# Patient Record
Sex: Male | Born: 1960 | Race: White | Hispanic: No | Smoking: Former smoker
Health system: Southern US, Community
[De-identification: ages and names within clinical notes are randomized; demographics above are authoritative.]

## PROBLEM LIST (undated history)

## (undated) DIAGNOSIS — R351 Nocturia: Secondary | ICD-10-CM

## (undated) DIAGNOSIS — G4730 Sleep apnea, unspecified: Secondary | ICD-10-CM

## (undated) DIAGNOSIS — E785 Hyperlipidemia, unspecified: Secondary | ICD-10-CM

## (undated) DIAGNOSIS — G8929 Other chronic pain: Secondary | ICD-10-CM

## (undated) DIAGNOSIS — M545 Low back pain, unspecified: Secondary | ICD-10-CM

## (undated) DIAGNOSIS — R310 Gross hematuria: Secondary | ICD-10-CM

## (undated) DIAGNOSIS — M1990 Unspecified osteoarthritis, unspecified site: Secondary | ICD-10-CM

## (undated) DIAGNOSIS — H547 Unspecified visual loss: Secondary | ICD-10-CM

## (undated) DIAGNOSIS — M549 Dorsalgia, unspecified: Secondary | ICD-10-CM

## (undated) DIAGNOSIS — N200 Calculus of kidney: Secondary | ICD-10-CM

## (undated) DIAGNOSIS — U099 Post covid-19 condition, unspecified: Secondary | ICD-10-CM

## (undated) DIAGNOSIS — R438 Other disturbances of smell and taste: Secondary | ICD-10-CM

## (undated) DIAGNOSIS — H8113 Benign paroxysmal vertigo, bilateral: Secondary | ICD-10-CM

## (undated) DIAGNOSIS — E782 Mixed hyperlipidemia: Secondary | ICD-10-CM

## (undated) DIAGNOSIS — I10 Essential (primary) hypertension: Secondary | ICD-10-CM

## (undated) DIAGNOSIS — R0602 Shortness of breath: Secondary | ICD-10-CM

## (undated) DIAGNOSIS — Z87442 Personal history of urinary calculi: Secondary | ICD-10-CM

## (undated) DIAGNOSIS — R9431 Abnormal electrocardiogram [ECG] [EKG]: Secondary | ICD-10-CM

## (undated) HISTORY — PX: KNEE SURGERY: SHX244

## (undated) HISTORY — PX: LITHOTRIPSY: SUR834

## (undated) HISTORY — DX: Unspecified visual loss: H54.7

## (undated) HISTORY — DX: Abnormal electrocardiogram (ECG) (EKG): R94.31

## (undated) HISTORY — PX: APPENDECTOMY: SHX54

## (undated) HISTORY — DX: Calculus of kidney: N20.0

## (undated) HISTORY — PX: WISDOM TOOTH EXTRACTION: SHX21

## (undated) HISTORY — DX: Hyperlipidemia, unspecified: E78.5

---

## 2005-03-13 ENCOUNTER — Ambulatory Visit: Payer: Self-pay | Admitting: Internal Medicine

## 2005-03-20 ENCOUNTER — Ambulatory Visit: Payer: Self-pay | Admitting: Internal Medicine

## 2005-04-06 ENCOUNTER — Ambulatory Visit: Payer: Self-pay | Admitting: Pulmonary Disease

## 2005-04-22 ENCOUNTER — Ambulatory Visit (HOSPITAL_BASED_OUTPATIENT_CLINIC_OR_DEPARTMENT_OTHER): Admission: RE | Admit: 2005-04-22 | Discharge: 2005-04-22 | Payer: Self-pay | Admitting: Pulmonary Disease

## 2005-04-27 ENCOUNTER — Ambulatory Visit: Payer: Self-pay | Admitting: Pulmonary Disease

## 2005-05-07 ENCOUNTER — Ambulatory Visit: Payer: Self-pay | Admitting: Pulmonary Disease

## 2005-08-06 DIAGNOSIS — Z8679 Personal history of other diseases of the circulatory system: Secondary | ICD-10-CM

## 2005-08-06 DIAGNOSIS — G4730 Sleep apnea, unspecified: Secondary | ICD-10-CM

## 2005-08-06 DIAGNOSIS — R9431 Abnormal electrocardiogram [ECG] [EKG]: Secondary | ICD-10-CM

## 2005-08-06 HISTORY — DX: Sleep apnea, unspecified: G47.30

## 2005-08-06 HISTORY — DX: Abnormal electrocardiogram (ECG) (EKG): R94.31

## 2005-08-06 HISTORY — DX: Personal history of other diseases of the circulatory system: Z86.79

## 2005-08-06 HISTORY — PX: CARDIAC CATHETERIZATION: SHX172

## 2006-07-27 ENCOUNTER — Ambulatory Visit: Payer: Self-pay | Admitting: Cardiology

## 2006-07-27 ENCOUNTER — Inpatient Hospital Stay (HOSPITAL_COMMUNITY): Admission: EM | Admit: 2006-07-27 | Discharge: 2006-07-27 | Payer: Self-pay | Admitting: Emergency Medicine

## 2006-08-07 ENCOUNTER — Ambulatory Visit: Payer: Self-pay | Admitting: Internal Medicine

## 2006-08-07 ENCOUNTER — Ambulatory Visit: Payer: Self-pay

## 2006-08-16 ENCOUNTER — Ambulatory Visit: Payer: Self-pay | Admitting: Internal Medicine

## 2006-08-19 ENCOUNTER — Ambulatory Visit: Payer: Self-pay | Admitting: Internal Medicine

## 2006-08-19 LAB — CONVERTED CEMR LAB
BUN: 12 mg/dL (ref 6–23)
Creatinine, Ser: 1.1 mg/dL (ref 0.4–1.5)
GFR calc non Af Amer: 78 mL/min
Glomerular Filtration Rate, Af Am: 94 mL/min/{1.73_m2}
INR: 1 (ref 0.9–2.0)
MCHC: 34.6 g/dL (ref 30.0–36.0)
MCV: 90.9 fL (ref 78.0–100.0)
Prothrombin Time: 12.8 s (ref 10.0–14.0)
RBC: 4.69 M/uL (ref 4.22–5.81)
RDW: 11.7 % (ref 11.5–14.6)
aPTT: 37 s — ABNORMAL HIGH (ref 26.5–36.5)

## 2006-08-22 ENCOUNTER — Ambulatory Visit (HOSPITAL_COMMUNITY): Admission: RE | Admit: 2006-08-22 | Discharge: 2006-08-22 | Payer: Self-pay | Admitting: Internal Medicine

## 2006-08-22 ENCOUNTER — Ambulatory Visit: Payer: Self-pay | Admitting: Internal Medicine

## 2006-08-23 ENCOUNTER — Encounter: Payer: Self-pay | Admitting: Internal Medicine

## 2006-08-23 ENCOUNTER — Inpatient Hospital Stay (HOSPITAL_BASED_OUTPATIENT_CLINIC_OR_DEPARTMENT_OTHER): Admission: RE | Admit: 2006-08-23 | Discharge: 2006-08-23 | Payer: Self-pay | Admitting: Internal Medicine

## 2006-08-23 HISTORY — PX: CARDIAC CATHETERIZATION: SHX172

## 2006-08-29 ENCOUNTER — Ambulatory Visit: Payer: Self-pay | Admitting: Pulmonary Disease

## 2007-11-29 ENCOUNTER — Emergency Department (HOSPITAL_COMMUNITY): Admission: EM | Admit: 2007-11-29 | Discharge: 2007-11-29 | Payer: Self-pay | Admitting: Emergency Medicine

## 2008-05-15 ENCOUNTER — Emergency Department (HOSPITAL_COMMUNITY): Admission: EM | Admit: 2008-05-15 | Discharge: 2008-05-15 | Payer: Self-pay | Admitting: Emergency Medicine

## 2008-09-27 ENCOUNTER — Telehealth: Payer: Self-pay | Admitting: Internal Medicine

## 2008-09-29 ENCOUNTER — Ambulatory Visit (HOSPITAL_BASED_OUTPATIENT_CLINIC_OR_DEPARTMENT_OTHER): Admission: RE | Admit: 2008-09-29 | Discharge: 2008-09-29 | Payer: Self-pay | Admitting: Internal Medicine

## 2008-09-29 ENCOUNTER — Telehealth: Payer: Self-pay | Admitting: Internal Medicine

## 2008-09-29 ENCOUNTER — Ambulatory Visit: Payer: Self-pay | Admitting: Internal Medicine

## 2008-09-29 ENCOUNTER — Ambulatory Visit: Payer: Self-pay | Admitting: Diagnostic Radiology

## 2008-09-29 DIAGNOSIS — E785 Hyperlipidemia, unspecified: Secondary | ICD-10-CM | POA: Insufficient documentation

## 2008-09-29 DIAGNOSIS — R51 Headache: Secondary | ICD-10-CM | POA: Insufficient documentation

## 2008-09-29 DIAGNOSIS — G4733 Obstructive sleep apnea (adult) (pediatric): Secondary | ICD-10-CM | POA: Insufficient documentation

## 2008-09-29 DIAGNOSIS — R519 Headache, unspecified: Secondary | ICD-10-CM | POA: Insufficient documentation

## 2008-09-30 ENCOUNTER — Ambulatory Visit: Payer: Self-pay | Admitting: Family Medicine

## 2008-09-30 LAB — CONVERTED CEMR LAB
ALT: 59 units/L — ABNORMAL HIGH (ref 0–53)
AST: 40 units/L — ABNORMAL HIGH (ref 0–37)
BUN: 16 mg/dL (ref 6–23)
Basophils Absolute: 0 10*3/uL (ref 0.0–0.1)
Calcium: 9.1 mg/dL (ref 8.4–10.5)
Creatinine, Ser: 1.1 mg/dL (ref 0.4–1.5)
Eosinophils Relative: 6.6 % — ABNORMAL HIGH (ref 0.0–5.0)
GFR calc Af Amer: 93 mL/min
GFR calc non Af Amer: 77 mL/min
Glucose, Bld: 85 mg/dL (ref 70–99)
HDL: 31.4 mg/dL — ABNORMAL LOW (ref >39.0)
Lymphocytes Relative: 33.9 % (ref 12.0–46.0)
MCHC: 34.5 g/dL (ref 30.0–36.0)
Monocytes Relative: 7.3 % (ref 3.0–12.0)
Neutro Abs: 2.5 10*3/uL (ref 1.4–7.7)
Neutrophils Relative %: 51.5 % (ref 43.0–77.0)
Platelets: 189 10*3/uL (ref 150–400)
RBC: 4.5 M/uL (ref 4.22–5.81)
TSH: 1.72 microintl units/mL (ref 0.35–5.50)
Triglycerides: 150 mg/dL — ABNORMAL HIGH (ref 0–149)

## 2008-10-01 ENCOUNTER — Encounter: Payer: Self-pay | Admitting: Internal Medicine

## 2009-12-18 ENCOUNTER — Emergency Department (HOSPITAL_COMMUNITY): Admission: EM | Admit: 2009-12-18 | Discharge: 2009-12-18 | Payer: Self-pay | Admitting: Emergency Medicine

## 2010-12-22 NOTE — H&P (Signed)
NAME:  ARMANY, MANO NO.:  [id number]   MEDICAL RECORD NO.:  [id number]          PATIENT TYPE:  EMS   LOCATION:  MAJO                         FACILITY:  [facility]   PHYSICIAN:  Learta Codding, MD,FACC DATE OF BIRTH:  12-Dec-1960   DATE OF ADMISSION:  07/27/2006  DATE OF DISCHARGE:                              HISTORY & PHYSICAL   CHIEF COMPLAINT:  Chest pain.   HISTORY OF PRESENT ILLNESS:  Mr. Mells is a 50 year old Caucasian male  with a positive family history for premature coronary artery disease,  who presents with 4-5 hours of sharp substernal chest pain.  The patient  denies any history of chest pain.  Tonight, he was arguing with his  girlfriend when he developed 3/10 sharp chest pain at the left sternal  border with no radiation.  He denied any shortness of breath, dyspnea on  exertion or diaphoresis.  The pain waxed and waned every hour for  approximately 4 or 5 hours, prompting him to present to the emergency  room.  The pain resolved spontaneously and was not affected by the IV  nitroglycerin.  The patient also was started on heparin.  He denies any  orthopnea or paroxysmal nocturnal dyspnea.   PAST MEDICAL HISTORY:  1. Sleep apnea.  2. COPD.  3. History of palpitations.   ALLERGIES:  No known drug allergies.   MEDICATIONS:  Vitamins.   SOCIAL HISTORY:  The patient lives in [place].  Occupation:  He is an  Art gallery manager.  He has a girlfriend.  He quit smoking in 1987, having smoked  1 pack a day.   FAMILY HISTORY:  Notable for mother who expired of CHF and a brother who  requires a stent placement at age 52.   REVIEW OF SYSTEMS:  Notable for mild sleep apnea.  The rest of the 12  review of systems were reviewed and were negative.   PHYSICAL EXAM:  VITAL SIGNS:  Temperature is 97.6, pulse 66, respiratory  rate of 16 and blood pressure 116/58, saturating 97% on room air.  GENERAL:  The patient is awake, alert and oriented x3, in no acute   distress.  HEENT:  Normocephalic, atraumatic.  Pupils are equal, round and reactive  to light.  Extraocular movements are intact.  NECK:  No JVD.  No carotid bruit.  CARDIOVASCULAR:  Regular rhythm.  Normal rate.  No murmurs, rubs, or  gallops.  LUNGS:  Clear to auscultation bilaterally with mild diffusely decreased  breath sounds secondary to body habitus.  ABDOMEN:  Positive bowel sounds.  Soft, nontender and non-distended.  EXTREMITIES:  No cyanosis, clubbing or edema.  There are about 1+ distal  pulses.  PSYCHIATRIC:  Normal affect.  MUSCULOSKELETAL:  No significant effusion or tenderness.  NEUROLOGIC:  Cranial nerves II-XII are grossly intact with no focal  musculoskeletal or sensory deficits.   LABORATORY AND ACCESSORY CLINICAL DATA:  Chest x-ray demonstrates no  cardiopulmonary process.   EKG demonstrates normal sinus rhythm with a heart rate of 64, no ST-T  wave changes.   Labs demonstrate a white count of 4.9, a hemoglobin  of 14.4, creatinine  of 1.1, a troponin of less than 0.05 x2.   ASSESSMENT AND PLAN:  This is a 50 year old Caucasian male with a  positive family history, but with atypical chest pain.   1. We will rule the patient out for a myocardial infarction and start      on low-dose beta blocker.  2. We will consider stress test, either as an inpatient versus      outpatient.      Reginia Forts, MD   Electronically Signed     ______________________________  Learta Codding, MD,FACC    RA/MEDQ  D:  07/27/2006  T:  07/27/2006  Job:  [id number]

## 2010-12-22 NOTE — Cardiovascular Report (Signed)
NAME:  [name], [name] NO.:  [id number]   MEDICAL RECORD NO.:  [id number]          PATIENT TYPE:  OIB   LOCATION:  1965                         FACILITY:  [facility]   PHYSICIAN:  Bevelyn Buckles. Bensimhon, MDDATE OF BIRTH:  09-26-1960   DATE OF PROCEDURE:  08/23/2006  DATE OF DISCHARGE:                            CARDIAC CATHETERIZATION   PRIMARY CARE PHYSICIAN:  Barbette Hair. Artist Pais, DO.   CARDIOLOGISTBevelyn Buckles. Bensimhon, M.D.   PATIENT IDENTIFICATION:  Nicholas Horn is a 50 year old male with a history  of obesity and hyperlipidemia.  He had an episode of chest pain a few  weeks ago.  He then underwent an exercise treadmill test which showed an  EF of 58% with normal perfusion.  However, there was significant ST  depression in inferolateral leads.  Given his symptoms and positive EKG,  discussed with him the possibility proceeding with heart catheterization  versus just continue with medical therapy and with the thought that this  might be a false positive.  He has elected to go with catheterization.  This was performed in the outpatient lab.   PROCEDURES PERFORMED:  1. Selective coronary angiography.  2. Left heart cath.  3. Left ventriculogram.   DESCRIPTION OF PROCEDURE:  Risks and benefits were explained.  Consent  was signed and placed on the chart.  A 4-French arterial sheath was  placed in the right femoral artery.  Standard catheters including a JL-  4, 3-DRC, and angled pigtail were used for procedure.  All catheter  exchanges made over wire.  There were no apparent complications.   Central aortic pressure was 113/74 with a mean of 93.  LV pressure 119/5  with an EDP of 16.  There was no aortic stenosis on pullback.   Left main:  Normal.   LAD:  A long vessel wrapping the apex, gave off large branching proximal  diagonal which was normal.   Left circumflex:  Made up primarily of a large branching OM-1.  It was  angiographically normal.   Right coronary was a  large dominant vessel.  It gave off small to  moderate size PDA and three posterolateral branches.  It was  angiographically normal.   LEFT VENTRICULOGRAM:  Done in the RAO position, showed an EF of 60%, no  wall motion abnormalities, no mitral regurgitation.   ASSESSMENT:  1. Normal coronary arteries.  2. Normal left ventricular function.  3. False positive exercise EKG.   PLAN:  We will continue with risk factor management.      Bevelyn Buckles. Bensimhon, MD  Electronically Signed     DRB/MEDQ  D:  08/23/2006  T:  08/23/2006  Job:  [id number]   cc:   Barbette Hair. Artist Pais, DO

## 2010-12-22 NOTE — Procedures (Signed)
NAME:  Nicholas Horn, Nicholas Horn NO.:  [id number]   MEDICAL RECORD NO.:  [id number]          PATIENT TYPE:  OUT   LOCATION:  SLEEP CENTER                 FACILITY:  [facility]   PHYSICIAN:  Marcelyn Bruins, M.D. [facility] DATE OF BIRTH:  05/12/1961   DATE OF STUDY:  04/22/2005                              NOCTURNAL POLYSOMNOGRAM   INDICATION FOR STUDY:  Hypersomnia with sleep apnea.   EPWORTH SLEEPINESS SCORE:  10.   SLEEP ARCHITECTURE:  The patient had a total sleep time of 382 minutes with  decreased slow wave sleep but adequate REM.  Sleep onset latency was normal  and REM onset was fairly rapid at 54 minutes. Sleep efficiency was 93%.   RESPIRATORY DATA:  The patient was found to have a respiratory disturbance  index of 2.7 events per hour which clearly were worse during supine and also  REM. There was loud snoring with 143 nonspecific arousals. This could be  consistent with the upper airway resistant syndrome. Clinical correlation is  suggested.   OXYGEN DATA:  The patient had O2 desaturation as low as 87% associated with  his obstructive events.   CARDIAC DATA:  There were no clinically significant cardiac arrhythmias.   MOVEMENT-PARASOMNIA:  The patient was found to have 72 leg jerks with three  per hour resulting in arousal or awakening. Clinical correlation is  suggested.   IMPRESSIONS-RECOMMENDATIONS:  1.  Small numbers of obstructive events which do not meet the respiratory      disturbance index criteria for the obstructive sleep apnea syndrome.      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  2.  Moderate numbers of leg jerks with mild to moderate sleep disruption.      Clinical correlation is suggested.           ______________________________  Marcelyn Bruins, M.D. [facility]  Diplomate, American Board of Sleep  Medicine     KC/MEDQ  D:  04/27/2005 11:09:42  T:   04/27/2005 21:14:41  Job:  [id number]

## 2010-12-22 NOTE — Assessment & Plan Note (Signed)
[facility]                            CARDIOLOGY OFFICE NOTE   INMAN, FETTIG                        MRN:          [id number]  DATE:08/16/2006                            DOB:          1961/02/12    PRIMARY CARE PHYSICIAN:  Barbette Hair. Artist Pais, DO.   PATIENT IDENTIFICATION:  Nicholas Horn is a 50 year old male with a family  history of coronary artery disease who presents for follow up to discuss  the results of his stress testing.   PROBLEM LIST:  1. Episode of chest pain evaluated in the hospital, ruled out with      serial cardiac markers.      a.     Exercise treadmill test August 07, 2006.  The patient walked       for 10 minutes on a standard Bruce protocol.  There was a       hypertensive response to exercise.  No chest pain.  EF was 58%       with no perfusion abnormalities; however, there was significant ST       depression inferolaterally.  2. Palpitations.      a.     A 48-hour Holter monitor January of 2008.  It shows normal       sinus rhythm with no arrhythmias.  3. Obesity.  4. Hyperlipidemia.   CURRENT MEDICATIONS:  1. Simvastatin 20 a day.  2. Toprol XL 25 a day.  3. Multivitamin.   INTERVAL HISTORY:  Nicholas Horn returns today for routine followup.  He  tells me that he has done quite well since discharge.  No further chest  pain.  He ambulates without any difficulty.  He said that about six  months ago he was working out religiously on an Manufacturing engineer but  due to his work schedule he is not able to do this.  He has never had  exertional symptoms.   PHYSICAL EXAMINATION:  Well-appearing, no acute distress.  Ambulates  around the clinic without any respiratory difficulty.  No chest pain.  Blood pressure is 112/64, heart rate 68.  Weight is 298.  HEENT:  Sclerae anicteric.  EOMI.  There is no xanthelasma.  Oropharynx  is clear.  Moist mucus membranes.  Neck is supple.  No JVD.  Carotids  are 2+ bilaterally without any  bruits.  There is no lymphadenopathy or  thyromegaly.  CARDIAC:  Regular rate and rhythm.  No murmurs, gallops, or rubs.  LUNGS:  Clear.  ABDOMEN:  Obese, soft, nontender, nondistended.  No hepatosplenomegaly,  no bruits, no masses.  EXTREMITIES:  Warm with no clubbing, cyanosis, or edema.  Good distal  pulses.  NEUROLOGICAL:  Alert and oriented x3.  Cranial nerves II through XII are  intact.  Moves all four extremities without difficulty.   ASSESSMENT/PLAN:  Chest pain:  This is somewhat of a diagnostic dilemma:  Clinically, I do not think he has significant angina.  He has an  excellent exercise tolerance on his stress test without any exertional  chest pain and perfusing imaging is normal.  However, he does have  positive EKG.  This likely represents either false-positive EKG or  balanced ischemia on his perfusion scan.  Given his age and lack of risk  factors, I think it is probably the former.  The only co-founding factor  is that he does have a brother that has premature coronary artery  disease, though this is in the setting of significant tobacco and drug  use.  We have discussed the possibilities of just proceeding with  medical therapy, catheterization or cardiac CT.  He would like to  consider this.  We have given him some information on catheterization  and he will get back to [place] on Monday.  I told him that my gut feeling  was that his arteries were likely okay.   DISPOSITION:  Pending his decision regarding catheterization.  I did  recommend that he would be safe to resume exercise program, although to  begin back up a low level.     Nicholas Buckles. Bensimhon, MD  Electronically Signed    DRB/MedQ  DD: 08/16/2006  DT: 08/17/2006  Job #: [id number]

## 2010-12-22 NOTE — Discharge Summary (Signed)
Nicholas Horn, Nicholas Horn NO.:  [id number]   MEDICAL RECORD NO.:  [id number]          PATIENT TYPE:  INP   LOCATION:  2028                         FACILITY:  [facility]   PHYSICIAN:  Bevelyn Buckles. Bensimhon, MDDATE OF BIRTH:  11/10/1960   DATE OF ADMISSION:  07/27/2006  DATE OF DISCHARGE:  07/27/2006                               DISCHARGE SUMMARY   CARDIOLOGIST:  He is new to Dr. Arvilla Meres.   PRIMARY CARE PHYSICIAN:  Barbette Hair. Artist Pais, M.D.   REASON FOR ADMISSION:  Chest pain.   DISCHARGE DIAGNOSES:  1. Chest pain, etiology unclear.  2. Hypertension.  3. Hyperlipidemia.  4. Obesity.  5. Palpitations.   HISTORY:  Nicholas Horn is a 50 year old  male patient with a family history  of premature coronary disease who presented with 4-5 hours of substernal  chest pain.  He developed more chest pain with arguing with his  girlfriend.  There was no shortness of breath or diaphoresis.  He  decided to come to the emergency room for further evaluation.   HOSPITAL COURSE:  The patient was admitted for observation and treated  with nitroglycerin and heparin.  The initial cardiac markers were  negative.  Followup cardiac markers are still pending at time of this  dictation.  As long as these are negative, he will be discharged home  later today.  Dr. Gala Romney saw the patient on December 22.  The patient  was having no further chest pain.  Dr. Gala Romney, as noted above, felt  that if the patient's cardiac markers remained negative, he could be  discharged home.  He will need workup for his palpitations and chest  pain with an outpatient stress Myoview study as well as a 48-hour Holter  monitor and then follow up with Dr. Gala Romney.   LABORATORY AND ANCILLARY DATA:  White count was 4900, hemoglobin 14.4,  hematocrit 42, platelet 199,000. INR 0.9. D-dimer 1.21. Sodium 140,  potassium 3.6, BUN 15, creatinine 1.0, glucose 98. CK-MB 1.4, troponin I  less than 0.01. Total cholesterol  234, triglycerides 92, HDL 36, LDL  180.   Chest x-ray: No acute cardiopulmonary disease.   DISCHARGE MEDICATIONS:  1. Aspirin 0 mg daily.  2. Zocor 20 mg nightly.  3. Toprol XL 25 mg daily.  4. Multivitamins.  5. Nitroglycerin p.r.n. chest pain.   DIET:  Low-fat, low-sodium diet.   ACTIVITIES:  Increase activity slowly.   FOLLOWUP:  1. The patient be set up for a stress Myoview study. The office will      contact him with that appointment.  2  He will be set up for an outpatient 48-hour Holter monitor,and the  office will contact him with an appointment.  1. He will see Dr. Gala Romney in the next 2-4 weeks, and the office      will contact him with appointment.   Total physician and PA time greater than 30 minutes on this discharge.      Tereso Newcomer, PA-C      Bevelyn Buckles. Bensimhon, MD  Electronically Signed    SW/MEDQ  D:  07/27/2006  T:  07/28/2006  Job:  [id number]   cc:   Barbette Hair. Artist Pais, DO

## 2012-05-23 ENCOUNTER — Encounter: Payer: Self-pay | Admitting: Medical

## 2012-05-23 ENCOUNTER — Ambulatory Visit (INDEPENDENT_AMBULATORY_CARE_PROVIDER_SITE_OTHER): Payer: BC Managed Care – PPO | Admitting: Medical

## 2012-05-23 ENCOUNTER — Telehealth: Payer: Self-pay | Admitting: Medical

## 2012-05-23 VITALS — BP 122/82 | HR 84 | Temp 97.6°F | Resp 16 | Ht 75.0 in | Wt 281.0 lb

## 2012-05-23 DIAGNOSIS — R42 Dizziness and giddiness: Secondary | ICD-10-CM

## 2012-05-23 DIAGNOSIS — H8110 Benign paroxysmal vertigo, unspecified ear: Secondary | ICD-10-CM

## 2012-05-23 DIAGNOSIS — B351 Tinea unguium: Secondary | ICD-10-CM

## 2012-05-23 LAB — BASIC METABOLIC PANEL
BUN: 12 mg/dL (ref 6–23)
CO2: 27 mEq/L (ref 19–32)
Calcium: 9.1 mg/dL (ref 8.4–10.5)
Chloride: 106 mEq/L (ref 96–112)

## 2012-05-23 LAB — CBC WITH DIFFERENTIAL/PLATELET
Basophils Relative: 1 % (ref 0–1)
HCT: 39 % (ref 39.0–52.0)
Hemoglobin: 13.9 g/dL (ref 13.0–17.0)
Monocytes Absolute: 0.6 10*3/uL (ref 0.1–1.0)
Monocytes Relative: 9 % (ref 3–12)
Neutro Abs: 3.1 10*3/uL (ref 1.7–7.7)
Neutrophils Relative %: 52 % (ref 43–77)
WBC: 6 10*3/uL (ref 4.0–10.5)

## 2012-05-23 MED ORDER — MECLIZINE HCL 25 MG PO TABS: 25.0000 mg | ORAL_TABLET | Freq: Three times a day (TID) | ORAL | Status: DC | PRN

## 2012-05-23 MED ORDER — TERBINAFINE HCL 250 MG PO TABS: 250.0000 mg | ORAL_TABLET | Freq: Every day | ORAL | Status: DC

## 2012-05-23 NOTE — Patient Instructions (Signed)
Benign Positional Vertigo Vertigo means you feel like you or your surroundings are moving when they are not. Benign positional vertigo is the most common form of vertigo. Benign means that the cause of your condition is not serious. Benign positional vertigo is more common in older adults. CAUSES  Benign positional vertigo is the result of an upset in the labyrinth system. This is an area in the middle ear that helps control your balance. This may be caused by a viral infection, head injury, or repetitive motion. However, often no specific cause is found. SYMPTOMS  Symptoms of benign positional vertigo occur when you move your head or eyes in different directions. Some of the symptoms may include:  Loss of balance and falls.  Vomiting.  Blurred vision.  Dizziness.  Nausea.  Involuntary eye movements (nystagmus). DIAGNOSIS  Benign positional vertigo is usually diagnosed by physical exam. If the specific cause of your benign positional vertigo is unknown, your caregiver may perform imaging tests, such as magnetic resonance imaging (MRI) or computed tomography (CT). TREATMENT  Your caregiver may recommend movements or procedures to correct the benign positional vertigo. Medicines such as meclizine, benzodiazepines, and medicines for nausea may be used to treat your symptoms. In rare cases, if your symptoms are caused by certain conditions that affect the inner ear, you may need surgery. HOME CARE INSTRUCTIONS   Follow your caregiver's instructions.  Move slowly. Do not make sudden body or head movements.  Avoid driving.  Avoid operating heavy machinery.  Avoid performing any tasks that would be dangerous to you or others during a vertigo episode.  Drink enough fluids to keep your urine clear or pale yellow. SEEK IMMEDIATE MEDICAL CARE IF:   You develop problems with walking, weakness, numbness, or using your arms, hands, or legs.  You have difficulty speaking.  You develop  severe headaches.  Your nausea or vomiting continues or gets worse.  You develop visual changes.  Your family or friends notice any behavioral changes.  Your condition gets worse.  You have a fever.  You develop a stiff neck or sensitivity to light. MAKE SURE YOU:   Understand these instructions.  Will watch your condition.  Will get help right away if you are not doing well or get worse. Document Released: 04/30/2006 Document Revised: 10/15/2011 Document Reviewed: 04/12/2011 [facility] Patient Information 2013 [place], [place].   Ringworm, Nail A fungal infection of the nail (tinea unguium/onychomycosis) is common. It is common as the visible part of the nail is composed of dead cells which have no blood supply to help prevent infection. It occurs because fungi are everywhere and will pick any opportunity to grow on any dead material. Because nails are very slow growing they require up to 2 years of treatment with anti-fungal medications. The entire nail back to the base is infected. This includes approximately  of the nail which you cannot see. If your caregiver has prescribed a medication by mouth, take it every day and as directed. No progress will be seen for at least 6 to 9 months. Do not be disappointed! Because fungi live on dead cells with little or no exposure to blood supply, medication delivery to the infection is slow; thus the cure is slow. It is also why you can observe no progress in the first 6 months. The nail becoming cured is the base of the nail, as it has the blood supply. Topical medication such as creams and ointments are usually not effective. Important in successful treatment of  nail fungus is closely following the medication regimen that your doctor prescribes. Sometimes you and your caregiver may elect to speed up this process by surgical removal of all the nails. Even this may still require 6 to 9 months of additional oral medications. See your caregiver as  directed. Remember there will be no visible improvement for at least 6 months. See your caregiver sooner if other signs of infection (redness and swelling) develop. Document Released: 07/20/2000 Document Revised: 10/15/2011 Document Reviewed: 09/28/2008 [facility] Patient Information 2013 [place], [place].

## 2012-05-23 NOTE — Progress Notes (Signed)
Subjective: Here as a new patient today.   He gives blood routinely for donation, and they normally take double units due to him being O- blood type.   Usually doesn't have a problem with this, but 2 wk ago gave blood.  The next morning felt dizzy and he has remained dizzy since.  He notes that his equilibrium feels off.  Sometimes room seems to spin, sometimes just feels off balance.   Denies syncope feeling.   He exercises regularly, goes to the gym, took a week off after the dizzy spell, but this week he felt fine during exercise without dizziness.  Just felt more fatigue than usual.  Also c/o left great toenail fungus.  Had thickened yellow nail for long time.   No other nail issues.  Tried OTC creams with no success.  In general he has lost a lot of weight in the last year through diet and exercise.   Was 320 lb a year ago.   Past Medical History  Diagnosis Date  . Vision impairment     wears reading glasses  . EKG abnormality 2007    stress test and cardiac evaluation - [facility]    ROS Gen: no fever, chills, sweats, no recent weight changes. Skin: no rash HEENT: negative Heart: no CP, palpitation, no edema Lungs: negative GI: negative, no pain, NVD, no blood, no constipation GU: negative Neuro: mild low intensity headaches the last few weeks.  no numbness, tingling, weakness, slurred speech, vision changes.   Objective:   Physical Exam  Filed Vitals:   05/23/12 0945  BP: 122/82  Pulse: 84  Temp: 97.6 F (36.4 C)  Resp: 16    General appearance: alert, no distress, WD/WN Skin: left great toenail with yellowish coloration, cut down to 3/4 of normal length, thickened crusting nail, rest of nails appear normal HEENT: normocephalic, sclerae anicteric, PERRLA, EOMi, nares patent, no discharge or erythema, pharynx normal Oral cavity: MMM, no lesions Neck: supple, no lymphadenopathy, no thyromegaly, no masses, no bruits Heart: RRR, normal S1, S2, no murmurs Lungs:  CTA bilaterally, no wheezes, rhonchi, or rales Extremities: no edema, no cyanosis, no clubbing Pulses: 2+ symmetric Neurological: alert, oriented x 3, CN2-12 intact, strength normal upper extremities and lower extremities, sensation normal throughout, DTRs 2+ throughout, no cerebellar signs, gait normal  Assessment and Plan :    Encounter Diagnoses  Name Primary?  Marland Kitchen BPPV (benign paroxysmal positional vertigo) Yes  . Dizziness   . OM (onychomycosis)    BPPV - trial of Meclizine, discussed precautions  Dizziness - most likely vertigo, but labs today for CBC and BMET today  Onychomycosis - begin Lamisil.  discussed diagnosis, treatment, risks, f/u in 6wk.  Follow-up pending labs

## 2012-05-28 NOTE — Telephone Encounter (Signed)
FYI

## 2012-06-09 ENCOUNTER — Ambulatory Visit (INDEPENDENT_AMBULATORY_CARE_PROVIDER_SITE_OTHER): Payer: BC Managed Care – PPO | Admitting: Medical

## 2012-06-09 ENCOUNTER — Encounter: Payer: Self-pay | Admitting: Medical

## 2012-06-09 VITALS — BP 118/80 | HR 60 | Temp 98.0°F | Resp 16 | Ht 76.0 in | Wt 280.0 lb

## 2012-06-09 DIAGNOSIS — R9431 Abnormal electrocardiogram [ECG] [EKG]: Secondary | ICD-10-CM

## 2012-06-09 DIAGNOSIS — E669 Obesity, unspecified: Secondary | ICD-10-CM

## 2012-06-09 DIAGNOSIS — Z0000 Encounter for general adult medical examination without abnormal findings: Secondary | ICD-10-CM

## 2012-06-09 DIAGNOSIS — B351 Tinea unguium: Secondary | ICD-10-CM

## 2012-06-09 DIAGNOSIS — Z1211 Encounter for screening for malignant neoplasm of colon: Secondary | ICD-10-CM

## 2012-06-09 LAB — POCT URINALYSIS DIPSTICK
Bilirubin, UA: NEGATIVE
Ketones, UA: NEGATIVE
Leukocytes, UA: NEGATIVE
Nitrite, UA: NEGATIVE
Urobilinogen, UA: NEGATIVE
pH, UA: 7

## 2012-06-09 MED ORDER — TERBINAFINE HCL 250 MG PO TABS: 250.0000 mg | ORAL_TABLET | Freq: Every day | ORAL | Status: DC

## 2012-06-09 NOTE — Patient Instructions (Signed)
Preventative Care for Adults, Male       REGULAR HEALTH EXAMS:  A routine yearly physical is a good way to check in with your primary care provider about your health and preventive screening. It is also an opportunity to share updates about your health and any concerns you have, and receive a thorough all-over exam.   Most health insurance companies pay for at least some preventative services.  Check with your health plan for specific coverages.  WHAT PREVENTATIVE SERVICES DO MEN NEED?  Adult men should have their weight and blood pressure checked regularly.   Men age 35 and older should have their cholesterol levels checked regularly.  Beginning at age 50 and continuing to age 75, men should be screened for colorectal cancer.  Certain people should may need continued testing until age 85.  Other cancer screening may include exams for testicular and prostate cancer.  Updating vaccinations is part of preventative care.  Vaccinations help protect against diseases such as the flu.  Lab tests are generally done as part of preventative care to screen for anemia and blood disorders, to screen for problems with the kidneys and liver, to screen for bladder problems, to check blood sugar, and to check your cholesterol level.  Preventative services generally include counseling about diet, exercise, avoiding tobacco, drugs, excessive alcohol consumption, and sexually transmitted infections.    GENERAL RECOMMENDATIONS FOR GOOD HEALTH:  Healthy diet:  Eat a variety of foods, including fruit, vegetables, animal or vegetable protein, such as meat, fish, chicken, and eggs, or beans, lentils, tofu, and grains, such as rice.  Drink plenty of water daily.  Decrease saturated fat in the diet, avoid lots of red meat, processed foods, sweets, fast foods, and fried foods.  Exercise:  Aerobic exercise helps maintain good heart health. At least 30-40 minutes of moderate-intensity exercise is recommended.  For example, a brisk walk that increases your heart rate and breathing. This should be done on most days of the week.   Find a type of exercise or a variety of exercises that you enjoy so that it becomes a part of your daily life.  Examples are running, walking, swimming, water aerobics, and biking.  For motivation and support, explore group exercise such as aerobic class, spin class, Zumba, Yoga,or  martial arts, etc.    Set exercise goals for yourself, such as a certain weight goal, walk or run in a race such as a 5k walk/run.  Speak to your primary care provider about exercise goals.  Disease prevention:  If you smoke or chew tobacco, find out from your caregiver how to quit. It can literally save your life, no matter how long you have been a tobacco user. If you do not use tobacco, never begin.   Maintain a healthy diet and normal weight. Increased weight leads to problems with blood pressure and diabetes.   The Body Mass Index or BMI is a way of measuring how much of your body is fat. Having a BMI above 27 increases the risk of heart disease, diabetes, hypertension, stroke and other problems related to obesity. Your caregiver can help determine your BMI and based on it develop an exercise and dietary program to help you achieve or maintain this important measurement at a healthful level.  High blood pressure causes heart and blood vessel problems.  Persistent high blood pressure should be treated with medicine if weight loss and exercise do not work.   Fat and cholesterol leaves deposits in your arteries   that can block them. This causes heart disease and vessel disease elsewhere in your body.  If your cholesterol is found to be high, or if you have heart disease or certain other medical conditions, then you may need to have your cholesterol monitored frequently and be treated with medication.   Ask if you should have a stress test if your history suggests this. A stress test is a test done on  a treadmill that looks for heart disease. This test can find disease prior to there being a problem.  Avoid drinking alcohol in excess (more than two drinks per day).  Avoid use of street drugs. Do not share needles with anyone. Ask for professional help if you need assistance or instructions on stopping the use of alcohol, cigarettes, and/or drugs.  Brush your teeth twice a day with fluoride toothpaste, and floss once a day. Good oral hygiene prevents tooth decay and gum disease. The problems can be painful, unattractive, and can cause other health problems. Visit your dentist for a routine oral and dental check up and preventive care every 6-12 months.   Look at your skin regularly.  Use a mirror to look at your back. Notify your caregivers of changes in moles, especially if there are changes in shapes, colors, a size larger than a pencil eraser, an irregular border, or development of new moles.  Safety:  Use seatbelts 100% of the time, whether driving or as a passenger.  Use safety devices such as hearing protection if you work in environments with loud noise or significant background noise.  Use safety glasses when doing any work that could send debris in to the eyes.  Use a helmet if you ride a bike or motorcycle.  Use appropriate safety gear for contact sports.  Talk to your caregiver about gun safety.  Use sunscreen with a SPF (or skin protection factor) of 15 or greater.  Lighter skinned people are at a greater risk of skin cancer. Don't forget to also wear sunglasses in order to protect your eyes from too much damaging sunlight. Damaging sunlight can accelerate cataract formation.   Practice safe sex. Use condoms. Condoms are used for birth control and to help reduce the spread of sexually transmitted infections (or STIs).  Some of the STIs are gonorrhea (the clap), chlamydia, syphilis, trichomonas, herpes, HPV (human papilloma virus) and HIV (human immunodeficiency virus) which causes AIDS.  The herpes, HIV and HPV are viral illnesses that have no cure. These can result in disability, cancer and death.   Keep carbon monoxide and smoke detectors in your home functioning at all times. Change the batteries every 6 months or use a model that plugs into the wall.   Vaccinations:  Stay up to date with your tetanus shots and other required immunizations. You should have a booster for tetanus every 10 years. Be sure to get your flu shot every year, since 5%-20% of the [place] population comes down with the flu. The flu vaccine changes each year, so being vaccinated once is not enough. Get your shot in the fall, before the flu season peaks.   Other vaccines to consider:  Pneumococcal vaccine to protect against certain types of pneumonia.  This is normally recommended for adults age 65 or older.  However, adults younger than 49 years old with certain underlying conditions such as diabetes, heart or lung disease should also receive the vaccine.  Shingles vaccine to protect against Varicella Zoster if you are older than age 60, or younger   than 49 years old with certain underlying illness.  Hepatitis A vaccine to protect against a form of infection of the liver by a virus acquired from food.  Hepatitis B vaccine to protect against a form of infection of the liver by a virus acquired from blood or body fluids, particularly if you work in health care.  If you plan to travel internationally, check with your local health department for specific vaccination recommendations.  Cancer Screening:  Most routine colon cancer screening begins at the age of 50. On a yearly basis, doctors may provide special easy to use take-home tests to check for hidden blood in the stool. Sigmoidoscopy or colonoscopy can detect the earliest forms of colon cancer and is life saving. These tests use a small camera at the end of a tube to directly examine the colon. Speak to your caregiver about this at age 50, when routine  screening begins (and is repeated every 5 years unless early forms of pre-cancerous polyps or small growths are found).   At the age of 50 men usually start screening for prostate cancer every year. Screening may begin at a younger age for those with higher risk. Those at higher risk include African-Americans or having a family history of prostate cancer. There are two types of tests for prostate cancer:   Prostate-specific antigen (PSA) testing. Recent studies raise questions about prostate cancer using PSA and you should discuss this with your caregiver.   Digital rectal exam (in which your doctor's lubricated and gloved finger feels for enlargement of the prostate through the anus).   Screening for testicular cancer.  Do a monthly exam of your testicles. Gently roll each testicle between your thumb and fingers, feeling for any abnormal lumps. The best time to do this is after a hot shower or bath when the tissues are looser. Notify your caregivers of any lumps, tenderness or changes in size or shape immediately.     

## 2012-06-09 NOTE — Progress Notes (Addendum)
Subjective:   HPI  Nicholas Horn is a 51 y.o. male who presents for a complete physical.  I saw him as a new patient recently for vertigo and nail fungus.  Vertigo resolved on its own.   He forgot to go and get the toenail fungus medication.   Preventative care: Last ophthalmology visit:June/ 2013 Last dental visit:07/05/12 Last colonoscopy:n/a Last prostate exam: 7 years ago Last EKG: hx/o abnormal EKG with subsequent cath 2007 that was normal per pt Last labs:3 weeks ago   Prior vaccinations: TD or Tdap:11/29/2007 Influenza:05/2012 Pneumococcal:07/27/2006 Shingles/Zostavax:n/a   Reviewed their medical, surgical, family, social, medication, and allergy history and updated chart as appropriate.   Past Medical History  Diagnosis Date  . Vision impairment     wears reading glasses  . EKG abnormality 2007    stress test and cardiac evaluation -  cardiology    Past Surgical History  Procedure Date  . Cardiac catheterization 2007    due to equivocal stress test.  normal cath per pt report  . Wisdom tooth extraction     age 89yo    Family History  Problem Relation Age of Onset  . Heart disease Mother 73    died of heart disease, age 71yo  . Obesity Mother   . Hypertension Mother   . Hyperlipidemia Mother   . Cancer Father     died of stomach and intestinal cancer  . Heart disease Brother     CAD, stent  . Other Brother     substance abuse  . Diabetes Neg Hx   . Stroke Neg Hx     History   Social History  . Marital Status: Divorced    Spouse Name: N/A    Number of Children: N/A  . Years of Education: N/A   Occupational History  . maintensance supervisor    Social History Main Topics  . Smoking status: Former Smoker -- 1.0 packs/day for 12 years    Types: Cigarettes    Start date: 09/06/1985  . Smokeless tobacco: Not on file  . Alcohol Use: No  . Drug Use: No  . Sexually Active: Not on file   Other Topics Concern  . 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    Allergies  Allergen Reactions  . Honey Bee Treatment (Bee Venom)     GI upset, nausea, throat itching     Review of Systems Constitutional: -fever, -chills, -sweats, -unexpected weight change, -decreased appetite, -fatigue Allergy: -sneezing, -itching, -congestion Dermatology: -changing moles, --rash, -lumps ENT: -runny nose, -ear pain, -sore throat, -hoarseness, -sinus pain, -teeth pain, - ringing in ears, -hearing loss, -nosebleeds Cardiology: -chest pain, -palpitations, -swelling, -difficulty breathing when lying flat, -waking up short of breath Respiratory: -cough, -shortness of breath, -difficulty breathing with exercise or exertion, -wheezing, -coughing up blood Gastroenterology: -abdominal pain, -nausea, -vomiting, -diarrhea, -constipation, -blood in stool, -changes in bowel movement, -difficulty swallowing or eating Hematology: -bleeding, -bruising  Musculoskeletal: -joint aches, -muscle aches, -joint swelling, -back pain, -neck pain, -cramping, -changes in gait Ophthalmology: denies vision changes, eye redness, itching, discharge Urology: -burning with urination, -difficulty urinating, -blood in urine, -urinary frequency, -urgency, -incontinence Neurology: -headache, -  weakness, -tingling, -numbness, -memory loss, -falls, -dizziness Psychology: -depressed mood, -agitation, -sleep problems     Objective:   Physical Exam  Filed Vitals:   06/09/12 0821  BP: 118/80  Pulse: 60  Temp: 98 F (36.7 C)  Resp: 16    General appearance: alert, no distress, WD/WN, large build white male Skin:scattered benign appeairng macules, no worrisome lesions, right inner  upper thigh with brown uniform lesion, approx 2cm x 4cm, birth mark per pt. left great toenail with yellowish coloration, cut down to 3/4 of normal length, thickened crusting nail, rest of nails appear normal HEENT: normocephalic, conjunctiva/corneas normal, sclerae anicteric, PERRLA, EOMi, nares patent, no discharge or erythema, pharynx normal Oral cavity: MMM, tongue normal, teeth in good repair Neck: supple, no lymphadenopathy, no thyromegaly, no masses, normal ROM, no bruits Chest: non tender, normal shape and expansion Heart: RRR, normal S1, S2, no murmurs Lungs: CTA bilaterally, no wheezes, rhonchi, or rales Abdomen: +bs, soft, obese abdomen, non tender, non distended, no masses, no hepatomegaly, no splenomegaly, no bruits Back: non tender, normal ROM, no scoliosis Musculoskeletal: upper extremities non tender, no obvious deformity, normal ROM throughout, lower extremities non tender, no obvious deformity, normal ROM throughout Extremities: no edema, no cyanosis, no clubbing Pulses: 2+ symmetric, upper and lower extremities, normal cap refill Neurological: alert, oriented x 3, CN2-12 intact, strength normal upper extremities and lower extremities, sensation normal throughout, DTRs 2+ throughout, no cerebellar signs, gait normal Psychiatric: normal affect, behavior normal, pleasant  GU: normal male external genitalia, nontender, no masses, no hernia, no lymphadenopathy Rectal: anus normal, normal tone, no lesions   Adult ECG Report  Indication: physical, hx/o abnormal EKG  Rate: 58bpm  Rhythm: sinus bradycardia  QRS Axis: 46 degrees  PR Interval:  QRS Duration: 98ms  QTc:  Conduction Disturbances: sinus arrhythmia, no obvious block  Other Abnormalities: none  Patient's cardiac risk factors are: male gender and obesity (BMI >= 30 kg/m2).  EKG comparison: none  Narrative Interpretation: sinus bradycardia with sinus arrhythmia   Assessment and Plan :      Encounter  Diagnoses  Name Primary?  . Routine general medical examination at a health care facility Yes  . Screen for colon cancer   . Obesity   . Onychomycosis   . Abnormal EKG     Physical exam - discussed healthy lifestyle, diet, exercise, preventative care, vaccinations, and addressed their concerns.    Screen for colon cancer - referral for early to mid next year.  He turns 52yo in February 2014   Obesity - c/t efforts at exercise and healthy diet.   He wants to get down in the 220lb region.  Onychomycosis - start Lamisil oral.    We will again request prior 2007 cardiac records.  He will get me copy of the lipid panel he just had done through work.   F/u pending labs.

## 2012-06-10 LAB — HEPATIC FUNCTION PANEL
ALT: 17 U/L (ref 0–53)
Alkaline Phosphatase: 57 U/L (ref 39–117)
Bilirubin, Direct: 0.2 mg/dL (ref 0.0–0.3)
Indirect Bilirubin: 0.9 mg/dL (ref 0.0–0.9)
Total Bilirubin: 1.1 mg/dL (ref 0.3–1.2)

## 2012-06-17 ENCOUNTER — Encounter: Payer: Self-pay | Admitting: Medical

## 2012-08-06 HISTORY — PX: KNEE ARTHROSCOPY: SUR90

## 2012-08-06 HISTORY — PX: LUMBAR DISC SURGERY: SHX700

## 2012-08-06 HISTORY — PX: BACK SURGERY: SHX140

## 2012-08-12 ENCOUNTER — Encounter: Payer: Self-pay | Admitting: Internal Medicine

## 2012-08-12 ENCOUNTER — Telehealth: Payer: Self-pay | Admitting: Family Medicine

## 2012-08-12 NOTE — Telephone Encounter (Signed)
Patient is aware of his appointment on 10/06/12 @ 800 am for the colonscopy and his nurse visit on 09/26/12 @ 330 pm at [facility] with Dr. Rhea Belton. CLS

## 2012-09-26 ENCOUNTER — Ambulatory Visit (AMBULATORY_SURGERY_CENTER): Payer: BC Managed Care – PPO

## 2012-09-26 VITALS — Ht 72.0 in | Wt 300.0 lb

## 2012-09-26 DIAGNOSIS — Z1211 Encounter for screening for malignant neoplasm of colon: Secondary | ICD-10-CM

## 2012-09-26 MED ORDER — MOVIPREP 100 G PO SOLR: 1.0000 | Freq: Once | ORAL | Status: DC

## 2012-09-29 ENCOUNTER — Encounter: Payer: Self-pay | Admitting: Internal Medicine

## 2012-10-04 HISTORY — PX: COLONOSCOPY: SHX174

## 2012-10-06 ENCOUNTER — Ambulatory Visit (AMBULATORY_SURGERY_CENTER): Payer: BC Managed Care – PPO | Admitting: Internal Medicine

## 2012-10-06 ENCOUNTER — Encounter: Payer: Self-pay | Admitting: Internal Medicine

## 2012-10-06 VITALS — BP 137/90 | HR 53 | Temp 97.1°F | Resp 20 | Ht 72.0 in | Wt 300.0 lb

## 2012-10-06 DIAGNOSIS — Z1211 Encounter for screening for malignant neoplasm of colon: Secondary | ICD-10-CM

## 2012-10-06 MED ORDER — SODIUM CHLORIDE 0.9 % IV SOLN: 500.0000 mL | INTRAVENOUS | Status: DC

## 2012-10-06 NOTE — Op Note (Signed)
[facility] [address] [place], [address]   COLONOSCOPY PROCEDURE REPORT  PATIENT: Nicholas Horn, Nicholas Horn  MR#: [id number] BIRTHDATE: 07/05/1961 , 52  yrs. old GENDER: Male ENDOSCOPIST: Beverley Fiedler, MD REFERRED HQ:[id number], Shane PROCEDURE DATE:  10/06/2012 PROCEDURE:   Colonoscopy, screening ASA CLASS:   Class I INDICATIONS:average risk screening and first colonoscopy. MEDICATIONS: MAC sedation, administered by CRNA and propofol (Diprivan) 300mg  IV  DESCRIPTION OF PROCEDURE:   After the risks benefits and alternatives of the procedure were thoroughly explained, informed consent was obtained.  A digital rectal exam revealed no rectal mass.   The LB CF-[id number]  endoscope was introduced through the anus and advanced to the cecum, which was identified by both the appendix and ileocecal valve. No adverse events experienced. The quality of the prep was Moviprep fair requiring copious irrigation and lavage. The instrument was then slowly withdrawn as the colon was fully examined.      COLON FINDINGS: Fair preparation, specifically in the right colon requiring copious irrigation and lavage yielding adequate views. Mild diverticulosis was noted in the sigmoid colon.   The colon mucosa was otherwise normal.  Retroflexed views revealed no abnormalities. The time to cecum=4 minutes 58 seconds.  Withdrawal time=12 minutes 25 seconds.  The scope was withdrawn and the procedure completed. COMPLICATIONS: There were no complications.  ENDOSCOPIC IMPRESSION: 1.   Mild diverticulosis was noted in the sigmoid colon 2.   The colon mucosa was otherwise normal  RECOMMENDATIONS: 1.  High fiber diet 2.  Repeat Colonoscopy in 5 years, shorter than usual recommended interval due to fair preparation.   eSigned:  Beverley Fiedler, MD 10/06/2012 10:21 AM   cc: The Patient

## 2012-10-06 NOTE — Progress Notes (Signed)
Patient did not experience any of the following events: a burn prior to discharge; a fall within the facility; wrong site/side/patient/procedure/implant event; or a hospital transfer or hospital admission upon discharge from the facility. (G8907) Patient did not have preoperative order for IV antibiotic SSI prophylaxis. ([id number])  

## 2012-10-06 NOTE — Patient Instructions (Addendum)

## 2012-10-07 ENCOUNTER — Telehealth: Payer: Self-pay

## 2012-10-07 NOTE — Telephone Encounter (Signed)
  Follow up Call-  Call back number 10/06/2012  Post procedure Call Back phone  # [phone]  Permission to leave phone message Yes     Patient questions:  Do you have a fever, pain , or abdominal swelling? yes Pain Score  3 *Has headache.  Hasn't taken any OTC meds for yet.   Have you tolerated food without any problems? yes  Have you been able to return to your normal activities? yes  Do you have any questions about your discharge instructions: Diet   no Medications  no Follow up visit  no  Do you have questions or concerns about your Care? no  Actions: * If pain score is 4 or above: No action needed, pain <4.

## 2012-10-24 ENCOUNTER — Other Ambulatory Visit: Payer: Self-pay | Admitting: Medical

## 2012-10-24 ENCOUNTER — Telehealth: Payer: Self-pay | Admitting: Medical

## 2012-10-24 MED ORDER — MECLIZINE HCL 25 MG PO TABS: 25.0000 mg | ORAL_TABLET | Freq: Three times a day (TID) | ORAL | Status: DC | PRN

## 2012-10-24 NOTE — Telephone Encounter (Signed)
Med sent.  If this isn't resolving in 1-2 wk, then recheck.  May need different treatment if not improving.

## 2012-10-24 NOTE — Telephone Encounter (Signed)
Patient is aware of the medication sent and to follow up if not better. CLS

## 2013-01-09 ENCOUNTER — Encounter (HOSPITAL_COMMUNITY): Payer: Self-pay | Admitting: *Deleted

## 2013-01-09 ENCOUNTER — Emergency Department (HOSPITAL_COMMUNITY)
Admission: EM | Admit: 2013-01-09 | Discharge: 2013-01-09 | Disposition: A | Discharge status: Home / Self Care | Payer: BC Managed Care – PPO | Attending: Emergency Medicine | Admitting: Emergency Medicine

## 2013-01-09 ENCOUNTER — Emergency Department (HOSPITAL_COMMUNITY): Payer: BC Managed Care – PPO

## 2013-01-09 DIAGNOSIS — R209 Unspecified disturbances of skin sensation: Secondary | ICD-10-CM | POA: Insufficient documentation

## 2013-01-09 DIAGNOSIS — Z87891 Personal history of nicotine dependence: Secondary | ICD-10-CM | POA: Insufficient documentation

## 2013-01-09 DIAGNOSIS — H547 Unspecified visual loss: Secondary | ICD-10-CM | POA: Insufficient documentation

## 2013-01-09 DIAGNOSIS — M5430 Sciatica, unspecified side: Secondary | ICD-10-CM | POA: Insufficient documentation

## 2013-01-09 DIAGNOSIS — M5432 Sciatica, left side: Secondary | ICD-10-CM

## 2013-01-09 MED ORDER — PREDNISONE 20 MG PO TABS
60.0000 mg | ORAL_TABLET | Freq: Once | ORAL | Status: AC
  Administered 2013-01-09: 60 mg via ORAL
  Filled 2013-01-09: qty 3

## 2013-01-09 MED ORDER — OXYCODONE-ACETAMINOPHEN 5-325 MG PO TABS
2.0000 | ORAL_TABLET | Freq: Once | ORAL | Status: AC
  Administered 2013-01-09: 2 via ORAL
  Filled 2013-01-09: qty 2

## 2013-01-09 MED ORDER — PREDNISONE 10 MG PO TABS: 20.0000 mg | ORAL_TABLET | Freq: Every day | ORAL | Status: DC

## 2013-01-09 MED ORDER — OXYCODONE-ACETAMINOPHEN 5-325 MG PO TABS: 1.0000 | ORAL_TABLET | Freq: Four times a day (QID) | ORAL | Status: DC | PRN

## 2013-01-09 MED ORDER — MORPHINE SULFATE 4 MG/ML IJ SOLN
4.0000 mg | Freq: Once | INTRAMUSCULAR | Status: AC
  Administered 2013-01-09: 4 mg via INTRAMUSCULAR
  Filled 2013-01-09: qty 1

## 2013-01-09 MED ORDER — METHOCARBAMOL 500 MG PO TABS: 500.0000 mg | ORAL_TABLET | Freq: Two times a day (BID) | ORAL | Status: DC

## 2013-01-09 MED ORDER — DIAZEPAM 2 MG PO TABS
2.0000 mg | ORAL_TABLET | Freq: Once | ORAL | Status: AC
  Administered 2013-01-09: 2 mg via ORAL
  Filled 2013-01-09: qty 1

## 2013-01-09 NOTE — ED Notes (Signed)
The pt injured his lower back at work Monday.  C/o pain.  He has chronic back pain

## 2013-01-09 NOTE — ED Provider Notes (Signed)
History  This chart was scribed for Nicholas Pel, MS, PA-C working with Derwood Kaplan, MD by Ardelia Mems, ED Scribe. This patient was seen in room TR10C/TR10C and the patient's care was started at 9:34 PM.   CSN: [id number]  Arrival date & time 01/09/13  2103     Chief Complaint  Patient presents with  . Back Pain     The history is provided by the patient. No language interpreter was used.    HPI Comments: Nicholas Horn is a 52 y.o. male who presents to the Emergency Department complaining of constant, moderate lower back pain onset 4 days ago. Pt states that he has intermittent, chronic back pain and that his current pain is from an injury to his back that occurred 4 days ago at work. Pt states that 4 days ago he picked up a control panel at work, and injured his back. Pt states that at this time he had back spasms and an onset of numbness from his left knee down to his left foot. Pt states that he has a degenerated disc, and that his muscles spasm occasionally, but that the numbness in a new symptom. Pt states that his pain is worst in the morning. He also states that he has taken Ibuprofen for the past 4 days with only mild relief. Pt denies bowel incontinence, urinary symptoms, neck pain, abdominal pain, fever, vomiting, diarrhea or any other symptoms.  Past Medical History  Diagnosis Date  . Vision impairment     wears reading glasses  . EKG abnormality 2007    stress test and cardiac evaluation - [facility]    Past Surgical History  Procedure Laterality Date  . Cardiac catheterization  2007    due to equivocal stress test.  normal cath per pt report  . Wisdom tooth extraction      age 32yo    Family History  Problem Relation Age of Onset  . Heart disease Mother 2    died of heart disease, age 62yo  . Obesity Mother   . Hypertension Mother   . Hyperlipidemia Mother   . Cancer Father     died of stomach and intestinal cancer  . Heart disease Brother      CAD, stent  . Other Brother     substance abuse  . Diabetes Neg Hx   . Stroke Neg Hx   . Colon cancer Neg Hx     History  Substance Use Topics  . Smoking status: Former Smoker -- 1.00 packs/day for 12 years    Types: Cigarettes    Start date: 09/06/1985  . Smokeless tobacco: Not on file  . Alcohol Use: No      Review of Systems  Constitutional: Negative for fever and chills.  HENT: Negative for neck pain.   Gastrointestinal: Negative for nausea, vomiting, abdominal pain and diarrhea.  Musculoskeletal: Positive for back pain.  Neurological: Positive for numbness.   A complete 10 system review of systems was obtained and all systems are negative except as noted in the HPI and PMH.   Allergies  Honey bee treatment  Home Medications   Current Outpatient Rx  Name  Route  Sig  Dispense  Refill  . methocarbamol (ROBAXIN) 500 MG tablet   Oral   Take 1 tablet (500 mg total) by mouth 2 (two) times daily.   20 tablet   0   . oxyCODONE-acetaminophen (PERCOCET/ROXICET) 5-325 MG per tablet   Oral   Take 1 tablet by  mouth every 6 (six) hours as needed for pain.   15 tablet   0   . predniSONE (DELTASONE) 10 MG tablet   Oral   Take 2 tablets (20 mg total) by mouth daily.   21 tablet   0     Prednisone dose pack directions:   6 tabs on day ...     Triage Vitals: BP 142/100  Pulse 85  Temp(Src) 97.6 F (36.4 C) (Oral)  Resp 16  SpO2 100%  Physical Exam  Nursing note and vitals reviewed. Constitutional: He is oriented to person, place, and time. He appears well-developed and well-nourished.  HENT:  Head: Normocephalic.  Eyes: EOM are normal.  Neck: Normal range of motion.  Pulmonary/Chest: Effort normal.  Abdominal: He exhibits no distension.  Musculoskeletal: Normal range of motion.       Lumbar back: He exhibits tenderness, pain and spasm. He exhibits normal range of motion.       Back:  Neurological: He is alert and oriented to person, place, and time.   Psychiatric: He has a normal mood and affect.    ED Course  Procedures (including critical care time)  DIAGNOSTIC STUDIES: Oxygen Saturation is 100% on RA, normal by my interpretation.    COORDINATION OF CARE: 11:05 PM- Pt advised of plan for treatment and pt agrees.  PT WAS ADVISED NOT TO WORK WHILE ON MEDICATIONS.  Medications  morphine 4 MG/ML injection 4 mg (4 mg Intramuscular Given 01/09/13 2310)  oxyCODONE-acetaminophen (PERCOCET/ROXICET) 5-325 MG per tablet 2 tablet (2 tablets Oral Given 01/09/13 2317)  predniSONE (DELTASONE) tablet 60 mg (60 mg Oral Given 01/09/13 2316)  diazepam (VALIUM) tablet 2 mg (2 mg Oral Given 01/09/13 2317)   Referred to Ortho  Labs Reviewed - No data to display Dg Lumbar Spine Complete  01/09/2013   *RADIOLOGY REPORT*  Clinical Data: Low back pain following injury.  LUMBAR SPINE - COMPLETE 4+ VIEW  Comparison: None  Findings: Five non-rib bearing lumbar type vertebra are identified. There is no evidence of fracture or subluxation. Multilevel degenerative disc disease noted, moderate at L4-L5 and L5-S1. No focal bony lesions or spondylolysis noted.  IMPRESSION: No evidence of acute abnormality.  Multilevel degenerative disc disease, moderate from L4-S1.   Original Report Authenticated By: Harmon Pier, M.D.     1. Sciatica neuralgia, left       MDM   52 y.o.Nicholas Horn's evaluation in the Emergency Department is complete. It has been determined that no acute conditions requiring further emergency intervention are present at this time. The patient/guardian have been advised of the diagnosis and plan. We have discussed signs and symptoms that warrant return to the ED, such as changes or worsening in symptoms.  Vital signs are stable at discharge. Filed Vitals:   01/09/13 2219  BP:   Pulse: 61  Temp:   Resp: 18    Patient/guardian has voiced understanding and agreed to follow-up with the PCP or specialist.   I personally performed the services  described in this documentation, which was scribed in my presence. The recorded information has been reviewed and is accurate.     Dorthula Matas, PA-C 01/09/13 2319

## 2013-01-14 NOTE — ED Provider Notes (Signed)
Medical screening examination/treatment/procedure(s) were performed by non-physician practitioner and as supervising physician I was immediately available for consultation/collaboration.  Derwood Kaplan, MD 01/14/13 1610

## 2013-01-23 ENCOUNTER — Encounter: Payer: Self-pay | Admitting: Internal Medicine

## 2013-01-23 ENCOUNTER — Ambulatory Visit (INDEPENDENT_AMBULATORY_CARE_PROVIDER_SITE_OTHER): Payer: BC Managed Care – PPO | Admitting: Internal Medicine

## 2013-01-23 ENCOUNTER — Telehealth: Payer: Self-pay | Admitting: Internal Medicine

## 2013-01-23 VITALS — BP 146/84 | HR 76 | Temp 97.6°F | Ht 76.0 in | Wt 309.0 lb

## 2013-01-23 DIAGNOSIS — M5416 Radiculopathy, lumbar region: Secondary | ICD-10-CM | POA: Insufficient documentation

## 2013-01-23 DIAGNOSIS — IMO0002 Reserved for concepts with insufficient information to code with codable children: Secondary | ICD-10-CM

## 2013-01-23 DIAGNOSIS — M79673 Pain in unspecified foot: Secondary | ICD-10-CM | POA: Insufficient documentation

## 2013-01-23 DIAGNOSIS — M79671 Pain in right foot: Secondary | ICD-10-CM

## 2013-01-23 DIAGNOSIS — M79609 Pain in unspecified limb: Secondary | ICD-10-CM

## 2013-01-23 DIAGNOSIS — E785 Hyperlipidemia, unspecified: Secondary | ICD-10-CM

## 2013-01-23 NOTE — Progress Notes (Signed)
Subjective:    Patient ID: Nicholas Horn, male    DOB: Feb 12, 1961, 52 y.o.   MRN: [id number]  HPI  52 year old white male to reestablish. He was last seen 2010. He has past medical history of obesity and obstructive sleep apnea. Patient has multiple complaints today. His main issue is low back pain that started several weeks ago. He has history of degenerative disc disease. He has had periodic exacerbations in the past. However most recently he describes a stabbing sensation in his left thigh. He was seen at urgent care and prescribed Percocet, muscle relaxer and given intramuscular steroids. Patient reports pain has significantly improved but not completely resolved. He has intermittent numbness of his left foot and left lower leg.  He also complains of intermittent right heel pain. He has noticed significant tender nodule near his Achilles tendon. His symptoms worsen activity.  Interval medical history-patient reports experiencing intermittent vertigo in November of 2013. Symptoms started after he gave up on her blood. However his symptoms are triggered by head movements. He was diagnosed with possible positional vertigo. His symptoms last 3-4 months and then resolved on its own. He has not had any recurrent symptoms.  Review of Systems  Constitutional: Negative for activity change, appetite change and unexpected weight change. Patient reports losing significant amount of weight 6 months ago. However he has regained most of weight recently due to inactivity from low back pain. Eyes: Negative for visual disturbance.  Respiratory: Negative for cough, chest tightness and shortness of breath.   Cardiovascular: Negative for chest pain.  Genitourinary: Negative for difficulty urinating.  Neurological: Negative for headaches.  Gastrointestinal: Negative for abdominal pain, heartburn melena or hematochezia Psych: Negative for depression or anxiety Endo:  No polyuria or polydypsia       Past  Medical History  Diagnosis Date  . Vision impairment     wears reading glasses  . EKG abnormality 2007    stress test and cardiac evaluation - [facility]    History   Social History  . Marital Status: Divorced    Spouse Name: N/A    Number of Children: N/A  . Years of Education: N/A   Occupational History  . maintensance supervisor    Social History Main Topics  . Smoking status: Former Smoker -- 1.00 packs/day for 12 years    Types: Cigarettes    Start date: 09/06/1985  . Smokeless tobacco: Not on file  . Alcohol Use: No  . Drug Use: No  . Sexually Active: Not on file   Other Topics Concern  . 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 Cardiac catheterization  2007    due to equivocal stress test.  normal cath per pt report  . Wisdom tooth extraction      age 52yo    Family History  Problem Relation Age of Onset  . Heart disease Mother 33    died of heart disease, age 83yo  . Obesity Mother   . Hypertension Mother   . Hyperlipidemia Mother   . Cancer Father     died of stomach and intestinal cancer  . Heart disease Brother     CAD, stent  .  Other Brother     substance abuse  . Diabetes Neg Hx   . Stroke Neg Hx   . Colon cancer Neg Hx     Allergies  Allergen Reactions  . Honey Bee Treatment (Bee Venom)     GI upset, nausea, throat itching    No current outpatient prescriptions on file prior to visit.   No current facility-administered medications on file prior to visit.    BP 146/84  Pulse 76  Temp(Src) 97.6 F (36.4 C) (Oral)  Ht 6\' 4"  (1.93 m)  Wt 309 lb (140.161 kg)  BMI 37.63 kg/m2    Objective:   Physical Exam  Constitutional: He is  oriented to person, place, and time. He appears well-developed and well-nourished. No distress.  HENT:  Head: Normocephalic.  Right Ear: External ear normal.  Left Ear: External ear normal.  Mouth/Throat: Oropharynx is clear and moist.  Eyes: Conjunctivae and EOM are normal. Pupils are equal, round, and reactive to light.  Neck: Neck supple.  No carotid bruit  Cardiovascular: Normal rate, regular rhythm and normal heart sounds.   No murmur heard. Pulmonary/Chest: Effort normal and breath sounds normal. He has no wheezes.  Abdominal: Soft. Bowel sounds are normal. He exhibits no mass. There is no tenderness.  Musculoskeletal: He exhibits no edema.  Lymphadenopathy:    He has no cervical adenopathy.  Neurological: He is alert and oriented to person, place, and time. No cranial nerve deficit. He exhibits normal muscle tone. Coordination normal.  Negative straight leg test. Decreased patellar reflex left side  Skin: Skin is warm and dry.  Possible acanthosis nigricans of axilla  Psychiatric: He has a normal mood and affect. His behavior is normal.          Assessment & Plan:

## 2013-01-23 NOTE — Telephone Encounter (Signed)
Future orders placed, pt aware 

## 2013-01-23 NOTE — Assessment & Plan Note (Signed)
52 year old with history of lumbar degenerative disc disease had recent exacerbation associated with left leg numbness and severe stabbing pain in his back. He likely has lumbar radiculopathy. He has diminished patellar reflex.  Obtain MRI of lumbar spine.

## 2013-01-23 NOTE — Assessment & Plan Note (Signed)
Patient complains of right heel pain. He has nodularity of his Achilles tendon. Refer to orthopedic specialist for further evaluation. Patient may be at higher risk for Achilles tendon rupture.

## 2013-01-23 NOTE — Assessment & Plan Note (Signed)
Healthy diet encouraged. His ability to exercise limited by low back pain. Screen for type 2 diabetes.

## 2013-01-23 NOTE — Telephone Encounter (Signed)
Pt would like to go to the [facility] for his lab work Dr Artist Pais ordered. Is that OK and can you put in the computer?

## 2013-01-23 NOTE — Patient Instructions (Addendum)
Please complete the following lab tests before your next follow up appointment: BMET, CBCD, FLP, LFTs, TSH - 272.4 A1c -  278.01

## 2013-01-27 ENCOUNTER — Other Ambulatory Visit (INDEPENDENT_AMBULATORY_CARE_PROVIDER_SITE_OTHER): Payer: BC Managed Care – PPO

## 2013-01-27 DIAGNOSIS — E785 Hyperlipidemia, unspecified: Secondary | ICD-10-CM

## 2013-01-27 LAB — BASIC METABOLIC PANEL
BUN: 12 mg/dL (ref 6–23)
Chloride: 106 mEq/L (ref 96–112)
Glucose, Bld: 94 mg/dL (ref 70–99)
Potassium: 4.2 mEq/L (ref 3.5–5.1)

## 2013-01-27 LAB — CBC WITH DIFFERENTIAL/PLATELET
Basophils Absolute: 0 10*3/uL (ref 0.0–0.1)
Eosinophils Relative: 6.4 % — ABNORMAL HIGH (ref 0.0–5.0)
HCT: 40.4 % (ref 39.0–52.0)
Hemoglobin: 14.1 g/dL (ref 13.0–17.0)
Lymphs Abs: 1.7 10*3/uL (ref 0.7–4.0)
MCV: 93.4 fl (ref 78.0–100.0)
Monocytes Absolute: 0.4 10*3/uL (ref 0.1–1.0)
Neutro Abs: 3 10*3/uL (ref 1.4–7.7)
Platelets: 218 10*3/uL (ref 150.0–400.0)
RDW: 12.9 % (ref 11.5–14.6)

## 2013-01-27 LAB — HEPATIC FUNCTION PANEL
AST: 19 U/L (ref 0–37)
Albumin: 4.1 g/dL (ref 3.5–5.2)
Alkaline Phosphatase: 59 U/L (ref 39–117)
Bilirubin, Direct: 0.1 mg/dL (ref 0.0–0.3)

## 2013-01-27 LAB — LIPID PANEL
Cholesterol: 210 mg/dL — ABNORMAL HIGH (ref 0–200)
Total CHOL/HDL Ratio: 6

## 2013-01-27 LAB — LDL CHOLESTEROL, DIRECT: Direct LDL: 141.4 mg/dL

## 2013-02-01 ENCOUNTER — Ambulatory Visit
Admission: RE | Admit: 2013-02-01 | Discharge: 2013-02-01 | Disposition: A | Discharge status: Home / Self Care | Payer: BC Managed Care – PPO | Source: Ambulatory Visit | Attending: Internal Medicine | Admitting: Internal Medicine

## 2013-02-01 DIAGNOSIS — M5416 Radiculopathy, lumbar region: Secondary | ICD-10-CM

## 2013-02-04 ENCOUNTER — Other Ambulatory Visit: Payer: Self-pay | Admitting: Internal Medicine

## 2013-02-04 DIAGNOSIS — M5126 Other intervertebral disc displacement, lumbar region: Secondary | ICD-10-CM

## 2013-02-07 ENCOUNTER — Telehealth: Payer: Self-pay | Admitting: Internal Medicine

## 2013-02-07 NOTE — Telephone Encounter (Signed)
Patient called in today, says that Dr. Artist Pais referred him to see a specialist for his back, however his back went out this morning. Wants to know can his anti inflammatory and pain medications be refilled, he would like for them to be called into his pharmacy. I asked could he come in today, he says that he can hardly move. Please advise.

## 2013-02-09 ENCOUNTER — Ambulatory Visit: Payer: BC Managed Care – PPO | Admitting: Family

## 2013-02-09 ENCOUNTER — Encounter: Payer: Self-pay | Admitting: Family Medicine

## 2013-02-09 ENCOUNTER — Ambulatory Visit (INDEPENDENT_AMBULATORY_CARE_PROVIDER_SITE_OTHER): Payer: BC Managed Care – PPO | Admitting: Family Medicine

## 2013-02-09 VITALS — BP 122/84 | Temp 98.0°F | Wt 302.0 lb

## 2013-02-09 DIAGNOSIS — M549 Dorsalgia, unspecified: Secondary | ICD-10-CM

## 2013-02-09 DIAGNOSIS — IMO0002 Reserved for concepts with insufficient information to code with codable children: Secondary | ICD-10-CM

## 2013-02-09 MED ORDER — CYCLOBENZAPRINE HCL 10 MG PO TABS: 10.0000 mg | ORAL_TABLET | Freq: Three times a day (TID) | ORAL | Status: DC | PRN

## 2013-02-09 MED ORDER — OXYCODONE-ACETAMINOPHEN 5-325 MG PO TABS: 1.0000 | ORAL_TABLET | Freq: Three times a day (TID) | ORAL | Status: DC | PRN

## 2013-02-09 NOTE — Patient Instructions (Addendum)
-  heat for 15 minutes twice daily  -gentle activity  -ibuprofen or tylenol per instructions, percocet for severe pain - do not take more then 3000mg  acetominophen or tylenol daily and use percocet as sparingly as possible  -follow up with you doctor and the nuerosugeon

## 2013-02-09 NOTE — Progress Notes (Signed)
Chief Complaint  Patient presents with  . Back Pain    HPI:  Acute visit for lumbar back pain with radicular symptoms: -recently evaluated by PCP with MRI with DD and scheduled to see [facility] in a few days -called in interim for pain management/refill on medications but reports call not returned so came in to office -pain is in lumbar back and radiating to L thigh with stabbing sensation, pain severe at times -denies: fevers, chills, weakness, numbness, bowel or bladder incontinence -was taking percocet and muscle relaxer but has been out of these for some time and wanted refill until sees [facility]U ROS: See pertinent positives and negatives per HPI.  Past Medical History  Diagnosis Date  . Vision impairment     wears reading glasses  . EKG abnormality 2007    stress test and cardiac evaluation - [facility]    Family History  Problem Relation Age of Onset  . Heart disease Mother 28    died of heart disease, age 31yo  . Obesity Mother   . Hypertension Mother   . Hyperlipidemia Mother   . Cancer Father     died of stomach and intestinal cancer  . Heart disease Brother     CAD, stent  . Other Brother     substance abuse  . Diabetes Neg Hx   . Stroke Neg Hx   . Colon cancer Neg Hx     History   Social History  . Marital Status: Divorced    Spouse Name: N/A    Number of Children: N/A  . Years of Education: N/A   Occupational History  . maintensance supervisor    Social History Main Topics  . Smoking status: Former Smoker -- 1.00 packs/day for 12 years    Types: Cigarettes    Start date: 09/06/1985  . Smokeless tobacco: None  . Alcohol Use: No  . Drug Use: No  . Sexually Active: None   Other Topics Concern  . 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  GENERAL: vitals reviewed and listed above, alert, oriented, appears well hydrated and in no acute distress  HEENT: atraumatic, conjunttiva clear, no obvious abnormalities on inspection of external nose and ears  NECK: no obvious masses on inspection  LUNGS: clear to auscultation bilaterally, no wheezes, rales or rhonchi, good air movement  CV: HRRR, no peripheral edema  MS: moves all extremities without noticeable abnormality Normal Gait Normal inspection of back, no obvious scoliosis or leg length descrepancy No bony TTP Soft tissue TTP at: -/+ tests: neg trendelenburg,+facet loading L, -SLRT, -CLRT, -FABER, -FADIR Normal muscle strength, sensation to light touch, decreased L patellar tendon reflex   PSYCH: pleasant and cooperative, no obvious depression or anxiety  ASSESSMENT AND PLAN:  Discussed the following assessment and plan:  Back pain - Plan: cyclobenzaprine (FLEXERIL) 10 MG tablet, oxyCODONE-acetaminophen (PERCOCET) 5-325 MG per tablet, DISCONTINUED: oxyCODONE-acetaminophen (ROXICET) 5-325 MG per tablet  DDD (degenerative disc disease) - Plan: cyclobenzaprine (FLEXERIL) 10 MG tablet, DISCONTINUED: oxyCODONE-acetaminophen (ROXICET) 5-325 MG per tablet  -refilled meds, discussed risks and use -has appt with [facility]U in a few days, return precuations -Patient advised to return or notify a doctor immediately if symptoms worsen or persist or new concerns arise.  Patient Instructions  -heat for 15 minutes  twice daily  -gentle activity  -ibuprofen or tylenol per instructions, percocet for severe pain - do not take more then 3000mg  acetominophen or tylenol daily and use percocet as sparingly as possible  -follow up with you doctor and the nuerosugeon     Mohmmad Saleeby, [employer].

## 2013-02-09 NOTE — Telephone Encounter (Signed)
Looks like he was seen by Dr. Selena Batten

## 2013-02-10 ENCOUNTER — Ambulatory Visit: Payer: BC Managed Care – PPO | Admitting: Family Medicine

## 2013-02-20 ENCOUNTER — Ambulatory Visit: Payer: BC Managed Care – PPO | Admitting: Internal Medicine

## 2013-02-23 ENCOUNTER — Ambulatory Visit (INDEPENDENT_AMBULATORY_CARE_PROVIDER_SITE_OTHER): Payer: BC Managed Care – PPO | Admitting: Family Medicine

## 2013-02-23 DIAGNOSIS — E785 Hyperlipidemia, unspecified: Secondary | ICD-10-CM

## 2013-02-23 DIAGNOSIS — IMO0002 Reserved for concepts with insufficient information to code with codable children: Secondary | ICD-10-CM

## 2013-02-23 DIAGNOSIS — M5416 Radiculopathy, lumbar region: Secondary | ICD-10-CM

## 2013-02-23 NOTE — Progress Notes (Signed)
No chief complaint on file.   HPI:  Follow up recent labs (PCP unavailable):  HLD: -on recent labs -he would prefer to hold off on medication at this time and do lifestyle changes   Back Pain: -had neurosurgery recently -doing much better now, weaning off pain meds  ROS: See pertinent positives and negatives per HPI.  Past Medical History  Diagnosis Date  . Vision impairment     wears reading glasses  . EKG abnormality 2007    stress test and cardiac evaluation - [facility]    Family History  Problem Relation Age of Onset  . Heart disease Mother 35    died of heart disease, age 2yo  . Obesity Mother   . Hypertension Mother   . Hyperlipidemia Mother   . Cancer Father     died of stomach and intestinal cancer  . Heart disease Brother     CAD, stent  . Other Brother     substance abuse  . Diabetes Neg Hx   . Stroke Neg Hx   . Colon cancer Neg Hx     History   Social History  . Marital Status: Divorced    Spouse Name: N/A    Number of Children: N/A  . Years of Education: N/A   Occupational History  . maintensance supervisor    Social History Main Topics  . Smoking status: Former Smoker -- 1.00 packs/day for 12 years    Types: Cigarettes    Start date: 09/06/1985  . Smokeless tobacco: Not on file  . Alcohol Use: No  . Drug Use: No  . Sexually Active: Not on file   Other Topics Concern  . 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  There is no weight on file to calculate BMI.  GENERAL: vitals reviewed and listed above, alert, oriented, appears well hydrated and in no acute distress  HEENT: atraumatic, conjunttiva clear, no obvious abnormalities on inspection of external nose and ears  NECK: no obvious masses on inspection  LUNGS: clear to auscultation bilaterally, no wheezes, rales or rhonchi, good air movement  CV: HRRR, no peripheral edema  MS: moves all extremities without noticeable abnormality  PSYCH: pleasant and cooperative, no obvious depression or anxiety  ASSESSMENT AND PLAN:  Discussed the following assessment and plan:  HYPERLIPIDEMIA  Left lumbar radiculopathy  -reviewed labs, discussed tx options -prefers to hold off on meds and do lifestyle changes and will follow up in 3-4 months to repeat lipids -discussed weaning off pain meds - he is doing well with this -Patient advised to return or notify a doctor immediately if symptoms worsen or persist or new concerns arise.  There are no Patient Instructions on file for this visit.   Kriste Basque R.

## 2013-06-18 ENCOUNTER — Ambulatory Visit: Payer: BC Managed Care – PPO | Admitting: Internal Medicine

## 2013-06-30 ENCOUNTER — Other Ambulatory Visit (HOSPITAL_COMMUNITY): Payer: Self-pay | Admitting: Orthopedic Surgery

## 2013-07-01 ENCOUNTER — Ambulatory Visit (INDEPENDENT_AMBULATORY_CARE_PROVIDER_SITE_OTHER): Payer: BC Managed Care – PPO | Admitting: Internal Medicine

## 2013-07-01 ENCOUNTER — Encounter: Payer: Self-pay | Admitting: Internal Medicine

## 2013-07-01 VITALS — BP 160/98 | HR 64 | Temp 98.0°F | Ht 76.0 in | Wt 307.0 lb

## 2013-07-01 DIAGNOSIS — M25562 Pain in left knee: Secondary | ICD-10-CM | POA: Insufficient documentation

## 2013-07-01 DIAGNOSIS — M25569 Pain in unspecified knee: Secondary | ICD-10-CM

## 2013-07-01 DIAGNOSIS — M549 Dorsalgia, unspecified: Secondary | ICD-10-CM

## 2013-07-01 DIAGNOSIS — R030 Elevated blood-pressure reading, without diagnosis of hypertension: Secondary | ICD-10-CM

## 2013-07-01 DIAGNOSIS — E785 Hyperlipidemia, unspecified: Secondary | ICD-10-CM

## 2013-07-01 DIAGNOSIS — M5416 Radiculopathy, lumbar region: Secondary | ICD-10-CM

## 2013-07-01 DIAGNOSIS — IMO0002 Reserved for concepts with insufficient information to code with codable children: Secondary | ICD-10-CM

## 2013-07-01 MED ORDER — PRAVASTATIN SODIUM 40 MG PO TABS: 40.0000 mg | ORAL_TABLET | Freq: Every day | ORAL | Status: DC

## 2013-07-01 MED ORDER — OXYCODONE-ACETAMINOPHEN 5-325 MG PO TABS: 1.0000 | ORAL_TABLET | Freq: Three times a day (TID) | ORAL | Status: DC | PRN

## 2013-07-01 NOTE — Patient Instructions (Signed)
Please complete the following lab tests before your next follow up appointment: FLP, LFTs - 272.4 Follow 2200 cal diet per day as directed Monitor your blood pressure 3-4 times per week and keep a log to bring with you to your next follow up appointment

## 2013-07-01 NOTE — Assessment & Plan Note (Signed)
Patient followed by Dr. August Saucer. He underwent arthroscopic knee surgery (left knee) on July 30. He is having persistent symptoms. I refilled his Percocet. Patient is to followup with his orthopedic specialist.

## 2013-07-01 NOTE — Assessment & Plan Note (Signed)
Improved with microdiscectomy performed by Dr. Governor Rooks, 2014

## 2013-07-01 NOTE — Progress Notes (Signed)
Pre visit review using our clinic review tool, if applicable. No additional management support is needed unless otherwise documented below in the visit note. 

## 2013-07-01 NOTE — Assessment & Plan Note (Signed)
Patient's 10 year cardiovascular risk is 8.0%. Start pravastatin 40 mg once daily. Arrange followup lipid panel and LFTs.

## 2013-07-01 NOTE — Assessment & Plan Note (Signed)
Patient was screened for type 2 diabetes. His A1c was normal. He has gained weights due to inactivity for multiple surgeries. We discussed weight loss strategies.

## 2013-07-01 NOTE — Progress Notes (Signed)
Subjective:    Patient ID: Nicholas Horn, male    DOB: [birth year], 52 y.o.   MRN: [id number]  HPI  52 year old white male previously seen for low back pain for followup. Since previous visit patient seen by Dr. Dutch Quint and underwent microdiscectomy in 02/17/2013. His back pain is much improved. Patient also had issues with left knee pain and underwent arthroscopic surgery on 03/04/2013 (Dr. August Saucer). Patient reports he is having persistent pain in left knee and also pain in his right Achilles heel. Achilles heel surgery schedule with Dr. Lajoyce Corners on December 10.  Patient reports his blood pressures sporadically elevated. He attributes this to pain response. His previous blood pressure readings have been unremarkable.  Obesity-patient has gained weight since previous visit. Patient unable to exercise.  We reviewed his previous lipid panel.  Review of Systems Negative for chest pain or shortness of breath    Past Medical History  Diagnosis Date  . Vision impairment     wears reading glasses  . EKG abnormality 2007    stress test and cardiac evaluation - [place] [facility]    History   Social History  . Marital Status: Divorced    Spouse Name: N/A    Number of Children: N/A  . Years of Education: N/A   Occupational History  . maintensance supervisor    Social History Main Topics  . Smoking status: Former Smoker -- 1.00 packs/day for 12 years    Types: Cigarettes    Start date: 09/06/1985  . Smokeless tobacco: Not on file  . Alcohol Use: No  . Drug Use: No  . Sexual Activity: Not on file   Other Topics Concern  . 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 Cardiac catheterization  2007   due to equivocal stress test.  normal cath per pt report  . Wisdom tooth extraction      age 52yo    Family History  Problem Relation Age of Onset  . Heart disease Mother 64    died of heart disease, age 60yo  . Obesity Mother   . Hypertension Mother   . Hyperlipidemia Mother   . Cancer Father     died of stomach and intestinal cancer  . Heart disease Brother     CAD, stent  . Other Brother     substance abuse  . Diabetes Neg Hx   . Stroke Neg Hx   . Colon cancer Neg Hx     Allergies  Allergen Reactions  . Honey     GI upset, nausea, throat itching    No current outpatient prescriptions on file prior to visit.   No current facility-administered medications on file prior to visit.    BP 160/98  Pulse 64  Temp(Src) 98 F (36.7 C) (Oral)  Ht 6\' 4"  (1.93 m)  Wt 307 lb (139.254 kg)  BMI 37.38 kg/m2    Objective:   Physical Exam  Constitutional: He appears well-developed and well-nourished.  HENT:  Head: Normocephalic and atraumatic.  Neck: Neck supple.  No carotid bruit  Cardiovascular: Normal rate, regular rhythm and normal heart sounds.   No murmur heard. Pulmonary/Chest: Effort normal and breath sounds  normal. He has no wheezes.  Musculoskeletal:  Trace lower extremity edema bilaterally  Skin: Skin is warm and dry.  Psychiatric: He has a normal mood and affect. His behavior is normal.          Assessment & Plan:

## 2013-07-09 ENCOUNTER — Encounter (HOSPITAL_COMMUNITY): Payer: Self-pay | Admitting: Pharmacy Technician

## 2013-07-10 ENCOUNTER — Encounter (HOSPITAL_COMMUNITY)
Admission: RE | Admit: 2013-07-10 | Discharge: 2013-07-10 | Disposition: A | Discharge status: Home / Self Care | Payer: BC Managed Care – PPO | Source: Ambulatory Visit | Attending: Orthopedic Surgery | Admitting: Orthopedic Surgery

## 2013-07-10 ENCOUNTER — Encounter (HOSPITAL_COMMUNITY): Payer: Self-pay

## 2013-07-10 ENCOUNTER — Ambulatory Visit (HOSPITAL_COMMUNITY)
Admission: RE | Admit: 2013-07-10 | Discharge: 2013-07-10 | Disposition: A | Discharge status: Home / Self Care | Payer: BC Managed Care – PPO | Source: Ambulatory Visit | Attending: Orthopedic Surgery | Admitting: Orthopedic Surgery

## 2013-07-10 DIAGNOSIS — Z01812 Encounter for preprocedural laboratory examination: Secondary | ICD-10-CM | POA: Insufficient documentation

## 2013-07-10 DIAGNOSIS — Z01818 Encounter for other preprocedural examination: Secondary | ICD-10-CM | POA: Insufficient documentation

## 2013-07-10 DIAGNOSIS — Z01810 Encounter for preprocedural cardiovascular examination: Secondary | ICD-10-CM | POA: Insufficient documentation

## 2013-07-10 HISTORY — DX: Shortness of breath: R06.02

## 2013-07-10 HISTORY — DX: Sleep apnea, unspecified: G47.30

## 2013-07-10 LAB — CBC
HCT: 41.6 % (ref 39.0–52.0)
Hemoglobin: 14.8 g/dL (ref 13.0–17.0)
MCHC: 35.6 g/dL (ref 30.0–36.0)
Platelets: 200 10*3/uL (ref 150–400)
RBC: 4.63 MIL/uL (ref 4.22–5.81)
WBC: 8.9 10*3/uL (ref 4.0–10.5)

## 2013-07-10 LAB — COMPREHENSIVE METABOLIC PANEL
ALT: 29 U/L (ref 0–53)
AST: 24 U/L (ref 0–37)
Alkaline Phosphatase: 77 U/L (ref 39–117)
BUN: 13 mg/dL (ref 6–23)
Chloride: 103 mEq/L (ref 96–112)
GFR calc Af Amer: >90 mL/min (ref >90)
Glucose, Bld: 92 mg/dL (ref 70–99)
Potassium: 4.2 mEq/L (ref 3.5–5.1)
Sodium: 139 mEq/L (ref 135–145)
Total Bilirubin: 0.4 mg/dL (ref 0.3–1.2)
Total Protein: 7.6 g/dL (ref 6.0–8.3)

## 2013-07-10 LAB — APTT: aPTT: 33 seconds (ref 24–37)

## 2013-07-10 LAB — PROTIME-INR: Prothrombin Time: 12.5 seconds (ref 11.6–15.2)

## 2013-07-10 NOTE — Progress Notes (Signed)
07/10/13 1534  OBSTRUCTIVE SLEEP APNEA  Have you ever been diagnosed with sleep apnea through a sleep study? Yes  If yes, do you have and use a CPAP or BPAP machine every night? 0  Do you snore loudly (loud enough to be heard through closed doors)?  0  Do you often feel tired, fatigued, or sleepy during the daytime? 0  Has anyone observed you stop breathing during your sleep? 0  Do you have, or are you being treated for high blood pressure? 0  BMI more than 35 kg/m2? 1  Age over 56 years old? 1  Neck circumference greater than 40 cm/18 inches? 1 (18.5)  Gender: 1  Obstructive Sleep Apnea Score 4  Score 4 or greater  Results sent to PCP

## 2013-07-10 NOTE — Pre-Procedure Instructions (Signed)
Nicholas Horn  07/10/2013   Your procedure is scheduled on:  Wednesday, December 10th.  Report to [facility], Main Entrance/Entrance "A" at 8:30AM.  Call this number if you have problems the morning of surgery: [phone]   Remember:   Do not eat food or drink liquids after midnight.   Take these medicines the morning of surgery with A SIP OF WATER: Take Oxycodone- Acetaminophen if needed.   Do not wear jewelry, make-up or nail polish.  Do not wear lotions, powders, or perfumes. You may wear deodorant.   Men may shave face and neck.  Do not bring valuables to the hospital.  [facility] is not responsible                  for any belongings or valuables.               Contacts, dentures or bridgework may not be worn into surgery.  Leave suitcase in the car. After surgery it may be brought to your room.  For patients admitted to the hospital, discharge time is determined by your                treatment team.               Patients discharged the day of surgery will not be allowed to drive home.  Name and phone number of your driver: -   Special Instructions: Shower using CHG 2 nights before surgery and the night before surgery.  If you shower the day of surgery use CHG.  Use special wash - you have one bottle of CHG for all showers.  You should use approximately 1/3 of the bottle for each shower.   Please read over the following fact sheets that you were given: Pain Booklet, Coughing and Deep Breathing and Surgical Site Infection Prevention

## 2013-07-14 MED ORDER — DEXTROSE 5 % IV SOLN
3.0000 g | INTRAVENOUS | Status: AC
  Administered 2013-07-15: 3 g via INTRAVENOUS
  Filled 2013-07-14: qty 3000

## 2013-07-15 ENCOUNTER — Encounter (HOSPITAL_COMMUNITY): Payer: BC Managed Care – PPO | Admitting: Anesthesiology

## 2013-07-15 ENCOUNTER — Encounter (HOSPITAL_COMMUNITY): Payer: Self-pay | Admitting: *Deleted

## 2013-07-15 ENCOUNTER — Encounter (HOSPITAL_COMMUNITY)
Admission: RE | Disposition: A | Discharge status: Home / Self Care | Payer: Self-pay | Source: Ambulatory Visit | Attending: Orthopedic Surgery

## 2013-07-15 ENCOUNTER — Ambulatory Visit (HOSPITAL_COMMUNITY)
Admission: RE | Admit: 2013-07-15 | Discharge: 2013-07-15 | Disposition: A | Discharge status: Home / Self Care | Payer: BC Managed Care – PPO | Source: Ambulatory Visit | Attending: Orthopedic Surgery | Admitting: Orthopedic Surgery

## 2013-07-15 ENCOUNTER — Ambulatory Visit (HOSPITAL_COMMUNITY): Payer: BC Managed Care – PPO | Admitting: Anesthesiology

## 2013-07-15 DIAGNOSIS — G4730 Sleep apnea, unspecified: Secondary | ICD-10-CM | POA: Insufficient documentation

## 2013-07-15 DIAGNOSIS — M7660 Achilles tendinitis, unspecified leg: Secondary | ICD-10-CM | POA: Insufficient documentation

## 2013-07-15 DIAGNOSIS — M9261 Juvenile osteochondrosis of tarsus, right ankle: Secondary | ICD-10-CM

## 2013-07-15 DIAGNOSIS — M898X9 Other specified disorders of bone, unspecified site: Secondary | ICD-10-CM | POA: Insufficient documentation

## 2013-07-15 HISTORY — PX: 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: SHX5627

## 2013-07-15 SURGERY — 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
Anesthesia: General | Site: Foot | Laterality: Right

## 2013-07-15 MED ORDER — OXYCODONE HCL 5 MG PO TABS
5.0000 mg | ORAL_TABLET | Freq: Once | ORAL | Status: AC | PRN
  Administered 2013-07-15: 5 mg via ORAL

## 2013-07-15 MED ORDER — HYDROMORPHONE HCL PF 1 MG/ML IJ SOLN
INTRAMUSCULAR | Status: AC
  Administered 2013-07-15: 0.5 mg via INTRAVENOUS
  Filled 2013-07-15: qty 1

## 2013-07-15 MED ORDER — METOCLOPRAMIDE HCL 5 MG/ML IJ SOLN: 10.0000 mg | Freq: Once | INTRAMUSCULAR | Status: DC | PRN

## 2013-07-15 MED ORDER — LIDOCAINE HCL (CARDIAC) 20 MG/ML IV SOLN
INTRAVENOUS | Status: DC | PRN
  Administered 2013-07-15: 100 mg via INTRAVENOUS

## 2013-07-15 MED ORDER — DEXAMETHASONE SODIUM PHOSPHATE 4 MG/ML IJ SOLN
INTRAMUSCULAR | Status: DC | PRN
  Administered 2013-07-15: 4 mg via INTRAVENOUS

## 2013-07-15 MED ORDER — PROPOFOL 10 MG/ML IV BOLUS
INTRAVENOUS | Status: DC | PRN
  Administered 2013-07-15: 200 mg via INTRAVENOUS
  Administered 2013-07-15: 50 mg via INTRAVENOUS

## 2013-07-15 MED ORDER — CHLORHEXIDINE GLUCONATE 4 % EX LIQD: 60.0000 mL | Freq: Once | CUTANEOUS | Status: DC

## 2013-07-15 MED ORDER — HYDROMORPHONE HCL PF 1 MG/ML IJ SOLN
0.2500 mg | INTRAMUSCULAR | Status: DC | PRN
  Administered 2013-07-15 (×3): 0.5 mg via INTRAVENOUS

## 2013-07-15 MED ORDER — FENTANYL CITRATE 0.05 MG/ML IJ SOLN
INTRAMUSCULAR | Status: DC | PRN
  Administered 2013-07-15: 100 ug via INTRAVENOUS
  Administered 2013-07-15 (×3): 50 ug via INTRAVENOUS

## 2013-07-15 MED ORDER — MIDAZOLAM HCL 5 MG/5ML IJ SOLN
INTRAMUSCULAR | Status: DC | PRN
  Administered 2013-07-15: 2 mg via INTRAVENOUS

## 2013-07-15 MED ORDER — OXYCODONE HCL 5 MG/5ML PO SOLN: 5.0000 mg | Freq: Once | ORAL | Status: AC | PRN

## 2013-07-15 MED ORDER — ARTIFICIAL TEARS OP OINT
TOPICAL_OINTMENT | OPHTHALMIC | Status: DC | PRN
  Administered 2013-07-15: 1 via OPHTHALMIC

## 2013-07-15 MED ORDER — LACTATED RINGERS IV SOLN
INTRAVENOUS | Status: DC
  Administered 2013-07-15: 09:00:00 via INTRAVENOUS

## 2013-07-15 MED ORDER — OXYCODONE-ACETAMINOPHEN 5-325 MG PO TABS: 1.0000 | ORAL_TABLET | ORAL | Status: DC | PRN

## 2013-07-15 MED ORDER — 0.9 % SODIUM CHLORIDE (POUR BTL) OPTIME
TOPICAL | Status: DC | PRN
  Administered 2013-07-15: 1000 mL

## 2013-07-15 MED ORDER — ONDANSETRON HCL 4 MG/2ML IJ SOLN
INTRAMUSCULAR | Status: DC | PRN
  Administered 2013-07-15: 4 mg via INTRAVENOUS

## 2013-07-15 MED ORDER — OXYCODONE HCL 5 MG PO TABS
ORAL_TABLET | ORAL | Status: AC
  Administered 2013-07-15: 5 mg via ORAL
  Filled 2013-07-15: qty 1

## 2013-07-15 SURGICAL SUPPLY — 49 items
BANDAGE GAUZE ELAST BULKY 4 IN (GAUZE/BANDAGES/DRESSINGS) ×2 IMPLANT
BLADE SURG 10 STRL SS (BLADE) IMPLANT
BNDG COHESIVE 6X5 TAN STRL LF (GAUZE/BANDAGES/DRESSINGS) ×2 IMPLANT
BNDG ESMARK 4X9 LF (GAUZE/BANDAGES/DRESSINGS) IMPLANT
BNDG GAUZE STRTCH 6 (GAUZE/BANDAGES/DRESSINGS) IMPLANT
CANISTER SUCTION 2500CC (MISCELLANEOUS) ×2 IMPLANT
CLOTH BEACON ORANGE TIMEOUT ST (SAFETY) IMPLANT
COTTON STERILE ROLL (GAUZE/BANDAGES/DRESSINGS) IMPLANT
COVER LIGHT HANDLE STERIS (MISCELLANEOUS) IMPLANT
COVER SURGICAL LIGHT HANDLE (MISCELLANEOUS) ×2 IMPLANT
CUFF TOURNIQUET SINGLE 34IN LL (TOURNIQUET CUFF) IMPLANT
CUFF TOURNIQUET SINGLE 44IN (TOURNIQUET CUFF) IMPLANT
DRAPE INCISE IOBAN 66X45 STRL (DRAPES) ×2 IMPLANT
DRAPE U-SHAPE 47X51 STRL (DRAPES) ×2 IMPLANT
DRSG ADAPTIC 3X8 NADH LF (GAUZE/BANDAGES/DRESSINGS) ×2 IMPLANT
DRSG PAD ABDOMINAL 8X10 ST (GAUZE/BANDAGES/DRESSINGS) ×2 IMPLANT
DURAPREP 26ML APPLICATOR (WOUND CARE) ×2 IMPLANT
ELECT REM PT RETURN 9FT ADLT (ELECTROSURGICAL) ×2
ELECTRODE REM PT RTRN 9FT ADLT (ELECTROSURGICAL) ×1 IMPLANT
GLOVE BIOGEL PI IND STRL 6.5 (GLOVE) ×1 IMPLANT
GLOVE BIOGEL PI IND STRL 7.5 (GLOVE) ×1 IMPLANT
GLOVE BIOGEL PI IND STRL 9 (GLOVE) ×1 IMPLANT
GLOVE BIOGEL PI INDICATOR 6.5 (GLOVE) ×1
GLOVE BIOGEL PI INDICATOR 7.5 (GLOVE) ×1
GLOVE BIOGEL PI INDICATOR 9 (GLOVE) ×1
GLOVE SURG ORTHO 9.0 STRL STRW (GLOVE) ×2 IMPLANT
GLOVE SURG SS PI 6.5 STRL IVOR (GLOVE) ×4 IMPLANT
GOWN PREVENTION PLUS XLARGE (GOWN DISPOSABLE) ×2 IMPLANT
GOWN SRG XL XLNG 56XLVL 4 (GOWN DISPOSABLE) ×2 IMPLANT
GOWN STRL NON-REIN XL XLG LVL4 (GOWN DISPOSABLE) ×2
KIT ROOM TURNOVER OR (KITS) ×2 IMPLANT
MANIFOLD NEPTUNE II (INSTRUMENTS) IMPLANT
NDL SUT .5 MAYO 1.404X.05X (NEEDLE) IMPLANT
NEEDLE MAYO TAPER (NEEDLE)
NS IRRIG 1000ML POUR BTL (IV SOLUTION) ×2 IMPLANT
PACK ORTHO EXTREMITY (CUSTOM PROCEDURE TRAY) ×2 IMPLANT
PAD ARMBOARD 7.5X6 YLW CONV (MISCELLANEOUS) ×4 IMPLANT
SPONGE GAUZE 4X4 12PLY (GAUZE/BANDAGES/DRESSINGS) ×2 IMPLANT
SPONGE LAP 4X18 X RAY DECT (DISPOSABLE) IMPLANT
SUT ETHILON 2 0 PSLX (SUTURE) ×2 IMPLANT
SUT ETHILON 3 0 FSLX (SUTURE) IMPLANT
SUT FIBERWIRE #2 38 T-5 BLUE (SUTURE)
SUT MNCRL AB 3-0 PS2 18 (SUTURE) IMPLANT
SUTURE FIBERWR #2 38 T-5 BLUE (SUTURE) IMPLANT
TOWEL OR 17X24 6PK STRL BLUE (TOWEL DISPOSABLE) ×2 IMPLANT
TOWEL OR 17X26 10 PK STRL BLUE (TOWEL DISPOSABLE) ×2 IMPLANT
TUBE CONNECTING 12X1/4 (SUCTIONS) ×2 IMPLANT
WATER STERILE IRR 1000ML POUR (IV SOLUTION) IMPLANT
YANKAUER SUCT BULB TIP NO VENT (SUCTIONS) ×2 IMPLANT

## 2013-07-15 NOTE — Transfer of Care (Signed)
Immediate Anesthesia Transfer of Care Note  Patient: Nicholas Horn  Procedure(s) Performed: Procedure(s) with comments: Resection right Haglund Deformity (Right) - Resection right Haglund Deformity  Patient Location: PACU  Anesthesia Type:General  Level of Consciousness: awake, alert , oriented and patient cooperative  Airway & Oxygen Therapy: Patient Spontanous Breathing and Patient connected to nasal cannula oxygen  Post-op Assessment: Report given to PACU RN, Post -op Vital signs reviewed and stable and Patient moving all extremities X 4  Post vital signs: Reviewed and stable  Complications: No apparent anesthesia complications

## 2013-07-15 NOTE — Preoperative (Signed)
Beta Blockers   Reason not to administer Beta Blockers:Not Applicable 

## 2013-07-15 NOTE — Anesthesia Procedure Notes (Signed)
Procedure Name: LMA Insertion Date/Time: 07/15/2013 10:42 AM Performed by: Jefm Miles E Pre-anesthesia Checklist: Patient identified, Emergency Drugs available, Suction available, Patient being monitored and Timeout performed Patient Re-evaluated:Patient Re-evaluated prior to inductionOxygen Delivery Method: Circle system utilized Preoxygenation: Pre-oxygenation with 100% oxygen Intubation Type: IV induction LMA: LMA with gastric port inserted LMA Size: 5.0 Number of attempts: 1 Placement Confirmation: positive ETCO2 and breath sounds checked- equal and bilateral Tube secured with: Tape Dental Injury: Teeth and Oropharynx as per pre-operative assessment

## 2013-07-15 NOTE — H&P (Signed)
Nicholas Horn is an 52 y.o. male.   Chief Complaint: Haglund's deformity right calcaneus HPI: Patient is a 52 year old gentleman with painful Haglund's deformity right calcaneus he has pain with activities of daily living has failed conservative care and presents at this time for open excision of the Haglund's deformity. Risks and benefits were discussed including infection neurovascular injury rupture of the Achilles need for additional surgery. Patient states he understands and wished to proceed at this time.  Past Medical History  Diagnosis Date  . Vision impairment     wears reading glasses  . EKG abnormality 2007    stress test and cardiac evaluation [facility] cardiology  . PONV (postoperative nausea and vomiting)   . Shortness of breath     with exertion  . Sleep apnea 07    Past Surgical History  Procedure Laterality Date  . Cardiac catheterization  2007    due to equivocal stress test.  normal cath per pt report  . Wisdom tooth extraction      age 64yo  . Back surgery  2014    Micro disectomy l3-4  . Knee arthroscopy Left 2014  . Colonoscopy      Family History  Problem Relation Age of Onset  . Heart disease Mother 29    died of heart disease, age 20yo  . Obesity Mother   . Hypertension Mother   . Hyperlipidemia Mother   . Cancer Father     died of stomach and intestinal cancer  . Heart disease Brother     CAD, stent  . Other Brother     substance abuse  . Diabetes Neg Hx   . Stroke Neg Hx   . Colon cancer Neg Hx    Social History:  reports that he has quit smoking. His smoking use included Cigarettes. He started smoking about 27 years ago. He smoked 0.00 packs per day for 7 years. He does not have any smokeless tobacco history on file. He reports that he does not drink alcohol or use illicit drugs.  Allergies:  Allergies  Allergen Reactions  . Honey     GI upset, nausea, throat itching    No prescriptions prior to admission    No results found for  this or any previous visit (from the past 48 hour(s)). No results found.  Review of Systems  All other systems reviewed and are negative.    There were no vitals taken for this visit. Physical Exam  On examination patient has a palpable dorsalis pedis pulse there is a large Haglund's deformity which is painful to palpation. Radiograph shows calcification within the Achilles as well as a large bony spur. Assessment/Plan Assessment: Haglund's deformity right calcaneus.  Plan: Will plan for open excision of the Haglund's deformity. Risks and benefits were discussed including infection neurovascular injury ruptured Achilles need for additional surgery. Patient states he understands and wished to proceed at this time.  Nicholas Horn V 07/15/2013, 6:47 AM

## 2013-07-15 NOTE — Anesthesia Postprocedure Evaluation (Signed)
Anesthesia Post Note  Patient: Nicholas Horn  Procedure(s) Performed: Procedure(s) (LRB): Resection right Haglund Deformity (Right)  Anesthesia type: General  Patient location: PACU  Post pain: Pain level controlled  Post assessment: Patient's Cardiovascular Status Stable  Last Vitals:  Filed Vitals:   07/15/13 1234  BP: 157/96  Pulse: 66  Temp: 36.6 C  Resp: 14    Post vital signs: Reviewed and stable  Level of consciousness: alert  Complications: No apparent anesthesia complications

## 2013-07-15 NOTE — Progress Notes (Signed)
Ortho Tech at bedside delivering crutches and CAM walker

## 2013-07-15 NOTE — Op Note (Signed)
OPERATIVE REPORT  DATE OF SURGERY: 07/15/2013  PATIENT:  Nicholas Horn,  52 y.o. male  PRE-OPERATIVE DIAGNOSIS:  Haglunds Right Foot with Achilles Tendonitis  POST-OPERATIVE DIAGNOSIS:  Haglunds Right Foot with Achilles Tendonitis  PROCEDURE:  Procedure(s): Resection right Haglund Deformity  SURGEON:  Surgeon(s): Nadara Mustard, MD  ANESTHESIA:   general  EBL:  min ML  SPECIMEN:  No Specimen  TOURNIQUET:  * No tourniquets in log *  PROCEDURE DETAILS: Patient is a 52 year old gentleman with a painful Haglund's deformity on the right. Patient has failed conservative therapy and presents at this time for Haglund's resection. Risks and benefits were discussed including infection neurovascular injury pain rupture of Achilles need for additional surgery. Patient states he understands and wished to proceed at this time. Description of procedure patient brought to the operating room and underwent a general anesthetic. After adequate levels of anesthesia were obtained patient's right lower extremity was prepped using DuraPrep and draped into a sterile field. A posterior medial incision was made adjacent to the Achilles this was carried down to bone. The Achilles tendon was protected and a osteotome was used to resect the Haglund's deformity. This provided good pressure relief off the Achilles. There is a very superficial a piece of the calcification within the Achilles this appeared to be in the mid substance and the Achilles was not debrided of this a mid substance mass this would have a debulked his Achilles excessively. The wound is irrigated with normal saline hemostasis was obtained. An Algower Donati suture technique was used to close the incision. The wound is covered Adaptic orthopedic sponges ABDs dressing Kerlix and Coban. Patient was extubated taken to the PACU in stable condition discharged to home followup in the office in a week  PLAN OF CARE: Discharge to home after PACU  PATIENT  DISPOSITION:  PACU - hemodynamically stable.   Nadara Mustard, MD 07/15/2013 11:34 AM

## 2013-07-15 NOTE — Anesthesia Preprocedure Evaluation (Addendum)
Anesthesia Evaluation  Patient identified by MRN, date of birth, ID band Patient awake    Reviewed: Allergy & Precautions, H&P , NPO status , Patient's Chart, lab work & pertinent test results, reviewed documented beta blocker date and time   History of Anesthesia Complications (+) PONV and history of anesthetic complications  Airway Mallampati: II TM Distance: >3 FB Neck ROM: full    Dental  (+) Teeth Intact and Dental Advisory Given   Pulmonary shortness of breath and with exertion, sleep apnea , former smoker,  breath sounds clear to auscultation        Cardiovascular negative cardio ROS  Rhythm:regular     Neuro/Psych  Headaches,  Neuromuscular disease negative neurological ROS  negative psych ROS   GI/Hepatic negative GI ROS, Neg liver ROS,   Endo/Other  Morbid obesity  Renal/GU negative Renal ROS  negative genitourinary   Musculoskeletal   Abdominal   Peds  Hematology negative hematology ROS (+)   Anesthesia Other Findings See surgeon's H&P   Reproductive/Obstetrics negative OB ROS                          Anesthesia Physical Anesthesia Plan  ASA: III  Anesthesia Plan: General   Post-op Pain Management:    Induction: Intravenous  Airway Management Planned: LMA  Additional Equipment:   Intra-op Plan:   Post-operative Plan:   Informed Consent: I have reviewed the patients History and Physical, chart, labs and discussed the procedure including the risks, benefits and alternatives for the proposed anesthesia with the patient or authorized representative who has indicated his/her understanding and acceptance.   Dental Advisory Given  Plan Discussed with: CRNA and Surgeon  Anesthesia Plan Comments:         Anesthesia Quick Evaluation

## 2013-07-16 ENCOUNTER — Encounter (HOSPITAL_COMMUNITY): Payer: Self-pay | Admitting: Orthopedic Surgery

## 2014-08-06 HISTORY — PX: EXTRACORPOREAL SHOCK WAVE LITHOTRIPSY: SHX1557

## 2014-08-06 HISTORY — PX: CYSTOSCOPY W/ URETERAL STENT PLACEMENT: SHX1429

## 2014-10-01 ENCOUNTER — Emergency Department (HOSPITAL_COMMUNITY): Payer: Medicaid Other | Admitting: Certified Registered"

## 2014-10-01 ENCOUNTER — Emergency Department (HOSPITAL_COMMUNITY): Payer: Medicaid Other

## 2014-10-01 ENCOUNTER — Observation Stay (HOSPITAL_COMMUNITY)
Admission: EM | Admit: 2014-10-01 | Discharge: 2014-10-02 | Disposition: A | Discharge status: Home / Self Care | Payer: Medicaid Other | Attending: General Surgery | Admitting: General Surgery

## 2014-10-01 ENCOUNTER — Encounter (HOSPITAL_COMMUNITY)
Admission: EM | Disposition: A | Discharge status: Home / Self Care | Payer: Self-pay | Source: Home / Self Care | Attending: Emergency Medicine

## 2014-10-01 ENCOUNTER — Encounter (HOSPITAL_COMMUNITY): Payer: Self-pay | Admitting: *Deleted

## 2014-10-01 DIAGNOSIS — G4730 Sleep apnea, unspecified: Secondary | ICD-10-CM | POA: Insufficient documentation

## 2014-10-01 DIAGNOSIS — Z6837 Body mass index (BMI) 37.0-37.9, adult: Secondary | ICD-10-CM | POA: Diagnosis not present

## 2014-10-01 DIAGNOSIS — K3580 Unspecified acute appendicitis: Secondary | ICD-10-CM | POA: Diagnosis not present

## 2014-10-01 DIAGNOSIS — R109 Unspecified abdominal pain: Secondary | ICD-10-CM | POA: Diagnosis present

## 2014-10-01 DIAGNOSIS — Z87891 Personal history of nicotine dependence: Secondary | ICD-10-CM | POA: Insufficient documentation

## 2014-10-01 DIAGNOSIS — Z9049 Acquired absence of other specified parts of digestive tract: Secondary | ICD-10-CM

## 2014-10-01 HISTORY — PX: LAPAROSCOPIC APPENDECTOMY: SHX408

## 2014-10-01 LAB — COMPREHENSIVE METABOLIC PANEL
ALT: 31 U/L (ref 0–53)
AST: 27 U/L (ref 0–37)
Albumin: 4.1 g/dL (ref 3.5–5.2)
Alkaline Phosphatase: 80 U/L (ref 39–117)
Anion gap: 8 (ref 5–15)
BILIRUBIN TOTAL: 0.9 mg/dL (ref 0.3–1.2)
BUN: 9 mg/dL (ref 6–23)
CALCIUM: 9.5 mg/dL (ref 8.4–10.5)
CHLORIDE: 104 mmol/L (ref 96–112)
CO2: 27 mmol/L (ref 19–32)
Creatinine, Ser: 1.18 mg/dL (ref 0.50–1.35)
GFR calc Af Amer: 80 mL/min — ABNORMAL LOW (ref >90)
GFR, EST NON AFRICAN AMERICAN: 69 mL/min — AB (ref >90)
GLUCOSE: 102 mg/dL — AB (ref 70–99)
Potassium: 3.5 mmol/L (ref 3.5–5.1)
Sodium: 139 mmol/L (ref 135–145)
Total Protein: 7.4 g/dL (ref 6.0–8.3)

## 2014-10-01 LAB — URINALYSIS, ROUTINE W REFLEX MICROSCOPIC
BILIRUBIN URINE: NEGATIVE
Glucose, UA: NEGATIVE mg/dL
Ketones, ur: NEGATIVE mg/dL
Leukocytes, UA: NEGATIVE
Nitrite: NEGATIVE
PH: 6.5 (ref 5.0–8.0)
Protein, ur: NEGATIVE mg/dL
Specific Gravity, Urine: 1.013 (ref 1.005–1.030)
UROBILINOGEN UA: 1 mg/dL (ref 0.0–1.0)

## 2014-10-01 LAB — CBC WITH DIFFERENTIAL/PLATELET
BASOS PCT: 0 % (ref 0–1)
Basophils Absolute: 0.1 10*3/uL (ref 0.0–0.1)
EOS ABS: 0.4 10*3/uL (ref 0.0–0.7)
EOS PCT: 3 % (ref 0–5)
HCT: 42 % (ref 39.0–52.0)
Hemoglobin: 14.8 g/dL (ref 13.0–17.0)
LYMPHS ABS: 2.1 10*3/uL (ref 0.7–4.0)
Lymphocytes Relative: 17 % (ref 12–46)
MCH: 31.3 pg (ref 26.0–34.0)
MCHC: 35.2 g/dL (ref 30.0–36.0)
MCV: 88.8 fL (ref 78.0–100.0)
MONO ABS: 0.5 10*3/uL (ref 0.1–1.0)
MONOS PCT: 4 % (ref 3–12)
Neutro Abs: 9.2 10*3/uL — ABNORMAL HIGH (ref 1.7–7.7)
Neutrophils Relative %: 76 % (ref 43–77)
PLATELETS: 200 10*3/uL (ref 150–400)
RBC: 4.73 MIL/uL (ref 4.22–5.81)
RDW: 12.2 % (ref 11.5–15.5)
WBC: 12.3 10*3/uL — AB (ref 4.0–10.5)

## 2014-10-01 LAB — URINE MICROSCOPIC-ADD ON

## 2014-10-01 LAB — LIPASE, BLOOD: Lipase: 37 U/L (ref 11–59)

## 2014-10-01 SURGERY — APPENDECTOMY, LAPAROSCOPIC
Anesthesia: General | Site: Abdomen

## 2014-10-01 MED ORDER — HYDROMORPHONE HCL 1 MG/ML IJ SOLN
INTRAMUSCULAR | Status: AC
  Administered 2014-10-01: 1 mg via INTRAVENOUS
  Filled 2014-10-01: qty 1

## 2014-10-01 MED ORDER — METRONIDAZOLE IN NACL 5-0.79 MG/ML-% IV SOLN
500.0000 mg | Freq: Once | INTRAVENOUS | Status: AC
  Administered 2014-10-01: 500 mg via INTRAVENOUS
  Filled 2014-10-01: qty 100

## 2014-10-01 MED ORDER — MIDAZOLAM HCL 2 MG/2ML IJ SOLN
INTRAMUSCULAR | Status: AC
  Filled 2014-10-01: qty 2

## 2014-10-01 MED ORDER — LACTATED RINGERS IV SOLN
INTRAVENOUS | Status: DC | PRN
  Administered 2014-10-01: 23:00:00 via INTRAVENOUS

## 2014-10-01 MED ORDER — FENTANYL CITRATE 0.05 MG/ML IJ SOLN
INTRAMUSCULAR | Status: AC
  Filled 2014-10-01: qty 5

## 2014-10-01 MED ORDER — HYDROMORPHONE HCL 1 MG/ML IJ SOLN
1.0000 mg | Freq: Once | INTRAMUSCULAR | Status: AC
  Administered 2014-10-01: 1 mg via INTRAVENOUS
  Filled 2014-10-01: qty 1

## 2014-10-01 MED ORDER — LIDOCAINE HCL (CARDIAC) 20 MG/ML IV SOLN
INTRAVENOUS | Status: DC | PRN
  Administered 2014-10-01: 80 mg via INTRAVENOUS

## 2014-10-01 MED ORDER — ONDANSETRON HCL 4 MG/2ML IJ SOLN: 4.0000 mg | Freq: Once | INTRAMUSCULAR | Status: DC

## 2014-10-01 MED ORDER — SUCCINYLCHOLINE CHLORIDE 20 MG/ML IJ SOLN
INTRAMUSCULAR | Status: DC | PRN
  Administered 2014-10-01: 180 mg via INTRAVENOUS

## 2014-10-01 MED ORDER — IOHEXOL 300 MG/ML  SOLN
25.0000 mL | INTRAMUSCULAR | Status: AC
  Administered 2014-10-01: 25 mL via ORAL

## 2014-10-01 MED ORDER — PROPOFOL 10 MG/ML IV BOLUS
INTRAVENOUS | Status: AC
  Filled 2014-10-01: qty 20

## 2014-10-01 MED ORDER — SODIUM CHLORIDE 0.9 % IR SOLN
Status: DC | PRN
  Administered 2014-10-01: 1

## 2014-10-01 MED ORDER — BUPIVACAINE-EPINEPHRINE (PF) 0.25% -1:200000 IJ SOLN
INTRAMUSCULAR | Status: AC
  Filled 2014-10-01: qty 30

## 2014-10-01 MED ORDER — ONDANSETRON HCL 4 MG/2ML IJ SOLN
4.0000 mg | Freq: Once | INTRAMUSCULAR | Status: AC
  Administered 2014-10-01: 4 mg via INTRAVENOUS
  Filled 2014-10-01: qty 2

## 2014-10-01 MED ORDER — ROCURONIUM BROMIDE 100 MG/10ML IV SOLN
INTRAVENOUS | Status: DC | PRN
  Administered 2014-10-01: 30 mg via INTRAVENOUS

## 2014-10-01 MED ORDER — PROPOFOL 10 MG/ML IV BOLUS
INTRAVENOUS | Status: DC | PRN
  Administered 2014-10-01: 200 mg via INTRAVENOUS

## 2014-10-01 MED ORDER — IOHEXOL 300 MG/ML  SOLN
100.0000 mL | Freq: Once | INTRAMUSCULAR | Status: AC | PRN
  Administered 2014-10-01: 100 mL via INTRAVENOUS

## 2014-10-01 MED ORDER — BUPIVACAINE-EPINEPHRINE 0.25% -1:200000 IJ SOLN
INTRAMUSCULAR | Status: DC | PRN
Start: 2014-10-01 — End: 2014-10-02
  Administered 2014-10-01: 10 mL

## 2014-10-01 MED ORDER — DEXTROSE 5 % IV SOLN
2.0000 g | Freq: Once | INTRAVENOUS | Status: AC
  Administered 2014-10-01: 2 g via INTRAVENOUS
  Filled 2014-10-01: qty 2

## 2014-10-01 MED ORDER — MIDAZOLAM HCL 5 MG/5ML IJ SOLN
INTRAMUSCULAR | Status: DC | PRN
Start: 2014-10-01 — End: 2014-10-02
  Administered 2014-10-01: 2 mg via INTRAVENOUS

## 2014-10-01 MED ORDER — MORPHINE SULFATE 4 MG/ML IJ SOLN
4.0000 mg | Freq: Once | INTRAMUSCULAR | Status: AC
Start: 2014-10-01 — End: 2014-10-01
  Administered 2014-10-01: 4 mg via INTRAVENOUS
  Filled 2014-10-01: qty 1

## 2014-10-01 MED ORDER — SODIUM CHLORIDE 0.9 % IV BOLUS (SEPSIS)
1000.0000 mL | Freq: Once | INTRAVENOUS | Status: AC
  Administered 2014-10-01: 1000 mL via INTRAVENOUS

## 2014-10-01 MED ORDER — 0.9 % SODIUM CHLORIDE (POUR BTL) OPTIME
TOPICAL | Status: DC | PRN
  Administered 2014-10-01: 1000 mL

## 2014-10-01 MED ORDER — FENTANYL CITRATE 0.05 MG/ML IJ SOLN
INTRAMUSCULAR | Status: DC | PRN
  Administered 2014-10-01: 50 ug via INTRAVENOUS

## 2014-10-01 MED ORDER — LIDOCAINE HCL (CARDIAC) 20 MG/ML IV SOLN: INTRAVENOUS | Status: DC | PRN

## 2014-10-01 MED ORDER — HYDROMORPHONE HCL 1 MG/ML IJ SOLN
1.0000 mg | Freq: Once | INTRAMUSCULAR | Status: AC
  Administered 2014-10-01: 1 mg via INTRAVENOUS

## 2014-10-01 MED ORDER — MORPHINE SULFATE 4 MG/ML IJ SOLN: 4.0000 mg | Freq: Once | INTRAMUSCULAR | Status: DC

## 2014-10-01 SURGICAL SUPPLY — 39 items
APPLIER CLIP ROT 10 11.4 M/L (STAPLE)
BLADE SURG ROTATE 9660 (MISCELLANEOUS) ×2 IMPLANT
CANISTER SUCTION 2500CC (MISCELLANEOUS) ×2 IMPLANT
CHLORAPREP W/TINT 26ML (MISCELLANEOUS) ×2 IMPLANT
CLIP APPLIE ROT 10 11.4 M/L (STAPLE) IMPLANT
COVER SURGICAL LIGHT HANDLE (MISCELLANEOUS) ×2 IMPLANT
CUTTER LINEAR ENDO 35 ART THIN (STAPLE) ×2 IMPLANT
CUTTER LINEAR ENDO 35 ETS (STAPLE) IMPLANT
DRAPE LAPAROSCOPIC ABDOMINAL (DRAPES) ×2 IMPLANT
ELECT REM PT RETURN 9FT ADLT (ELECTROSURGICAL) ×2
ELECTRODE REM PT RTRN 9FT ADLT (ELECTROSURGICAL) ×1 IMPLANT
GLOVE BIO SURGEON STRL SZ8 (GLOVE) ×2 IMPLANT
GLOVE BIOGEL PI IND STRL 8 (GLOVE) ×1 IMPLANT
GLOVE BIOGEL PI INDICATOR 8 (GLOVE) ×1
GOWN STRL REUS W/ TWL LRG LVL3 (GOWN DISPOSABLE) IMPLANT
GOWN STRL REUS W/ TWL XL LVL3 (GOWN DISPOSABLE) ×2 IMPLANT
GOWN STRL REUS W/TWL LRG LVL3 (GOWN DISPOSABLE)
GOWN STRL REUS W/TWL XL LVL3 (GOWN DISPOSABLE) ×2
KIT BASIN OR (CUSTOM PROCEDURE TRAY) ×2 IMPLANT
KIT ROOM TURNOVER OR (KITS) ×2 IMPLANT
LIQUID BAND (GAUZE/BANDAGES/DRESSINGS) ×2 IMPLANT
NS IRRIG 1000ML POUR BTL (IV SOLUTION) ×2 IMPLANT
PAD ARMBOARD 7.5X6 YLW CONV (MISCELLANEOUS) ×4 IMPLANT
POUCH SPECIMEN RETRIEVAL 10MM (ENDOMECHANICALS) ×2 IMPLANT
RELOAD /EVU35 (ENDOMECHANICALS) IMPLANT
RELOAD CUTTER ETS 35MM STAND (ENDOMECHANICALS) IMPLANT
SCALPEL HARMONIC ACE (MISCELLANEOUS) ×2 IMPLANT
SET IRRIG TUBING LAPAROSCOPIC (IRRIGATION / IRRIGATOR) ×2 IMPLANT
SPECIMEN JAR SMALL (MISCELLANEOUS) ×2 IMPLANT
SUT VIC AB 4-0 PS2 27 (SUTURE) ×2 IMPLANT
SUT VICRYL 0 UR6 27IN ABS (SUTURE) ×4 IMPLANT
TOWEL OR 17X24 6PK STRL BLUE (TOWEL DISPOSABLE) ×2 IMPLANT
TOWEL OR 17X26 10 PK STRL BLUE (TOWEL DISPOSABLE) ×2 IMPLANT
TRAY FOLEY CATH 16FR SILVER (SET/KITS/TRAYS/PACK) ×2 IMPLANT
TRAY LAPAROSCOPIC (CUSTOM PROCEDURE TRAY) ×2 IMPLANT
TROCAR XCEL 12X100 BLDLESS (ENDOMECHANICALS) ×2 IMPLANT
TROCAR XCEL BLUNT TIP 100MML (ENDOMECHANICALS) ×2 IMPLANT
TROCAR XCEL NON-BLD 5MMX100MML (ENDOMECHANICALS) ×2 IMPLANT
TUBING INSUFFLATION (TUBING) ×2 IMPLANT

## 2014-10-01 NOTE — Anesthesia Procedure Notes (Signed)
Procedure Name: Intubation Date/Time: 10/01/2014 11:27 PM Performed by: Manuela Schwartz B Pre-anesthesia Checklist: Patient identified, Emergency Drugs available, Suction available, Patient being monitored and Timeout performed Patient Re-evaluated:Patient Re-evaluated prior to inductionOxygen Delivery Method: Circle system utilized Preoxygenation: Pre-oxygenation with 100% oxygen Intubation Type: IV induction and Rapid sequence Laryngoscope Size: Mac and 4 Grade View: Grade I Tube type: Oral Tube size: 7.5 mm Number of attempts: 1 Airway Equipment and Method: Stylet Placement Confirmation: positive ETCO2,  ETT inserted through vocal cords under direct vision and breath sounds checked- equal and bilateral Secured at: 23 cm Tube secured with: Tape Dental Injury: Teeth and Oropharynx as per pre-operative assessment

## 2014-10-01 NOTE — Anesthesia Preprocedure Evaluation (Addendum)
Anesthesia Evaluation  Patient identified by MRN, date of birth, ID band Patient awake    Reviewed: Allergy & Precautions, NPO status , Patient's Chart, lab work & pertinent test results  History of Anesthesia Complications (+) PONV and history of anesthetic complications  Airway Mallampati: II  TM Distance: >3 FB Neck ROM: Full    Dental no notable dental hx. (+) Dental Advisory Given   Pulmonary sleep apnea , former smoker,  breath sounds clear to auscultation  Pulmonary exam normal       Cardiovascular negative cardio ROS  Rhythm:Regular Rate:Normal     Neuro/Psych negative neurological ROS  negative psych ROS   GI/Hepatic negative GI ROS, Neg liver ROS,   Endo/Other  Morbid obesity  Renal/GU negative Renal ROS  negative genitourinary   Musculoskeletal negative musculoskeletal ROS (+)   Abdominal   Peds negative pediatric ROS (+)  Hematology negative hematology ROS (+)   Anesthesia Other Findings   Reproductive/Obstetrics negative OB ROS                           Anesthesia Physical Anesthesia Plan  ASA: II and emergent  Anesthesia Plan: General   Post-op Pain Management:    Induction: Intravenous and Rapid sequence  Airway Management Planned: Oral ETT  Additional Equipment:   Intra-op Plan:   Post-operative Plan: Extubation in OR  Informed Consent: I have reviewed the patients History and Physical, chart, labs and discussed the procedure including the risks, benefits and alternatives for the proposed anesthesia with the patient or authorized representative who has indicated his/her understanding and acceptance.   Dental advisory given  Plan Discussed with: Surgeon and CRNA  Anesthesia Plan Comments:        Anesthesia Quick Evaluation

## 2014-10-01 NOTE — ED Provider Notes (Signed)
CSN: [id number]     Arrival date & time 10/01/14  1855 History   First MD Initiated Contact with Patient 10/01/14 2042     Chief Complaint  Patient presents with  . Abdominal Pain     (Consider location/radiation/quality/duration/timing/severity/associated sxs/prior Treatment) HPI Comments: Patient is a 54 year old male presenting to the emergency department for acute onset right lower quadrant pain that began at 4 PM this afternoon. He states started periumbilical and then began to migrate around his right lower quadrant and became increasingly more severe. He endorses severe nausea without vomiting. No modifying factors identified. Denies any fevers, chills, vomiting, diarrhea. Patient last ate at 11 AM this morning.   Past Medical History  Diagnosis Date  . Vision impairment     wears reading glasses  . EKG abnormality 2007    stress test and cardiac evaluation [facility] cardiology  . PONV (postoperative nausea and vomiting)   . Shortness of breath     with exertion  . Sleep apnea 07   Past Surgical History  Procedure Laterality Date  . Cardiac catheterization  2007    due to equivocal stress test.  normal cath per pt report  . Wisdom tooth extraction      age 63yo  . Back surgery  2014    Micro disectomy l3-4  . Knee arthroscopy Left 2014  . Colonoscopy    . Excision haglund's deformity with achilles tendon repair Right 07/15/2013    Procedure: Resection right Haglund Deformity;  Surgeon: Newt Minion, MD;  Location: [place];  Service: Orthopedics;  Laterality: Right;  Resection right Haglund Deformity   Family History  Problem Relation Age of Onset  . Heart disease Mother 40    died of heart disease, age 72yo  . Obesity Mother   . Hypertension Mother   . Hyperlipidemia Mother   . Cancer Father     died of stomach and intestinal cancer  . Heart disease Brother     CAD, stent  . Other Brother     substance abuse  . Diabetes Neg Hx   . Stroke Neg Hx   . Colon  cancer Neg Hx    History  Substance Use Topics  . Smoking status: Former Smoker -- 7 years    Types: Cigarettes    Start date: 09/06/1985  . Smokeless tobacco: Not on file     Comment: quit in 1987  . Alcohol Use: No    Review of Systems  Gastrointestinal: Positive for nausea and abdominal pain. Negative for vomiting and diarrhea.  All other systems reviewed and are negative.     Allergies  Honey  Home Medications   Prior to Admission medications   Medication Sig Start Date End Date Taking? Authorizing Provider  oxyCODONE-acetaminophen (ROXICET) 5-325 MG per tablet Take 1 tablet by mouth every 4 (four) hours as needed for severe pain. 07/15/13  Yes Newt Minion, MD  oxyCODONE-acetaminophen (PERCOCET) 5-325 MG per tablet Take 1 tablet by mouth every 8 (eight) hours as needed. Patient not taking: Reported on 10/01/2014 07/01/13   Doe-Hyun R Shawna Orleans, DO  pravastatin (PRAVACHOL) 40 MG tablet Take 1 tablet (40 mg total) by mouth daily. Patient not taking: Reported on 10/01/2014 07/01/13   Doe-Hyun R Shawna Orleans, DO   BP 135/69 mmHg  Pulse 75  Temp(Src) 98.4 F (36.9 C) (Oral)  Resp 20  SpO2 93% Physical Exam  Constitutional: He is oriented to person, place, and time. He appears well-developed and well-nourished. No  distress.  HENT:  Head: Normocephalic and atraumatic.  Right Ear: External ear normal.  Left Ear: External ear normal.  Nose: Nose normal.  Mouth/Throat: Oropharynx is clear and moist. No oropharyngeal exudate.  Eyes: Conjunctivae are normal.  Neck: Neck supple.  Cardiovascular: Normal rate, regular rhythm and normal heart sounds.   Abdominal: Soft. Bowel sounds are normal. He exhibits no distension. There is tenderness in the right lower quadrant. There is tenderness at McBurney's point. There is no rigidity, no rebound, no guarding and negative Murphy's sign.  Neurological: He is alert and oriented to person, place, and time.  Skin: Skin is warm and dry. He is not  diaphoretic.  Nursing note and vitals reviewed.   ED Course  Procedures (including critical care time) Medications  ondansetron (ZOFRAN) injection 4 mg (not administered)  cefTRIAXone (ROCEPHIN) 2 g in dextrose 5 % 50 mL IVPB (2 g Intravenous New Bag/Given 10/01/14 2250)  metroNIDAZOLE (FLAGYL) IVPB 500 mg (not administered)  iohexol (OMNIPAQUE) 300 MG/ML solution 25 mL (25 mLs Oral Contrast Given 10/01/14 2057)  morphine 4 MG/ML injection 4 mg (4 mg Intravenous Given 10/01/14 2102)  ondansetron (ZOFRAN) injection 4 mg (4 mg Intravenous Given 10/01/14 2059)  sodium chloride 0.9 % bolus 1,000 mL (1,000 mLs Intravenous New Bag/Given 10/01/14 2146)  iohexol (OMNIPAQUE) 300 MG/ML solution 100 mL (100 mLs Intravenous Contrast Given 10/01/14 2113)  HYDROmorphone (DILAUDID) injection 1 mg (1 mg Intravenous Given 10/01/14 2146)  HYDROmorphone (DILAUDID) injection 1 mg (1 mg Intravenous Given 10/01/14 2241)    Labs Review Labs Reviewed  CBC WITH DIFFERENTIAL/PLATELET - Abnormal; Notable for the following:    WBC 12.3 (*)    Neutro Abs 9.2 (*)    All other components within normal limits  COMPREHENSIVE METABOLIC PANEL - Abnormal; Notable for the following:    Glucose, Bld 102 (*)    GFR calc non Af Amer 69 (*)    GFR calc Af Amer 80 (*)    All other components within normal limits  URINALYSIS, ROUTINE W REFLEX MICROSCOPIC - Abnormal; Notable for the following:    Hgb urine dipstick TRACE (*)    All other components within normal limits  LIPASE, BLOOD  URINE MICROSCOPIC-ADD ON    Imaging Review Ct Abdomen Pelvis W Contrast  10/01/2014   CLINICAL DATA:  Right-sided abdominal pain and nausea since 1600 hours today.  EXAM: CT ABDOMEN AND PELVIS WITH CONTRAST  TECHNIQUE: Multidetector CT imaging of the abdomen and pelvis was performed using the standard protocol following bolus administration of intravenous contrast.  CONTRAST:  142mL OMNIPAQUE IOHEXOL 300 MG/ML  SOLN  COMPARISON:  None.  FINDINGS:  Mild dependent atelectasis in the lung bases.  The liver, spleen, gallbladder, pancreas, adrenal glands, abdominal aorta, inferior vena cava, and retroperitoneal lymph nodes are unremarkable. Small calcifications in both kidneys consistent with intrarenal stones. No hydronephrosis or hydroureter. Subcentimeter parenchymal low-attenuation lesions in both kidneys likely representing cysts. No solid mass in either kidney. Stomach, small bowel, and colon are not abnormally distended. No free air or free fluid in the abdomen.  Pelvis: The appendix is mildly distended and fluid filled with mild appendiceal wall thickening and periappendiceal stranding suggesting early acute appendicitis. No evidence of abscess or periappendiceal fluid collection. Prostate gland is not enlarged. Bladder wall is not thickened. No free or loculated pelvic fluid collections. No pelvic mass or lymphadenopathy. Degenerative changes in the lumbar spine. No destructive bone lesions.  IMPRESSION: Inflammatory changes in the appendix consistent with early acute  appendicitis. No abscess.   Electronically Signed   By: Lucienne Capers M.D.   On: 10/01/2014 21:36     EKG Interpretation None      MDM   Final diagnoses:  Abdominal pain in male  Acute appendicitis, unspecified acute appendicitis type    Filed Vitals:   10/01/14 2230  BP: 135/69  Pulse: 75  Temp:   Resp:    I have reviewed nursing notes, vital signs, and all appropriate lab and imaging results for this patient.  Abdomen soft, tender in RLQ at McBurney's point without peritoneal signs. Mild leukocytosis noted. CT abd/pelvis shows acute early appendicitis. Dr. Grandville Silos from surgery consulted will take the patient to the OR. Patient d/w with Dr. Darl Householder, agrees with plan.     Harlow Mares, PA-C 10/02/14 0108  Wandra Arthurs, MD 10/05/14 [phone]

## 2014-10-01 NOTE — H&P (Signed)
Nicholas Horn is an 54 y.o. male.   Chief Complaint: Right lower quadrant abdominal pain and nausea HPI: Larnce developed mid abdominal pain around 4 PM today. The pain gradually moved & localized in his right lower quadrant. He had associated severe nausea but did not vomit. He came to the emergency department for evaluation. Laboratory studies revealed mild leukocytosis. CT scan of the abdomen and pelvis shows acute appendicitis without evidence of perforation.  Past Medical History  Diagnosis Date  . Vision impairment     wears reading glasses  . EKG abnormality 2007    stress test and cardiac evaluation [facility] cardiology  . PONV (postoperative nausea and vomiting)   . Shortness of breath     with exertion  . Sleep apnea 07    Past Surgical History  Procedure Laterality Date  . Cardiac catheterization  2007    due to equivocal stress test.  normal cath per pt report  . Wisdom tooth extraction      age 86yo  . Back surgery  2014    Micro disectomy l3-4  . Knee arthroscopy Left 2014  . Colonoscopy    . Excision haglund's deformity with achilles tendon repair Right 07/15/2013    Procedure: Resection right Haglund Deformity;  Surgeon: Newt Minion, MD;  Location: [place];  Service: Orthopedics;  Laterality: Right;  Resection right Haglund Deformity    Family History  Problem Relation Age of Onset  . Heart disease Mother 11    died of heart disease, age 85yo  . Obesity Mother   . Hypertension Mother   . Hyperlipidemia Mother   . Cancer Father     died of stomach and intestinal cancer  . Heart disease Brother     CAD, stent  . Other Brother     substance abuse  . Diabetes Neg Hx   . Stroke Neg Hx   . Colon cancer Neg Hx    Social History:  reports that he has quit smoking. His smoking use included Cigarettes. He started smoking about 29 years ago. He quit after 7 years of use. He does not have any smokeless tobacco history on file. He reports that he does not drink  alcohol or use illicit drugs.  Allergies:  Allergies  Allergen Reactions  . Honey     GI upset, nausea, throat itching     (Not in a hospital admission)  Results for orders placed or performed during the hospital encounter of 10/01/14 (from the past 48 hour(s))  CBC with Differential     Status: Abnormal   Collection Time: 10/01/14  7:09 PM  Result Value Ref Range   WBC 12.3 (H) 4.0 - 10.5 K/uL   RBC 4.73 4.22 - 5.81 MIL/uL   Hemoglobin 14.8 13.0 - 17.0 g/dL   HCT 42.0 39.0 - 52.0 %   MCV 88.8 78.0 - 100.0 fL   MCH 31.3 26.0 - 34.0 pg   MCHC 35.2 30.0 - 36.0 g/dL   RDW 12.2 11.5 - 15.5 %   Platelets 200 150 - 400 K/uL   Neutrophils Relative % 76 43 - 77 %   Neutro Abs 9.2 (H) 1.7 - 7.7 K/uL   Lymphocytes Relative 17 12 - 46 %   Lymphs Abs 2.1 0.7 - 4.0 K/uL   Monocytes Relative 4 3 - 12 %   Monocytes Absolute 0.5 0.1 - 1.0 K/uL   Eosinophils Relative 3 0 - 5 %   Eosinophils Absolute 0.4 0.0 -  0.7 K/uL   Basophils Relative 0 0 - 1 %   Basophils Absolute 0.1 0.0 - 0.1 K/uL  Comprehensive metabolic panel     Status: Abnormal   Collection Time: 10/01/14  7:09 PM  Result Value Ref Range   Sodium 139 135 - 145 mmol/L   Potassium 3.5 3.5 - 5.1 mmol/L   Chloride 104 96 - 112 mmol/L   CO2 27 19 - 32 mmol/L   Glucose, Bld 102 (H) 70 - 99 mg/dL   BUN 9 6 - 23 mg/dL   Creatinine, Ser 1.18 0.50 - 1.35 mg/dL   Calcium 9.5 8.4 - 10.5 mg/dL   Total Protein 7.4 6.0 - 8.3 g/dL   Albumin 4.1 3.5 - 5.2 g/dL   AST 27 0 - 37 U/L   ALT 31 0 - 53 U/L   Alkaline Phosphatase 80 39 - 117 U/L   Total Bilirubin 0.9 0.3 - 1.2 mg/dL   GFR calc non Af Amer 69 (L) >90 mL/min   GFR calc Af Amer 80 (L) >90 mL/min    Comment: (NOTE) The eGFR has been calculated using the CKD EPI equation. This calculation has not been validated in all clinical situations. eGFR's persistently <90 mL/min signify possible Chronic Kidney Disease.    Anion gap 8 5 - 15  Lipase, blood     Status: None    Collection Time: 10/01/14  7:09 PM  Result Value Ref Range   Lipase 37 11 - 59 U/L  Urinalysis, Routine w reflex microscopic     Status: Abnormal   Collection Time: 10/01/14  7:21 PM  Result Value Ref Range   Color, Urine YELLOW YELLOW   APPearance CLEAR CLEAR   Specific Gravity, Urine 1.013 1.005 - 1.030   pH 6.5 5.0 - 8.0   Glucose, UA NEGATIVE NEGATIVE mg/dL   Hgb urine dipstick TRACE (A) NEGATIVE   Bilirubin Urine NEGATIVE NEGATIVE   Ketones, ur NEGATIVE NEGATIVE mg/dL   Protein, ur NEGATIVE NEGATIVE mg/dL   Urobilinogen, UA 1.0 0.0 - 1.0 mg/dL   Nitrite NEGATIVE NEGATIVE   Leukocytes, UA NEGATIVE NEGATIVE  Urine microscopic-add on     Status: None   Collection Time: 10/01/14  7:21 PM  Result Value Ref Range   WBC, UA 0-2 <3 WBC/hpf   RBC / HPF 0-2 <3 RBC/hpf   Urine-Other AMORPHOUS URATES/PHOSPHATES    Ct Abdomen Pelvis W Contrast  10/01/2014   CLINICAL DATA:  Right-sided abdominal pain and nausea since 1600 hours today.  EXAM: CT ABDOMEN AND PELVIS WITH CONTRAST  TECHNIQUE: Multidetector CT imaging of the abdomen and pelvis was performed using the standard protocol following bolus administration of intravenous contrast.  CONTRAST:  192mL OMNIPAQUE IOHEXOL 300 MG/ML  SOLN  COMPARISON:  None.  FINDINGS: Mild dependent atelectasis in the lung bases.  The liver, spleen, gallbladder, pancreas, adrenal glands, abdominal aorta, inferior vena cava, and retroperitoneal lymph nodes are unremarkable. Small calcifications in both kidneys consistent with intrarenal stones. No hydronephrosis or hydroureter. Subcentimeter parenchymal low-attenuation lesions in both kidneys likely representing cysts. No solid mass in either kidney. Stomach, small bowel, and colon are not abnormally distended. No free air or free fluid in the abdomen.  Pelvis: The appendix is mildly distended and fluid filled with mild appendiceal wall thickening and periappendiceal stranding suggesting early acute appendicitis. No  evidence of abscess or periappendiceal fluid collection. Prostate gland is not enlarged. Bladder wall is not thickened. No free or loculated pelvic fluid collections. No pelvic mass  or lymphadenopathy. Degenerative changes in the lumbar spine. No destructive bone lesions.  IMPRESSION: Inflammatory changes in the appendix consistent with early acute appendicitis. No abscess.   Electronically Signed   By: Lucienne Capers M.D.   On: 10/01/2014 21:36    Review of Systems  Constitutional: Positive for malaise/fatigue.  HENT: Negative.   Eyes: Negative.   Respiratory: Negative.   Cardiovascular: Negative.   Gastrointestinal: Positive for nausea and abdominal pain. Negative for vomiting.  Genitourinary: Negative.   Skin: Negative.   Neurological: Negative.   Endo/Heme/Allergies: Negative.     Blood pressure 135/69, pulse 75, temperature 98.4 F (36.9 C), temperature source Oral, resp. rate 20, SpO2 93 %. Physical Exam  Constitutional: He is oriented to person, place, and time. He appears well-developed and well-nourished. No distress.  HENT:  Head: Normocephalic and atraumatic.  Right Ear: External ear normal.  Left Ear: External ear normal.  Mouth/Throat: No oropharyngeal exudate.  Eyes: Pupils are equal, round, and reactive to light. Right eye exhibits no discharge. Left eye exhibits no discharge.  Neck: Neck supple. No tracheal deviation present.  Cardiovascular: Normal rate, normal heart sounds and intact distal pulses.   Respiratory: Breath sounds normal. No stridor. No respiratory distress. He has no wheezes. He has no rales. He exhibits no tenderness.  GI: Soft. He exhibits no distension. There is tenderness. There is no rebound and no guarding.  Tender right lower quadrant, positive Rosvig's sign  Musculoskeletal: Normal range of motion. He exhibits no edema or tenderness.  Neurological: He is alert and oriented to person, place, and time. He exhibits normal muscle tone.  Skin:  Skin is warm and dry.  Psychiatric: He has a normal mood and affect.     Assessment/Plan Acute appendicitis - we'll give IV antibiotics and take to the operative room for laparoscopic appendectomy. Procedure, risks, benefits were discussed in detail with him. I answered his questions. He is agreeable.  Bunny Kleist E 10/01/2014, 10:45 PM

## 2014-10-01 NOTE — ED Notes (Signed)
Pt reports sudden onset today of severe RLQ pain. Denies n/v/d.

## 2014-10-02 DIAGNOSIS — Z87891 Personal history of nicotine dependence: Secondary | ICD-10-CM | POA: Diagnosis not present

## 2014-10-02 DIAGNOSIS — K3580 Unspecified acute appendicitis: Secondary | ICD-10-CM | POA: Diagnosis not present

## 2014-10-02 DIAGNOSIS — G4730 Sleep apnea, unspecified: Secondary | ICD-10-CM | POA: Diagnosis not present

## 2014-10-02 DIAGNOSIS — Z9049 Acquired absence of other specified parts of digestive tract: Secondary | ICD-10-CM

## 2014-10-02 MED ORDER — KETOROLAC TROMETHAMINE 30 MG/ML IJ SOLN
INTRAMUSCULAR | Status: AC
  Filled 2014-10-02: qty 1

## 2014-10-02 MED ORDER — ONDANSETRON HCL 4 MG PO TABS: 4.0000 mg | ORAL_TABLET | Freq: Four times a day (QID) | ORAL | Status: DC | PRN

## 2014-10-02 MED ORDER — KETOROLAC TROMETHAMINE 30 MG/ML IJ SOLN
30.0000 mg | Freq: Once | INTRAMUSCULAR | Status: AC | PRN
  Administered 2014-10-02: 30 mg via INTRAVENOUS

## 2014-10-02 MED ORDER — ONDANSETRON HCL 4 MG/2ML IJ SOLN
INTRAMUSCULAR | Status: DC | PRN
  Administered 2014-10-02: 4 mg via INTRAVENOUS

## 2014-10-02 MED ORDER — HYDROMORPHONE HCL 1 MG/ML IJ SOLN
0.2500 mg | INTRAMUSCULAR | Status: DC | PRN
  Administered 2014-10-02 (×2): 0.5 mg via INTRAVENOUS

## 2014-10-02 MED ORDER — HYDROMORPHONE HCL 1 MG/ML IJ SOLN
INTRAMUSCULAR | Status: AC
  Filled 2014-10-02: qty 1

## 2014-10-02 MED ORDER — GLYCOPYRROLATE 0.2 MG/ML IJ SOLN
INTRAMUSCULAR | Status: DC | PRN
  Administered 2014-10-02: 0.4 mg via INTRAVENOUS

## 2014-10-02 MED ORDER — NEOSTIGMINE METHYLSULFATE 10 MG/10ML IV SOLN
INTRAVENOUS | Status: DC | PRN
  Administered 2014-10-02: 3 mg via INTRAVENOUS

## 2014-10-02 MED ORDER — MORPHINE SULFATE 2 MG/ML IJ SOLN
2.0000 mg | INTRAMUSCULAR | Status: DC | PRN
  Administered 2014-10-02: 2 mg via INTRAVENOUS
  Filled 2014-10-02: qty 1

## 2014-10-02 MED ORDER — ONDANSETRON HCL 4 MG/2ML IJ SOLN: 4.0000 mg | Freq: Four times a day (QID) | INTRAMUSCULAR | Status: DC | PRN

## 2014-10-02 MED ORDER — ENOXAPARIN SODIUM 40 MG/0.4ML ~~LOC~~ SOLN: 40.0000 mg | SUBCUTANEOUS | Status: DC

## 2014-10-02 MED ORDER — OXYCODONE-ACETAMINOPHEN 5-325 MG PO TABS: 1.0000 | ORAL_TABLET | ORAL | Status: DC | PRN

## 2014-10-02 MED ORDER — ACETAMINOPHEN 325 MG PO TABS: 650.0000 mg | ORAL_TABLET | ORAL | Status: DC | PRN

## 2014-10-02 MED ORDER — OXYCODONE-ACETAMINOPHEN 5-325 MG PO TABS
1.0000 | ORAL_TABLET | ORAL | Status: DC | PRN
  Administered 2014-10-02: 2 via ORAL
  Filled 2014-10-02: qty 2

## 2014-10-02 MED ORDER — PROMETHAZINE HCL 25 MG/ML IJ SOLN: 6.2500 mg | INTRAMUSCULAR | Status: DC | PRN

## 2014-10-02 NOTE — Progress Notes (Signed)
Discharge instructions gone over with patient. Home medications gone over. Prescription given. Diet, activity, and reasons to call the doctor gone over. Incisional care gone over. My chart discussed. Follow up appointment to be made. Bowel regimen discussed. Patient verbalized understanding of instructions.

## 2014-10-02 NOTE — Op Note (Signed)
10/01/2014 - 10/02/2014  12:21 AM  PATIENT:  Nicholas Horn  54 y.o. male  PRE-OPERATIVE DIAGNOSIS:  acute appendicitis  POST-OPERATIVE DIAGNOSIS:  acute appendicitis  PROCEDURE:  Procedure(s): APPENDECTOMY LAPAROSCOPIC  SURGEON:  Surgeon(s): Georganna Skeans, MD  ASSISTANTS: none   ANESTHESIA:   local and general  EBL:  Total I/O In: 800 [I.V.:800] Out: -   BLOOD ADMINISTERED:none  DRAINS: none   SPECIMEN:  Excision  DISPOSITION OF SPECIMEN:  PATHOLOGY  COUNTS:  YES  DICTATION: Dragon Dictation Lesley is brought for appendectomy. He was done from the preop holding area. Informed consent was obtained. He received intravenous antibiotics. He was brought to the operating room and general endotracheal anesthesia was administered by the anesthesia staff. Foley catheter was placed by nursing. His abdomen was prepped and draped in a sterile fashion. Time out procedure was performed. Impression umbilical region was infiltrated with local. Infraumbilical incision was made. 16 his tissues were dissected down revealing the anterior fascia. This was divided sharply along the midline. The peritoneal cavity was entered under direct vision. 0 Vicryl pursestring was placed on the fascial opening. Hassan trocar was inserted. Abdomen was insufflated with carbon dioxide in standard fashion. Under direct vision, a 12 mm left lower quadrant and 5 mm right mid abdomen port were placed. Local was used at each port site. Laparoscopic exploration revealed a very inflamed but not perforated appendix. The mesoappendix was divided with harmonic scalpel achieving excellent hemostasis. The base the appendix was divided with Endo GIA with vascular load. There was excellent closure of the staple line. Appendix was placed in Endo Catch bag and removed from the abdomen via the left lower quadrant port site. Abdomen was copiously irrigated. Irrigation returned clear. Staple line was intact. There was no bleeding.  Ports removed under direct vision. Pneumoperitoneum was released. Informed local fascia was closed by tying the pursestring. An additional 2 stitches of 0 Vicryl were placed to reinforce the closure. All 3 wounds were irrigated and the skin of each was closed with running 4-0 Vicryl subcuticular followed by liquid band. All counts were correct.  Patient tolerated procedure well without apparent complication was taken recovery in stable condition.   PATIENT DISPOSITION:  PACU - hemodynamically stable.   Delay start of Pharmacological VTE agent (>24hrs) due to surgical blood loss or risk of bleeding:  no  Georganna Skeans, MD, MPH, FACS Pager: [phone]  2/27/201612:21 AM

## 2014-10-02 NOTE — Discharge Instructions (Signed)
Laparoscopic Appendectomy Care After Refer to this sheet in the next few weeks. These instructions provide you with information on caring for yourself after your procedure. Your caregiver may also give you more specific instructions. Your treatment has been planned according to current medical practices, but problems sometimes occur. Call your caregiver if you have any problems or questions after your procedure. HOME CARE INSTRUCTIONS  Do not drive while taking narcotic pain medicines.  Use stool softener if you become constipated from your pain medicines.  Change your bandages (dressings) as directed.  Keep your wounds clean and dry. You may wash the wounds gently with soap and water. Gently pat the wounds dry with a clean towel.  Do not take baths, swim, or use hot tubs for 10 days, or as instructed by your caregiver.  Only take over-the-counter or prescription medicines for pain, discomfort, or fever as directed by your caregiver.  You may continue your normal diet as directed.  Do not lift more than 10 pounds (4.5 kg) or play contact sports for 3 weeks, or as directed.  Slowly increase your activity after surgery.  Take deep breaths to avoid getting a lung infection (pneumonia). SEEK MEDICAL CARE IF:  You have redness, swelling, or increasing pain in your wounds.  You have pus coming from your wounds.  You have drainage from a wound that lasts longer than 1 day.  You notice a bad smell coming from the wounds or dressing.  Your wound edges break open after stitches (sutures) have been removed.  You notice increasing pain in the shoulders (shoulder strap areas) or near your shoulder blades.  You develop dizzy episodes or fainting while standing.  You develop shortness of breath.  You develop persistent nausea or vomiting.  You cannot control your bowel functions or lose your appetite.  You develop diarrhea. SEEK IMMEDIATE MEDICAL CARE IF:   You have a fever.  You  develop a rash.  You have difficulty breathing or sharp pains in your chest.  You develop any reaction or side effects to medicines given. MAKE SURE YOU:  Understand these instructions.  Will watch your condition.  Will get help right away if you are not doing well or get worse. Document Released: 07/23/2005 Document Revised: 10/15/2011 Document Reviewed: 01/30/2011 [facility] Patient Information 2015 [place], [place]. This information is not intended to replace advice given to you by your health care provider. Make sure you discuss any questions you have with your health care provider.

## 2014-10-02 NOTE — Discharge Summary (Signed)
Physician Discharge Summary  Patient ID: Nicholas Horn MRN: [id number] DOB/AGE: 1961/06/10 54 y.o.  Admit date: 10/01/2014 Discharge date: 10/02/2014  Admission Diagnoses: acute appendicitis  Discharge Diagnoses:  Active Problems:   S/P laparoscopic appendectomy   Discharged Condition: good  Hospital Course: Pt was admitted for acute appendicitis.  He underwent lap appy.  He did well.  He was transtioned to a Reg diet which he did well with.  He was ambulating well and had good pain control and deemed stable for DC and [place] home.  Consults: None  Significant Diagnostic Studies: none  Treatments: surgery: as above  Discharge Exam: Blood pressure 126/70, pulse 59, temperature 97.9 F (36.6 C), temperature source Oral, resp. rate 16, SpO2 95 %. General appearance: alert and cooperative GI: soft, nd, approp ttp, wound c/d/i  Disposition: 01-Home or Self Care  Discharge Instructions    Diet - low sodium heart healthy    Complete by:  As directed      Increase activity slowly    Complete by:  As directed             Medication List    TAKE these medications        oxyCODONE-acetaminophen 5-325 MG per tablet  Commonly known as:  PERCOCET  Take 1 tablet by mouth every 8 (eight) hours as needed.     oxyCODONE-acetaminophen 5-325 MG per tablet  Commonly known as:  ROXICET  Take 1 tablet by mouth every 4 (four) hours as needed for severe pain.     oxyCODONE-acetaminophen 5-325 MG per tablet  Commonly known as:  PERCOCET/ROXICET  Take 1-2 tablets by mouth every 4 (four) hours as needed for moderate pain.     pravastatin 40 MG tablet  Commonly known as:  PRAVACHOL  Take 1 tablet (40 mg total) by mouth daily.           Follow-up Information    Follow up with Zenovia Jarred, MD.   Specialty:  General Surgery   Why:  For wound re-check   Contact information:   [place]  [id number] [phone]       Signed: Rosario Jacks.,  Anne Hahn 10/02/2014, 8:18 AM

## 2014-10-02 NOTE — Transfer of Care (Signed)
Immediate Anesthesia Transfer of Care Note  Patient: Nicholas Horn  Procedure(s) Performed: Procedure(s): APPENDECTOMY LAPAROSCOPIC (N/A)  Patient Location: PACU  Anesthesia Type:General  Level of Consciousness: awake, alert  and oriented  Airway & Oxygen Therapy: Patient Spontanous Breathing and Patient connected to nasal cannula oxygen  Post-op Assessment: Report given to RN and Post -op Vital signs reviewed and stable  Post vital signs: Reviewed and stable  Last Vitals:  Filed Vitals:   10/01/14 2230  BP: 135/69  Pulse: 75  Temp:   Resp:     Complications: No apparent anesthesia complications

## 2014-10-02 NOTE — Anesthesia Postprocedure Evaluation (Signed)
  Anesthesia Post-op Note  Patient: Nicholas Horn  Procedure(s) Performed: Procedure(s) (LRB): APPENDECTOMY LAPAROSCOPIC (N/A)  Patient Location: PACU  Anesthesia Type: General  Level of Consciousness: awake and alert   Airway and Oxygen Therapy: Patient Spontanous Breathing  Post-op Pain: mild  Post-op Assessment: Post-op Vital signs reviewed, Patient's Cardiovascular Status Stable, Respiratory Function Stable, Patent Airway and No signs of Nausea or vomiting  Last Vitals:  Filed Vitals:   10/02/14 0024  BP: 123/70  Pulse: 68  Temp: 36.5 C  Resp: 11    Post-op Vital Signs: stable   Complications: No apparent anesthesia complications

## 2014-10-02 NOTE — Progress Notes (Signed)
Utilization Review completed.  

## 2014-10-04 ENCOUNTER — Encounter (HOSPITAL_COMMUNITY): Payer: Self-pay | Admitting: General Surgery

## 2014-11-16 ENCOUNTER — Emergency Department (HOSPITAL_COMMUNITY)
Admission: EM | Admit: 2014-11-16 | Discharge: 2014-11-16 | Disposition: A | Discharge status: Home / Self Care | Payer: Medicaid Other | Source: Home / Self Care | Attending: Family Medicine | Admitting: Family Medicine

## 2014-11-16 ENCOUNTER — Encounter (HOSPITAL_COMMUNITY): Payer: Self-pay | Admitting: Emergency Medicine

## 2014-11-16 DIAGNOSIS — M5432 Sciatica, left side: Secondary | ICD-10-CM | POA: Diagnosis not present

## 2014-11-16 DIAGNOSIS — R42 Dizziness and giddiness: Secondary | ICD-10-CM

## 2014-11-16 MED ORDER — OXYCODONE-ACETAMINOPHEN 5-325 MG PO TABS: 1.0000 | ORAL_TABLET | Freq: Four times a day (QID) | ORAL | Status: DC | PRN

## 2014-11-16 MED ORDER — MELOXICAM 15 MG PO TABS: 15.0000 mg | ORAL_TABLET | Freq: Every day | ORAL | Status: DC

## 2014-11-16 MED ORDER — MECLIZINE HCL 25 MG PO TABS: 25.0000 mg | ORAL_TABLET | Freq: Four times a day (QID) | ORAL | Status: DC | PRN

## 2014-11-16 NOTE — Discharge Instructions (Signed)
Back Exercises Back exercises help treat and prevent back injuries. The goal of back exercises is to increase the strength of your abdominal and back muscles and the flexibility of your back. These exercises should be started when you no longer have back pain. Back exercises include:  Pelvic Tilt. Lie on your back with your knees bent. Tilt your pelvis until the lower part of your back is against the floor. Hold this position 5 to 10 sec and repeat 5 to 10 times.  Knee to Chest. Pull first 1 knee up against your chest and hold for 20 to 30 seconds, repeat this with the other knee, and then both knees. This may be done with the other leg straight or bent, whichever feels better.  Sit-Ups or Curl-Ups. Bend your knees 90 degrees. Start with tilting your pelvis, and do a partial, slow sit-up, lifting your trunk only 30 to 45 degrees off the floor. Take at least 2 to 3 seconds for each sit-up. Do not do sit-ups with your knees out straight. If partial sit-ups are difficult, simply do the above but with only tightening your abdominal muscles and holding it as directed.  Hip-Lift. Lie on your back with your knees flexed 90 degrees. Push down with your feet and shoulders as you raise your hips a couple inches off the floor; hold for 10 seconds, repeat 5 to 10 times.  Back arches. Lie on your stomach, propping yourself up on bent elbows. Slowly press on your hands, causing an arch in your low back. Repeat 3 to 5 times. Any initial stiffness and discomfort should lessen with repetition over time.  Shoulder-Lifts. Lie face down with arms beside your body. Keep hips and torso pressed to floor as you slowly lift your head and shoulders off the floor. Do not overdo your exercises, especially in the beginning. Exercises may cause you some mild back discomfort which lasts for a few minutes; however, if the pain is more severe, or lasts for more than 15 minutes, do not continue exercises until you see your caregiver.  Improvement with exercise therapy for back problems is slow.  See your caregivers for assistance with developing a proper back exercise program. Document Released: 08/30/2004 Document Revised: 10/15/2011 Document Reviewed: 05/24/2011 [facility] Patient Information 2015 [place], [place]. This information is not intended to replace advice given to you by your health care provider. Make sure you discuss any questions you have with your health care provider.  Back Pain, Adult Back pain is very common. The pain often gets better over time. The cause of back pain is usually not dangerous. Most people can learn to manage their back pain on their own.  HOME CARE   Stay active. Start with short walks on flat ground if you can. Try to walk farther each day.  Do not sit, drive, or stand in one place for more than 30 minutes. Do not stay in bed.  Do not avoid exercise or work. Activity can help your back heal faster.  Be careful when you bend or lift an object. Bend at your knees, keep the object close to you, and do not twist.  Sleep on a firm mattress. Lie on your side, and bend your knees. If you lie on your back, put a pillow under your knees.  Only take medicines as told by your doctor.  Put ice on the injured area.  Put ice in a plastic bag.  Place a towel between your skin and the bag.  Leave the  ice on for 15-20 minutes, 03-04 times a day for the first 2 to 3 days. After that, you can switch between ice and heat packs.  Ask your doctor about back exercises or massage.  Avoid feeling anxious or stressed. Find good ways to deal with stress, such as exercise. GET HELP RIGHT AWAY IF:   Your pain does not go away with rest or medicine.  Your pain does not go away in 1 week.  You have new problems.  You do not feel well.  The pain spreads into your legs.  You cannot control when you poop (bowel movement) or pee (urinate).  Your arms or legs feel weak or lose feeling  (numbness).  You feel sick to your stomach (nauseous) or throw up (vomit).  You have belly (abdominal) pain.  You feel like you may pass out (faint). MAKE SURE YOU:   Understand these instructions.  Will watch your condition.  Will get help right away if you are not doing well or get worse. Document Released: 01/09/2008 Document Revised: 10/15/2011 Document Reviewed: 11/24/2013 [facility] Patient Information 2015 [place], [place]. This information is not intended to replace advice given to you by your health care provider. Make sure you discuss any questions you have with your health care provider.  Benign Positional Vertigo Vertigo means you feel like you or your surroundings are moving when they are not. Benign positional vertigo is the most common form of vertigo. Benign means that the cause of your condition is not serious. Benign positional vertigo is more common in older adults. CAUSES  Benign positional vertigo is the result of an upset in the labyrinth system. This is an area in the middle ear that helps control your balance. This may be caused by a viral infection, head injury, or repetitive motion. However, often no specific cause is found. SYMPTOMS  Symptoms of benign positional vertigo occur when you move your head or eyes in different directions. Some of the symptoms may include:  Loss of balance and falls.  Vomiting.  Blurred vision.  Dizziness.  Nausea.  Involuntary eye movements (nystagmus). DIAGNOSIS  Benign positional vertigo is usually diagnosed by physical exam. If the specific cause of your benign positional vertigo is unknown, your caregiver may perform imaging tests, such as magnetic resonance imaging (MRI) or computed tomography (CT). TREATMENT  Your caregiver may recommend movements or procedures to correct the benign positional vertigo. Medicines such as meclizine, benzodiazepines, and medicines for nausea may be used to treat your symptoms. In rare cases,  if your symptoms are caused by certain conditions that affect the inner ear, you may need surgery. HOME CARE INSTRUCTIONS   Follow your caregiver's instructions.  Move slowly. Do not make sudden body or head movements.  Avoid driving.  Avoid operating heavy machinery.  Avoid performing any tasks that would be dangerous to you or others during a vertigo episode.  Drink enough fluids to keep your urine clear or pale yellow. SEEK IMMEDIATE MEDICAL CARE IF:   You develop problems with walking, weakness, numbness, or using your arms, hands, or legs.  You have difficulty speaking.  You develop severe headaches.  Your nausea or vomiting continues or gets worse.  You develop visual changes.  Your family or friends notice any behavioral changes.  Your condition gets worse.  You have a fever.  You develop a stiff neck or sensitivity to light. MAKE SURE YOU:   Understand these instructions.  Will watch your condition.  Will get help right away  if you are not doing well or get worse. Document Released: 04/30/2006 Document Revised: 10/15/2011 Document Reviewed: 04/12/2011 [facility] Patient Information 2015 [place], [place]. This information is not intended to replace advice given to you by your health care provider. Make sure you discuss any questions you have with your health care provider.  Chronic Back Pain  When back pain lasts longer than 3 months, it is called chronic back pain.People with chronic back pain often go through certain periods that are more intense (flare-ups).  CAUSES Chronic back pain can be caused by wear and tear (degeneration) on different structures in your back. These structures include:  The bones of your spine (vertebrae) and the joints surrounding your spinal cord and nerve roots (facets).  The strong, fibrous tissues that connect your vertebrae (ligaments). Degeneration of these structures may result in pressure on your nerves. This can lead to  constant pain. HOME CARE INSTRUCTIONS  Avoid bending, heavy lifting, prolonged sitting, and activities which make the problem worse.  Take brief periods of rest throughout the day to reduce your pain. Lying down or standing usually is better than sitting while you are resting.  Take over-the-counter or prescription medicines only as directed by your caregiver. SEEK IMMEDIATE MEDICAL CARE IF:   You have weakness or numbness in one of your legs or feet.  You have trouble controlling your bladder or bowels.  You have nausea, vomiting, abdominal pain, shortness of breath, or fainting. Document Released: 08/30/2004 Document Revised: 10/15/2011 Document Reviewed: 07/07/2011 [facility] Patient Information 2015 [place], [place]. This information is not intended to replace advice given to you by your health care provider. Make sure you discuss any questions you have with your health care provider.  Sciatica Sciatica is pain, weakness, numbness, or tingling along the path of the sciatic nerve. The nerve starts in the lower back and runs down the back of each leg. The nerve controls the muscles in the lower leg and in the back of the knee, while also providing sensation to the back of the thigh, lower leg, and the sole of your foot. Sciatica is a symptom of another medical condition. For instance, nerve damage or certain conditions, such as a herniated disk or bone spur on the spine, pinch or put pressure on the sciatic nerve. This causes the pain, weakness, or other sensations normally associated with sciatica. Generally, sciatica only affects one side of the body. CAUSES   Herniated or slipped disc.  Degenerative disk disease.  A pain disorder involving the narrow muscle in the buttocks (piriformis syndrome).  Pelvic injury or fracture.  Pregnancy.  Tumor (rare). SYMPTOMS  Symptoms can vary from mild to very severe. The symptoms usually travel from the low back to the buttocks and down the back  of the leg. Symptoms can include:  Mild tingling or dull aches in the lower back, leg, or hip.  Numbness in the back of the calf or sole of the foot.  Burning sensations in the lower back, leg, or hip.  Sharp pains in the lower back, leg, or hip.  Leg weakness.  Severe back pain inhibiting movement. These symptoms may get worse with coughing, sneezing, laughing, or prolonged sitting or standing. Also, being overweight may worsen symptoms. DIAGNOSIS  Your caregiver will perform a physical exam to look for common symptoms of sciatica. He or she may ask you to do certain movements or activities that would trigger sciatic nerve pain. Other tests may be performed to find the cause of the sciatica.  These may include:  Blood tests.  X-rays.  Imaging tests, such as an MRI or CT scan. TREATMENT  Treatment is directed at the cause of the sciatic pain. Sometimes, treatment is not necessary and the pain and discomfort goes away on its own. If treatment is needed, your caregiver may suggest:  Over-the-counter medicines to relieve pain.  Prescription medicines, such as anti-inflammatory medicine, muscle relaxants, or narcotics.  Applying heat or ice to the painful area.  Steroid injections to lessen pain, irritation, and inflammation around the nerve.  Reducing activity during periods of pain.  Exercising and stretching to strengthen your abdomen and improve flexibility of your spine. Your caregiver may suggest losing weight if the extra weight makes the back pain worse.  Physical therapy.  Surgery to eliminate what is pressing or pinching the nerve, such as a bone spur or part of a herniated disk. HOME CARE INSTRUCTIONS   Only take over-the-counter or prescription medicines for pain or discomfort as directed by your caregiver.  Apply ice to the affected area for 20 minutes, 3-4 times a day for the first 48-72 hours. Then try heat in the same way.  Exercise, stretch, or perform your  usual activities if these do not aggravate your pain.  Attend physical therapy sessions as directed by your caregiver.  Keep all follow-up appointments as directed by your caregiver.  Do not wear high heels or shoes that do not provide proper support.  Check your mattress to see if it is too soft. A firm mattress may lessen your pain and discomfort. SEEK IMMEDIATE MEDICAL CARE IF:   You lose control of your bowel or bladder (incontinence).  You have increasing weakness in the lower back, pelvis, buttocks, or legs.  You have redness or swelling of your back.  You have a burning sensation when you urinate.  You have pain that gets worse when you lie down or awakens you at night.  Your pain is worse than you have experienced in the past.  Your pain is lasting longer than 4 weeks.  You are suddenly losing weight without reason. MAKE SURE YOU:  Understand these instructions.  Will watch your condition.  Will get help right away if you are not doing well or get worse. Document Released: 07/17/2001 Document Revised: 01/22/2012 Document Reviewed: 12/02/2011 [facility] Patient Information 2015 [place], [place]. This information is not intended to replace advice given to you by your health care provider. Make sure you discuss any questions you have with your health care provider.

## 2014-11-16 NOTE — ED Provider Notes (Signed)
CSN: [id number]     Arrival date & time 11/16/14  1119 History   First MD Initiated Contact with Patient 11/16/14 1303     Chief Complaint  Patient presents with  . Back Pain  . Dizziness   (Consider location/radiation/quality/duration/timing/severity/associated sxs/prior Treatment) HPI     54 year old male presents complaining of back pain and vertigo. He has a history of a chronic benign positional vertigo. He takes meclizine for this but he has run out of it. He was not aware that he can buy it over-the-counter. He describes a sensation of the rooms pain around him only when he loses head certain ways and he gets better with sitting still. Has no nausea or vomiting. No difficulty with ambulation. No facial or extremity numbness or weakness. Also he complains of back pain. He had a microdiscectomy 1-1/2 years ago and has been okay until the last 3 days the pain has come back. He has pain in the lower midline in the area where he had the surgery that radiates down his left leg to the knee. Denies any genital numbness or weakness or loss of bowel or bladder control. No leg numbness or weakness, only pain  Past Medical History  Diagnosis Date  . Vision impairment     wears reading glasses  . EKG abnormality 2007    stress test and cardiac evaluation [facility] cardiology  . PONV (postoperative nausea and vomiting)   . Shortness of breath     with exertion  . Sleep apnea 07   Past Surgical History  Procedure Laterality Date  . Cardiac catheterization  2007    due to equivocal stress test.  normal cath per pt report  . Wisdom tooth extraction      age 60yo  . Back surgery  2014    Micro disectomy l3-4  . Knee arthroscopy Left 2014  . Colonoscopy    . Excision haglund's deformity with achilles tendon repair Right 07/15/2013    Procedure: Resection right Haglund Deformity;  Surgeon: Newt Minion, MD;  Location: [facility];  Service: Orthopedics;  Laterality: Right;  Resection right Haglund  Deformity  . Laparoscopic appendectomy N/A 10/01/2014    Procedure: APPENDECTOMY LAPAROSCOPIC;  Surgeon: Georganna Skeans, MD;  [facility] OR;  Service: General;  Laterality: N/A;   Family History  Problem Relation Age of Onset  . Heart disease Mother 73    died of heart disease, age 63yo  . Obesity Mother   . Hypertension Mother   . Hyperlipidemia Mother   . Cancer Father     died of stomach and intestinal cancer  . Heart disease Brother     CAD, stent  . Other Brother     substance abuse  . Diabetes Neg Hx   . Stroke Neg Hx   . Colon cancer Neg Hx    History  Substance Use Topics  . Smoking status: Former Smoker -- 7 years    Types: Cigarettes    Start date: 09/06/1985  . Smokeless tobacco: Not on file     Comment: quit in 1987  . Alcohol Use: No    Review of Systems  Musculoskeletal: Positive for back pain (radiating down left leg).  Neurological: Positive for dizziness.  All other systems reviewed and are negative.   Allergies  Honey  Home Medications   Prior to Admission medications   Medication Sig Start Date End Date Taking? Authorizing Provider  meloxicam (MOBIC) 15 MG tablet Take 15 mg by mouth  daily.   Yes Historical Provider, MD  meclizine (ANTIVERT) 25 MG tablet Take 1 tablet (25 mg total) by mouth every 6 (six) hours as needed for dizziness. 11/16/14   Liam Graham, PA-C  meloxicam (MOBIC) 15 MG tablet Take 1 tablet (15 mg total) by mouth daily. 11/16/14   Liam Graham, PA-C  oxyCODONE-acetaminophen (PERCOCET) 5-325 MG per tablet Take 1 tablet by mouth every 8 (eight) hours as needed. Patient not taking: Reported on 10/01/2014 07/01/13   Doe-Hyun R Shawna Orleans, DO  oxyCODONE-acetaminophen (PERCOCET/ROXICET) 5-325 MG per tablet Take 1-2 tablets by mouth every 4 (four) hours as needed for moderate pain. 10/02/14   Ralene Ok, MD  oxyCODONE-acetaminophen (PERCOCET/ROXICET) 5-325 MG per tablet Take 1 tablet by mouth every 6 (six) hours as needed for severe  pain. 11/16/14   Liam Graham, PA-C  oxyCODONE-acetaminophen (ROXICET) 5-325 MG per tablet Take 1 tablet by mouth every 4 (four) hours as needed for severe pain. 07/15/13   Newt Minion, MD  pravastatin (PRAVACHOL) 40 MG tablet Take 1 tablet (40 mg total) by mouth daily. Patient not taking: Reported on 10/01/2014 07/01/13   Doe-Hyun R Shawna Orleans, DO   BP 155/89 mmHg  Pulse 57  Temp(Src) 97.6 F (36.4 C) (Oral)  Resp 18  SpO2 98% Physical Exam  Constitutional: He is oriented to person, place, and time. He appears well-developed and well-nourished. No distress.  HENT:  Head: Normocephalic.  Pulmonary/Chest: Effort normal. No respiratory distress.  Musculoskeletal:       Lumbar back: He exhibits tenderness (moderate), bony tenderness and pain. He exhibits normal range of motion, no swelling, no edema, no deformity and no spasm.  Midline surgical scar in the lumbar spine from previous surgery  Neurological: He is alert and oriented to person, place, and time. He has normal strength. No cranial nerve deficit or sensory deficit. He exhibits normal muscle tone. Coordination and gait normal. GCS eye subscore is 4. GCS verbal subscore is 5. GCS motor subscore is 6.  Reflex Scores:      Patellar reflexes are 2+ on the right side and 1+ on the left side. Reflexes are chronically diminished in the left leg since his back surgery  Skin: Skin is warm and dry. No rash noted. He is not diaphoretic.  Psychiatric: He has a normal mood and affect. Judgment normal.  Nursing note and vitals reviewed.   ED Course  Procedures (including critical care time) Labs Review Labs Reviewed - No data to display  Imaging Review No results found.   MDM   1. Back pain with left-sided sciatica   2. Vertigo    Benign positional vertigo, meclizine refilled. For the back pain, he regularly takes Motrin but he is always out of it so full that. I will give him a few tablets of oxicodone for more severe pain and he will  follow-up with orthopedics  Meds ordered this encounter  Medications  . meloxicam (MOBIC) 15 MG tablet    Sig: Take 15 mg by mouth daily.  . meclizine (ANTIVERT) 25 MG tablet    Sig: Take 1 tablet (25 mg total) by mouth every 6 (six) hours as needed for dizziness.    Dispense:  30 tablet    Refill:  0  . meloxicam (MOBIC) 15 MG tablet    Sig: Take 1 tablet (15 mg total) by mouth daily.    Dispense:  30 tablet    Refill:  0  . oxyCODONE-acetaminophen (PERCOCET/ROXICET) 5-325 MG per tablet  Sig: Take 1 tablet by mouth every 6 (six) hours as needed for severe pain.    Dispense:  10 tablet    Refill:  0       Liam Graham, PA-C 11/16/14 1354

## 2014-11-16 NOTE — ED Notes (Signed)
Pt is having new onset pain from a back surgery he had 1.5 years ago.  Pt would like to see an orthopedist, but needs a referral to do so.  He is also complaining of vertigo and was hoping to get medication for that.

## 2014-11-22 ENCOUNTER — Encounter (HOSPITAL_COMMUNITY): Payer: Self-pay | Admitting: Emergency Medicine

## 2014-11-22 DIAGNOSIS — R531 Weakness: Secondary | ICD-10-CM | POA: Insufficient documentation

## 2014-11-22 DIAGNOSIS — Z9889 Other specified postprocedural states: Secondary | ICD-10-CM | POA: Diagnosis not present

## 2014-11-22 DIAGNOSIS — G8929 Other chronic pain: Secondary | ICD-10-CM | POA: Insufficient documentation

## 2014-11-22 DIAGNOSIS — Z87891 Personal history of nicotine dependence: Secondary | ICD-10-CM | POA: Diagnosis not present

## 2014-11-22 DIAGNOSIS — Z9049 Acquired absence of other specified parts of digestive tract: Secondary | ICD-10-CM | POA: Insufficient documentation

## 2014-11-22 DIAGNOSIS — Z79899 Other long term (current) drug therapy: Secondary | ICD-10-CM | POA: Insufficient documentation

## 2014-11-22 DIAGNOSIS — R1013 Epigastric pain: Secondary | ICD-10-CM | POA: Insufficient documentation

## 2014-11-22 DIAGNOSIS — K921 Melena: Secondary | ICD-10-CM | POA: Diagnosis present

## 2014-11-22 DIAGNOSIS — R509 Fever, unspecified: Secondary | ICD-10-CM | POA: Diagnosis not present

## 2014-11-22 LAB — COMPREHENSIVE METABOLIC PANEL
ALT: 42 U/L (ref 0–53)
AST: 34 U/L (ref 0–37)
Albumin: 4.1 g/dL (ref 3.5–5.2)
Alkaline Phosphatase: 74 U/L (ref 39–117)
Anion gap: 9 (ref 5–15)
BUN: 15 mg/dL (ref 6–23)
CO2: 27 mmol/L (ref 19–32)
CREATININE: 1.21 mg/dL (ref 0.50–1.35)
Calcium: 9.2 mg/dL (ref 8.4–10.5)
Chloride: 103 mmol/L (ref 96–112)
GFR calc Af Amer: 78 mL/min — ABNORMAL LOW (ref >90)
GFR, EST NON AFRICAN AMERICAN: 67 mL/min — AB (ref >90)
Glucose, Bld: 102 mg/dL — ABNORMAL HIGH (ref 70–99)
Potassium: 3.3 mmol/L — ABNORMAL LOW (ref 3.5–5.1)
Sodium: 139 mmol/L (ref 135–145)
TOTAL PROTEIN: 7.5 g/dL (ref 6.0–8.3)
Total Bilirubin: 0.7 mg/dL (ref 0.3–1.2)

## 2014-11-22 LAB — CBC WITH DIFFERENTIAL/PLATELET
Basophils Absolute: 0.1 10*3/uL (ref 0.0–0.1)
Basophils Relative: 1 % (ref 0–1)
EOS ABS: 0.3 10*3/uL (ref 0.0–0.7)
Eosinophils Relative: 3 % (ref 0–5)
HCT: 44.3 % (ref 39.0–52.0)
Hemoglobin: 15.7 g/dL (ref 13.0–17.0)
LYMPHS ABS: 2.8 10*3/uL (ref 0.7–4.0)
Lymphocytes Relative: 31 % (ref 12–46)
MCH: 31.3 pg (ref 26.0–34.0)
MCHC: 35.4 g/dL (ref 30.0–36.0)
MCV: 88.4 fL (ref 78.0–100.0)
Monocytes Absolute: 0.7 10*3/uL (ref 0.1–1.0)
Monocytes Relative: 8 % (ref 3–12)
Neutro Abs: 5.1 10*3/uL (ref 1.7–7.7)
Neutrophils Relative %: 57 % (ref 43–77)
PLATELETS: 210 10*3/uL (ref 150–400)
RBC: 5.01 MIL/uL (ref 4.22–5.81)
RDW: 12.5 % (ref 11.5–15.5)
WBC: 8.8 10*3/uL (ref 4.0–10.5)

## 2014-11-22 LAB — URINALYSIS, ROUTINE W REFLEX MICROSCOPIC
Bilirubin Urine: NEGATIVE
GLUCOSE, UA: NEGATIVE mg/dL
Ketones, ur: NEGATIVE mg/dL
Leukocytes, UA: NEGATIVE
NITRITE: NEGATIVE
PH: 5.5 (ref 5.0–8.0)
Protein, ur: NEGATIVE mg/dL
Specific Gravity, Urine: 1.013 (ref 1.005–1.030)
Urobilinogen, UA: 0.2 mg/dL (ref 0.0–1.0)

## 2014-11-22 LAB — URINE MICROSCOPIC-ADD ON

## 2014-11-22 LAB — LIPASE, BLOOD: Lipase: 32 U/L (ref 11–59)

## 2014-11-22 NOTE — ED Notes (Signed)
Pt. reports low abdominal pain onset last week and black stools today , denies emesis or diarrhea. Pt. stated prescribed with oral steroids for his chronic back pain .

## 2014-11-23 ENCOUNTER — Emergency Department (HOSPITAL_COMMUNITY)
Admission: EM | Admit: 2014-11-23 | Discharge: 2014-11-23 | Disposition: A | Discharge status: Home / Self Care | Payer: Medicaid Other | Attending: Emergency Medicine | Admitting: Emergency Medicine

## 2014-11-23 DIAGNOSIS — R1013 Epigastric pain: Secondary | ICD-10-CM

## 2014-11-23 HISTORY — DX: Other chronic pain: G89.29

## 2014-11-23 HISTORY — DX: Dorsalgia, unspecified: M54.9

## 2014-11-23 LAB — CBC
HCT: 43.2 % (ref 39.0–52.0)
Hemoglobin: 15.4 g/dL (ref 13.0–17.0)
MCH: 31.6 pg (ref 26.0–34.0)
MCHC: 35.6 g/dL (ref 30.0–36.0)
MCV: 88.7 fL (ref 78.0–100.0)
PLATELETS: 167 10*3/uL (ref 150–400)
RBC: 4.87 MIL/uL (ref 4.22–5.81)
RDW: 12.5 % (ref 11.5–15.5)
WBC: 9.8 10*3/uL (ref 4.0–10.5)

## 2014-11-23 LAB — POC OCCULT BLOOD, ED: Fecal Occult Bld: NEGATIVE

## 2014-11-23 MED ORDER — ONDANSETRON HCL 4 MG/2ML IJ SOLN
4.0000 mg | Freq: Once | INTRAMUSCULAR | Status: AC
  Administered 2014-11-23: 4 mg via INTRAVENOUS
  Filled 2014-11-23: qty 2

## 2014-11-23 MED ORDER — PANTOPRAZOLE SODIUM 40 MG IV SOLR
40.0000 mg | Freq: Once | INTRAVENOUS | Status: AC
  Administered 2014-11-23: 40 mg via INTRAVENOUS
  Filled 2014-11-23: qty 40

## 2014-11-23 MED ORDER — OMEPRAZOLE 20 MG PO CPDR: 20.0000 mg | DELAYED_RELEASE_CAPSULE | Freq: Every day | ORAL | Status: DC

## 2014-11-23 MED ORDER — MORPHINE SULFATE 2 MG/ML IJ SOLN
2.0000 mg | Freq: Once | INTRAMUSCULAR | Status: AC
  Administered 2014-11-23: 2 mg via INTRAVENOUS
  Filled 2014-11-23: qty 1

## 2014-11-23 NOTE — ED Provider Notes (Signed)
CSN: [id number]     Arrival date & time 11/22/14  1941 History  This chart was scribed for Ripley Fraise, MD by Rayfield Citizen, ED Scribe. This patient was seen in room D31C/D31C and the patient's care was started at 12:54 AM.    Chief Complaint  Patient presents with  . Melena  . Abdominal Pain   Patient is a 54 y.o. male presenting with abdominal pain. The history is provided by the patient. No language interpreter was used.  Abdominal Pain Pain location:  Generalized Pain radiates to:  Does not radiate Pain severity:  Moderate Onset quality:  Gradual Duration:  4 days Timing:  Constant Progression:  Worsening Chronicity:  New Context: previous surgery   Relieved by:  None tried Worsened by:  Nothing tried Ineffective treatments:  None tried Associated symptoms: fever and melena   Associated symptoms: no chest pain, no dysuria, no hematuria and no shortness of breath      HPI Comments: Nicholas Horn is a 54 y.o. male who presents to the Emergency Department complaining of 4 days of gradually worsening abdominal pain with two episodes of dark, tarry stools beginning today. Patient reports low-grade fever throughout the weekend, as well as weakness. He explains that he was given a steroid pack by his doctor for chronic back pain; took a dose 4 nights previous and felt they "did not agree with him," so he stopped taking them. He states he was taking his other medications, including meloxicam, as prescribed while taking the steroid. His pain began the following morning. He denies chest pain, SOB, dysuria, syncope. He denies recent Pepto-Bismol or ibuprofen intake. He denies prior experience with similar symptoms; he is not on anticoagulants at this time.   Appendectomy 5-6 weeks PTA.   Past Medical History  Diagnosis Date  . Vision impairment     wears reading glasses  . EKG abnormality 2007    stress test and cardiac evaluation [facility] cardiology  . PONV (postoperative nausea  and vomiting)   . Shortness of breath     with exertion  . Sleep apnea 07  . Back pain, chronic    Past Surgical History  Procedure Laterality Date  . Cardiac catheterization  2007    due to equivocal stress test.  normal cath per pt report  . Wisdom tooth extraction      age 26yo  . Back surgery  2014    Micro disectomy l3-4  . Knee arthroscopy Left 2014  . Colonoscopy    . Excision haglund's deformity with achilles tendon repair Right 07/15/2013    Procedure: Resection right Haglund Deformity;  Surgeon: Newt Minion, MD;  Location: [place];  Service: Orthopedics;  Laterality: Right;  Resection right Haglund Deformity  . Laparoscopic appendectomy N/A 10/01/2014    Procedure: APPENDECTOMY LAPAROSCOPIC;  Surgeon: Georganna Skeans, MD;  Location: [place];  Service: General;  Laterality: N/A;  . Appendectomy    . Knee surgery     Family History  Problem Relation Age of Onset  . Heart disease Mother 72    died of heart disease, age 39yo  . Obesity Mother   . Hypertension Mother   . Hyperlipidemia Mother   . Cancer Father     died of stomach and intestinal cancer  . Heart disease Brother     CAD, stent  . Other Brother     substance abuse  . Diabetes Neg Hx   . Stroke Neg Hx   . Colon  cancer Neg Hx    History  Substance Use Topics  . Smoking status: Former Smoker -- 7 years    Types: Cigarettes    Start date: 09/06/1985  . Smokeless tobacco: Not on file     Comment: quit in 1987  . Alcohol Use: No    Review of Systems  Constitutional: Positive for fever.  Respiratory: Negative for shortness of breath.   Cardiovascular: Negative for chest pain.  Gastrointestinal: Positive for abdominal pain and melena.  Genitourinary: Negative for dysuria and hematuria.  Neurological: Positive for weakness. Negative for syncope.  All other systems reviewed and are negative.     Allergies  Honey  Home Medications   Prior to Admission medications   Medication Sig Start Date End  Date Taking? Authorizing Provider  meclizine (ANTIVERT) 25 MG tablet Take 1 tablet (25 mg total) by mouth every 6 (six) hours as needed for dizziness. 11/16/14  Yes Liam Graham, PA-C  methocarbamol (ROBAXIN) 500 MG tablet Take 500 mg by mouth every 8 (eight) hours as needed for muscle spasms.   Yes Historical Provider, MD  oxyCODONE-acetaminophen (PERCOCET/ROXICET) 5-325 MG per tablet Take 1 tablet by mouth every 6 (six) hours as needed for severe pain. 11/16/14  Yes Liam Graham, PA-C  omeprazole (PRILOSEC) 20 MG capsule Take 1 capsule (20 mg total) by mouth daily. 11/23/14   Ripley Fraise, MD   BP 140/88 mmHg  Pulse 70  Temp(Src) 98 F (36.7 C) (Oral)  Resp 14  Ht 6\' 4"  (1.93 m)  Wt 289 lb (131.09 kg)  BMI 35.19 kg/m2  SpO2 99% Physical Exam  Nursing note and vitals reviewed.    CONSTITUTIONAL: Well developed/well nourished HEAD: Normocephalic/atraumatic EYES: EOMI/PERRL ENMT: Mucous membranes moist NECK: supple no meningeal signs SPINE/BACK:entire spine nontender CV: S1/S2 noted, no murmurs/rubs/gallops noted LUNGS: Lungs are clear to auscultation bilaterally, no apparent distress ABDOMEN: soft, mild epigastric tenderness, no rebound or guarding, bowel sounds noted throughout abdomen.  Well healed surgical sites noted RECTAL: stool dark green, no blood or melena; chaperone present GU:no cva tenderness NEURO: Pt is awake/alert/appropriate, moves all extremitiesx4.  No facial droop.   EXTREMITIES: pulses normal/equal, full ROM SKIN: warm, color normal PSYCH: no abnormalities of mood noted, alert and oriented to situation   ED Course  Procedures   DIAGNOSTIC STUDIES: Oxygen Saturation is 99% on RA, normal by my interpretation.    COORDINATION OF CARE: 1:01 AM Discussed treatment plan with pt at bedside and pt agreed to plan.   Pt improved No evidence of acute GI bleed He has only mild epigastric tenderness I doubt acute abdominal emergency Advised to stop  NSAIDs/prednisone  BP 138/76 mmHg  Pulse 66  Temp(Src) 98 F (36.7 C) (Oral)  Resp 17  Ht 6\' 4"  (1.93 m)  Wt 289 lb (131.09 kg)  BMI 35.19 kg/m2  SpO2 97%  Labs Review Labs Reviewed  COMPREHENSIVE METABOLIC PANEL - Abnormal; Notable for the following:    Potassium 3.3 (*)    Glucose, Bld 102 (*)    GFR calc non Af Amer 67 (*)    GFR calc Af Amer 78 (*)    All other components within normal limits  URINALYSIS, ROUTINE W REFLEX MICROSCOPIC - Abnormal; Notable for the following:    Hgb urine dipstick TRACE (*)    All other components within normal limits  CBC WITH DIFFERENTIAL/PLATELET  LIPASE, BLOOD  URINE MICROSCOPIC-ADD ON  CBC  POC OCCULT BLOOD, ED    Medications  pantoprazole (  PROTONIX) injection 40 mg (40 mg Intravenous Given 11/23/14 0114)  ondansetron (ZOFRAN) injection 4 mg (4 mg Intravenous Given 11/23/14 0115)  morphine 2 MG/ML injection 2 mg (2 mg Intravenous Given 11/23/14 0115)  '  MDM   Final diagnoses:  Epigastric pain    Nursing notes including past medical history and social history reviewed and considered in documentation Labs/vital reviewed myself and considered during evaluation   I personally performed the services described in this documentation, which was scribed in my presence. The recorded information has been reviewed and is accurate.       Ripley Fraise, MD 11/23/14 [phone]

## 2014-11-23 NOTE — Discharge Instructions (Signed)
Stop all steroids and all anti-inflammatory medications  Abdominal (belly) pain can be caused by many things. any cases can be observed and treated at home after initial evaluation in the emergency department. Even though you are being discharged home, abdominal pain can be unpredictable. Therefore, you need a repeated exam if your pain does not resolve, returns, or worsens. Most patients with abdominal pain don't have to be admitted to the hospital or have surgery, but serious problems like appendicitis and gallbladder attacks can start out as nonspecific pain. Many abdominal conditions cannot be diagnosed in one visit, so follow-up evaluations are very important. SEEK IMMEDIATE MEDICAL ATTENTION IF: The pain does not go away or becomes severe, particularly over the next 8-12 hours.  A temperature above 100.7F develops.  Repeated vomiting occurs (multiple episodes).  The pain becomes localized to portions of the abdomen. The right side could possibly be appendicitis. In an adult, the left lower portion of the abdomen could be colitis or diverticulitis.  Blood is being passed in stools or vomit (bright red or black tarry stools).  Return also if you develop chest pain, difficulty breathing, dizziness or fainting, or become confused, poorly responsive, or inconsolable.

## 2014-11-23 NOTE — ED Notes (Signed)
The patient did have abdominal surgery five weeks ago for appendicitis.  He is complaining of some abdominal pain.  He also says he had two dark stools this morning.

## 2014-11-23 NOTE — ED Notes (Signed)
Patient is alert and orientedx4.  Patient was explained discharge instructions and they understood them with no questions.  The patient's ex-wife, Margarita Grizzle is taking him home.

## 2014-11-23 NOTE — ED Notes (Signed)
Three surgical scars mid abdomern in the healing process.  The patient did say one of them (the longest one in the middle) was infected and draining pus.  Its healing now.

## 2014-12-15 ENCOUNTER — Ambulatory Visit
Admission: RE | Admit: 2014-12-15 | Discharge: 2014-12-15 | Disposition: A | Discharge status: Home / Self Care | Payer: Medicaid Other | Source: Ambulatory Visit | Attending: Orthopedic Surgery | Admitting: Orthopedic Surgery

## 2014-12-15 ENCOUNTER — Other Ambulatory Visit: Payer: Self-pay | Admitting: Orthopedic Surgery

## 2014-12-15 DIAGNOSIS — M545 Low back pain: Secondary | ICD-10-CM

## 2014-12-18 ENCOUNTER — Other Ambulatory Visit: Payer: Medicaid Other

## 2014-12-28 ENCOUNTER — Other Ambulatory Visit: Payer: Medicaid Other

## 2015-08-07 DIAGNOSIS — N200 Calculus of kidney: Secondary | ICD-10-CM

## 2015-08-07 HISTORY — DX: Calculus of kidney: N20.0

## 2016-08-21 ENCOUNTER — Emergency Department (HOSPITAL_COMMUNITY): Payer: Medicaid Other

## 2016-08-21 ENCOUNTER — Emergency Department (HOSPITAL_COMMUNITY)
Admission: EM | Admit: 2016-08-21 | Discharge: 2016-08-21 | Disposition: A | Discharge status: Home / Self Care | Payer: Medicaid Other | Attending: Emergency Medicine | Admitting: Emergency Medicine

## 2016-08-21 ENCOUNTER — Encounter (HOSPITAL_COMMUNITY): Payer: Self-pay

## 2016-08-21 DIAGNOSIS — S62642A Nondisplaced fracture of proximal phalanx of right middle finger, initial encounter for closed fracture: Secondary | ICD-10-CM

## 2016-08-21 DIAGNOSIS — Y999 Unspecified external cause status: Secondary | ICD-10-CM | POA: Diagnosis not present

## 2016-08-21 DIAGNOSIS — Y9389 Activity, other specified: Secondary | ICD-10-CM | POA: Insufficient documentation

## 2016-08-21 DIAGNOSIS — Y929 Unspecified place or not applicable: Secondary | ICD-10-CM | POA: Insufficient documentation

## 2016-08-21 DIAGNOSIS — W1839XA Other fall on same level, initial encounter: Secondary | ICD-10-CM | POA: Insufficient documentation

## 2016-08-21 DIAGNOSIS — S63694A Other sprain of right ring finger, initial encounter: Secondary | ICD-10-CM

## 2016-08-21 DIAGNOSIS — Z87891 Personal history of nicotine dependence: Secondary | ICD-10-CM | POA: Insufficient documentation

## 2016-08-21 DIAGNOSIS — S6991XA Unspecified injury of right wrist, hand and finger(s), initial encounter: Secondary | ICD-10-CM | POA: Diagnosis present

## 2016-08-21 HISTORY — DX: Unspecified osteoarthritis, unspecified site: M19.90

## 2016-08-21 NOTE — ED Provider Notes (Signed)
[facility] [facility] DEPT Provider Note   CSN: [id number] Arrival date & time: 08/21/16  1702  By signing my name below, I, Nicholas Horn, attest that this documentation has been prepared under the direction and in the presence of non-physician practitioner, Debroah Baller, NP. Electronically Signed: Jeanell Horn, Scribe. 08/21/2016. 6:01 PM.  History   Chief Complaint Chief Complaint  Patient presents with  . Hand Pain   The history is provided by the patient. No language interpreter was used.  Hand Pain  This is a new problem. The current episode started 3 to 5 hours ago. The problem occurs constantly. The problem has not changed since onset.The symptoms are aggravated by exertion. The symptoms are relieved by medications. The treatment provided moderate relief.   HPI Comments: Nicholas Horn is a 56 y.o. male who presents to the Emergency Department complaining of constant moderate right hand pain that started today. He states he fell over one of his dogs, caught himself with his right hand, and hyperextended his 4th finger. He took his pain management medication PTA with some relief. His pain is exacerbated by movement. He reports associated right handed swelling. He denies any other complaints.     PCP: Drema Pry, DO  Past Medical History:  Diagnosis Date  . Arthritis   . Back pain, chronic   . EKG abnormality 2007   stress test and cardiac evaluation [facility] cardiology  . PONV (postoperative nausea and vomiting)   . Shortness of breath    with exertion  . Sleep apnea 07  . Vision impairment    wears reading glasses    Patient Active Problem List   Diagnosis Date Noted  . Closed nondisplaced fracture of proximal phalanx of right middle finger 08/24/2016  . Right hip pain 08/24/2016  . S/P laparoscopic appendectomy 10/02/2014  . Left knee pain 07/01/2013  . Morbid obesity ([place]) 01/23/2013  . Left lumbar radiculopathy 01/23/2013  . Heel pain 01/23/2013  . HYPERLIPIDEMIA  09/29/2008  . SLEEP APNEA, OBSTRUCTIVE 09/29/2008  . HEADACHE, SEVERE 09/29/2008    Past Surgical History:  Procedure Laterality Date  . APPENDECTOMY    . BACK SURGERY  2014   Micro disectomy l3-4  . CARDIAC CATHETERIZATION  2007   due to equivocal stress test.  normal cath per pt report  . COLONOSCOPY    . 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 Right 07/15/2013   Procedure: Resection right Haglund Deformity;  Surgeon: Newt Minion, MD;  Location: [place];  Service: Orthopedics;  Laterality: Right;  Resection right Haglund Deformity  . KNEE ARTHROSCOPY Left 2014  . KNEE SURGERY    . LAPAROSCOPIC APPENDECTOMY N/A 10/01/2014   Procedure: APPENDECTOMY LAPAROSCOPIC;  Surgeon: Georganna Skeans, MD;  Location: [facility];  Service: General;  Laterality: N/A;  . WISDOM TOOTH EXTRACTION     age 31yo       Home Medications    Prior to Admission medications   Medication Sig Start Date End Date Taking? Authorizing Provider  Diclofenac Sodium (PENNSAID) 2 % SOLN Place 1 Squirt onto the skin 2 (two) times daily. 08/24/16   Meredith Pel, MD  meclizine (ANTIVERT) 25 MG tablet Take 1 tablet (25 mg total) by mouth every 6 (six) hours as needed for dizziness. 11/16/14   Liam Graham, PA-C  meloxicam (MOBIC) 15 MG tablet Take 15 mg by mouth daily.    Historical Provider, MD  methocarbamol (ROBAXIN) 500 MG tablet Take 500 mg by mouth every 8 (eight)  hours as needed for muscle spasms.    Historical Provider, MD  omeprazole (PRILOSEC) 20 MG capsule Take 1 capsule (20 mg total) by mouth daily. 11/23/14   Ripley Fraise, MD  oxyCODONE-acetaminophen (PERCOCET/ROXICET) 5-325 MG per tablet Take 1 tablet by mouth every 6 (six) hours as needed for severe pain. 11/16/14   Liam Graham, PA-C    Family History Family History  Problem Relation Age of Onset  . Heart disease Mother 25    died of heart disease, age 64yo  . Obesity Mother   . Hypertension Mother   . Hyperlipidemia Mother     . Cancer Father     died of stomach and intestinal cancer  . Heart disease Brother     CAD, stent  . Other Brother     substance abuse  . Diabetes Neg Hx   . Stroke Neg Hx   . Colon cancer Neg Hx     Social History Social History  Substance Use Topics  . Smoking status: Former Smoker    Years: 7.00    Types: Cigarettes    Start date: 09/06/1985  . Smokeless tobacco: Never Used     Comment: quit in 1987  . Alcohol use No     Allergies   Honey   Review of Systems Review of Systems  Musculoskeletal: Positive for arthralgias, joint swelling and myalgias.     Physical Exam Updated Vital Signs BP (!) 149/106 (BP Location: Right Arm)   Pulse 70   Temp 97.9 F (36.6 C) (Oral)   Resp 18   Ht 6\' 4"  (1.93 m)   Wt 260 lb (117.9 kg)   SpO2 97%   BMI 31.65 kg/m   Physical Exam  Constitutional: He appears well-developed and well-nourished. No distress.  HENT:  Head: Normocephalic and atraumatic.  Eyes: Conjunctivae are normal.  Neck: Neck supple.  Cardiovascular: Normal rate.   Pulses:      Radial pulses are 2+ on the right side, and 2+ on the left side.  Pulmonary/Chest: Effort normal.  Abdominal: Soft.  Musculoskeletal: Normal range of motion.  TTP to the dorsum of the right hand at the base of the 3rd and 4th fingers.   Neurological: He is alert.  Skin: Skin is warm and dry.  Psychiatric: He has a normal mood and affect.  Nursing note and vitals reviewed.    ED Treatments / Results  DIAGNOSTIC STUDIES: Oxygen Saturation is 97% on RA, normal by my interpretation.    COORDINATION OF CARE: 6:05 PM- Pt advised of plan for treatment and pt agrees.  Labs (all labs ordered are listed, but only abnormal results are displayed) Labs Reviewed - No data to display Procedures Procedures (including critical care time)  Medications Ordered in ED Medications - No data to display   Initial Impression / Assessment and Plan / ED Course  I have reviewed the triage  vital signs and the nursing notes.  Pertinent imaging results that were available during my care of the patient were reviewed by me and considered in my medical decision making (see chart for details).    Patient X-Ray negative for obvious fracture or dislocation. Pain managed in ED. Pt advised to follow up with orthopedics if symptoms persist for possibility of missed fracture diagnosis. Patient given brace while in ED, conservative therapy recommended and discussed. Patient will be dc home & is agreeable with above plan.  Final Clinical Impressions(s) / ED Diagnoses   Final diagnoses:  Other sprain of  right ring finger  Closed nondisplaced fracture of proximal phalanx of right middle finger, initial encounter    New Prescriptions Discharge Medication List as of 08/21/2016  7:21 PM     I personally performed the services described in this documentation, which was scribed in my presence. The recorded information has been reviewed and is accurate.     [address] [place], NP 08/25/16 [place], MD 08/25/16 [phone]

## 2016-08-21 NOTE — ED Triage Notes (Signed)
Per Pt, Pt fell and landed on his right hand. Reports pain in the right hand. Pt is able to move. Cap refill intact, and pulses good.

## 2016-08-21 NOTE — Progress Notes (Signed)
Orthopedic Tech Progress Note Patient Details:  Nicholas Horn Mar 07, 1961 [id number]  Ortho Devices Type of Ortho Device: Ace wrap, Volar splint Ortho Device/Splint Location: RUE Ortho Device/Splint Interventions: Ordered, Application   Braulio Bosch 08/21/2016, 6:46 PM

## 2016-08-21 NOTE — ED Notes (Signed)
Patient currently in xray ?

## 2016-08-24 ENCOUNTER — Ambulatory Visit (INDEPENDENT_AMBULATORY_CARE_PROVIDER_SITE_OTHER): Payer: Self-pay

## 2016-08-24 ENCOUNTER — Ambulatory Visit (INDEPENDENT_AMBULATORY_CARE_PROVIDER_SITE_OTHER): Payer: Medicaid Other | Admitting: Orthopedic Surgery

## 2016-08-24 ENCOUNTER — Telehealth (INDEPENDENT_AMBULATORY_CARE_PROVIDER_SITE_OTHER): Payer: Self-pay | Admitting: Orthopedic Surgery

## 2016-08-24 ENCOUNTER — Encounter (INDEPENDENT_AMBULATORY_CARE_PROVIDER_SITE_OTHER): Payer: Self-pay | Admitting: Orthopedic Surgery

## 2016-08-24 DIAGNOSIS — S62642A Nondisplaced fracture of proximal phalanx of right middle finger, initial encounter for closed fracture: Secondary | ICD-10-CM | POA: Insufficient documentation

## 2016-08-24 DIAGNOSIS — M25551 Pain in right hip: Secondary | ICD-10-CM | POA: Diagnosis not present

## 2016-08-24 MED ORDER — DICLOFENAC SODIUM 2 % TD SOLN: 1.0000 | Freq: Two times a day (BID) | TRANSDERMAL | 2 refills | Status: DC

## 2016-08-24 MED ORDER — LIDOCAINE HCL 1 % IJ SOLN
5.0000 mL | INTRAMUSCULAR | Status: AC | PRN
  Administered 2016-08-24: 5 mL

## 2016-08-24 MED ORDER — TRIAMCINOLONE ACETONIDE 40 MG/ML IJ SUSP
40.0000 mg | INTRAMUSCULAR | Status: AC | PRN
  Administered 2016-08-24: 40 mg via INTRA_ARTICULAR

## 2016-08-24 MED ORDER — BUPIVACAINE HCL 0.25 % IJ SOLN
4.0000 mL | INTRAMUSCULAR | Status: AC | PRN
  Administered 2016-08-24: 4 mL via INTRA_ARTICULAR

## 2016-08-24 NOTE — Progress Notes (Signed)
Office Visit Note   Patient: Nicholas Horn           Date of Birth: Dec 12, 1960           MRN: [id number] Visit Date: 08/24/2016 Requested by: Rosine Abe, DO [address] [place], [place] [address] PCP: Drema Pry, DO  Subjective: Chief Complaint  Patient presents with  . Right Hand - Fracture  . Right Hip - Pain    HPI Nicholas Horn is a 56 year old patient with right hand pain and right hip pain.  He injured himself with a fall 08/21/2016.  Those radiographs are reviewed and it shows an avulsion type injury off of the proximal phalanx third finger. She he also  He also describes right hip pain.  This is been on for 3 months.  Dr. pulled it lumbar surgery and feels like this may be bursitis.  Pain localizes to the greater trochanteric region.  He does see pain management.  Percocet is what he is taking along with meloxicam and Robaxin.  He's considering spinal cord stimulator.  He denies any groin pain.  He states that the pain will wake him up when he sleeping on that side at night.              Review of Systems All systems reviewed are negative as they relate to the chief complaint within the history of present illness.  Patient denies  fevers or chills.    Assessment & Plan: Visit Diagnoses:  1. Right hip pain   2. Closed nondisplaced fracture of proximal phalanx of right middle finger, initial encounter     Plan: Impression is right hand pain with dorsal swelling consistent with a partial ligament injury on the base of the right proximal phalanx.  His extensor tendon is well centered.  That's something that I would favor observation 4.  He needs continue with range of motion.  In regards to the right hip this doesn't look like trochanteric bursitis.  Ultrasound-guided injection is performed today.  We'll see how he does with that.  I'm also going to try him on some NSAID and and stretching which I showed him.  I'll see him back as needed Follow-Up Instructions: Return if  symptoms worsen or fail to improve.   Orders:  Orders Placed This Encounter  Procedures  . XR HIP UNILAT W OR W/O PELVIS 2-3 VIEWS RIGHT   No orders of the defined types were placed in this encounter.     Procedures: Large Joint Inj Date/Time: 08/24/2016 11:46 AM Performed by: Meredith Pel Authorized by: Meredith Pel   Consent Given by:  Patient Site marked: the procedure site was marked   Timeout: prior to procedure the correct patient, procedure, and site was verified   Indications:  Pain and diagnostic evaluation Location:  Hip Site:  R greater trochanter Prep: patient was prepped and draped in usual sterile fashion   Needle Size:  18 G Needle Length:  3.5 inches Approach:  Lateral Ultrasound Guidance: Yes   Fluoroscopic Guidance: No   Arthrogram: No   Medications:  5 mL lidocaine 1 %; 40 mg triamcinolone acetonide 40 MG/ML; 4 mL bupivacaine 0.25 % Aspiration Attempted: No   Patient tolerance:  Patient tolerated the procedure well with no immediate complications       Clinical Data: No additional findings.  Objective: Vital Signs: There were no vitals taken for this visit.  Physical Exam   Constitutional: Patient appears well-developed HEENT:  Head: Normocephalic  Eyes:EOM are normal Neck: Normal range of motion Cardiovascular: Normal rate Pulmonary/chest: Effort normal Neurologic: Patient is alert Skin: Skin is warm Psychiatric: Patient has normal mood and affect    Ortho Exam orthopedic exam demonstrates tenderness to palpation of the trochanter on the right compared to the left.  No groin pain with internal/external rotation of the leg.  No nerve retention signs.  Palpable pedal pulses.  Symmetric reflexes.  Good ankle dorsi and plantar flexion quite after strength.  No other masses lymph adenopathy or skin changes noted in the right hip or groin region.  Right hand is examined.  He has full composite extension and flexion except in that  third finger.  Extensor tendon is centered over the metacarpal 1 through 5.  Perfusion sensation to the hand is intact.  There is no rotational deformity.  Both flexor tendons to the third second and fourth fingers are functional and palpable.  Extensor tendons to 3 and 4 also functional and palpable.  Specialty Comments:  No specialty comments available.  Imaging: No results found.   PMFS History: Patient Active Problem List   Diagnosis Date Noted  . Closed nondisplaced fracture of proximal phalanx of right middle finger 08/24/2016  . Right hip pain 08/24/2016  . S/P laparoscopic appendectomy 10/02/2014  . Left knee pain 07/01/2013  . Morbid obesity ([place]) 01/23/2013  . Left lumbar radiculopathy 01/23/2013  . Heel pain 01/23/2013  . HYPERLIPIDEMIA 09/29/2008  . SLEEP APNEA, OBSTRUCTIVE 09/29/2008  . HEADACHE, SEVERE 09/29/2008   Past Medical History:  Diagnosis Date  . Arthritis   . Back pain, chronic   . EKG abnormality 2007   stress test and cardiac evaluation [facility] cardiology  . PONV (postoperative nausea and vomiting)   . Shortness of breath    with exertion  . Sleep apnea 07  . Vision impairment    wears reading glasses    Family History  Problem Relation Age of Onset  . Heart disease Mother 62    died of heart disease, age 23yo  . Obesity Mother   . Hypertension Mother   . Hyperlipidemia Mother   . Cancer Father     died of stomach and intestinal cancer  . Heart disease Brother     CAD, stent  . Other Brother     substance abuse  . Diabetes Neg Hx   . Stroke Neg Hx   . Colon cancer Neg Hx     Past Surgical History:  Procedure Laterality Date  . APPENDECTOMY    . BACK SURGERY  2014   Micro disectomy l3-4  . CARDIAC CATHETERIZATION  2007   due to equivocal stress test.  normal cath per pt report  . COLONOSCOPY    . 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 Right 07/15/2013   Procedure: Resection right Haglund Deformity;  Surgeon:  Newt Minion, MD;  Location: [facility];  Service: Orthopedics;  Laterality: Right;  Resection right Haglund Deformity  . KNEE ARTHROSCOPY Left 2014  . KNEE SURGERY    . LAPAROSCOPIC APPENDECTOMY N/A 10/01/2014   Procedure: APPENDECTOMY LAPAROSCOPIC;  Surgeon: Georganna Skeans, MD;  Location: [facility] OR;  Service: General;  Laterality: N/A;  . WISDOM TOOTH EXTRACTION     age 89yo   Social History   Occupational History  . 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 Smoking status: Former Smoker    Years: 7.00    Types: Cigarettes    Start date: 09/06/1985  .  Smokeless tobacco: Never Used     Comment: quit in 1987  . Alcohol use No  . Drug use: No  . Sexual activity: Not on file

## 2016-08-24 NOTE — Telephone Encounter (Signed)
Pt stated a topical ointment for pain we sent in for him but pharmacy wouldn't fill this due to insurance.   He asked if we could send in a generic into local pharmacy   [facility] on [address].  [phone]

## 2016-08-24 NOTE — Addendum Note (Signed)
Addended byBrand Males on: 08/24/2016 01:44 PM   Modules accepted: Orders

## 2016-08-27 NOTE — Telephone Encounter (Signed)
IC pt and advised rx went to pharm in [place], they will be calling him to arrange shipment of rx to him

## 2016-09-18 ENCOUNTER — Ambulatory Visit (INDEPENDENT_AMBULATORY_CARE_PROVIDER_SITE_OTHER): Payer: Medicaid Other | Admitting: Orthopedic Surgery

## 2016-09-18 ENCOUNTER — Encounter (INDEPENDENT_AMBULATORY_CARE_PROVIDER_SITE_OTHER): Payer: Self-pay | Admitting: Orthopedic Surgery

## 2016-09-18 ENCOUNTER — Ambulatory Visit (INDEPENDENT_AMBULATORY_CARE_PROVIDER_SITE_OTHER): Payer: Medicaid Other

## 2016-09-18 DIAGNOSIS — M25562 Pain in left knee: Secondary | ICD-10-CM

## 2016-09-18 NOTE — Progress Notes (Signed)
Office Visit Note   Patient: Nicholas Horn           Date of Birth: 1961/01/23           MRN: [id number] Visit Date: 09/18/2016 Requested by: Rosine Abe, DO [place], [place] [address] PCP: Drema Pry, DO  Subjective: Chief Complaint  Patient presents with  . Left Knee - Pain    HPI Shreyas Dickhaut is a 56 year old patient with left knee pain.  Every time he steps he has sharp pain medially.  This is been going on for several weeks.  Denies any history of injury.  Did have medial meniscal surgery 03/04/2013 andpictures are reviewed today.  Did have early degenerative changes at that time as well as over 50% partial medial meniscectomy performed.  He denies any locking symptoms but does report pain and this is a daily occurrence.  He is on Percocet meloxicam and Robaxin primarily for his back.  He did very well with the trochanteric bursa injection.  His fingers also improving on the right-hand side third finger dorsally but still has some pain in that area.  No real popping or clicking indicating tendon subluxation in that area.              Review of Systems All systems reviewed are negative as they relate to the chief complaint within the history of present illness.  Patient denies  fevers or chills.    Assessment & Plan: Visit Diagnoses:  1. Acute pain of left knee     Plan: Impression is left knee pain medially with the potential for progressive arthritic development versus new meniscal tearing.  MRI scan will help [place] to determine if there is anything treatable arthroscopically in the knee or this is something that will have to wait out until he is ready for partial versus total knee replacement.  I think the MRI scan also help to evaluate the other compartments.  That will also help and the decision for partial versus total knee replacement should it come to that.  Osteotomy is a consideration but he doesn't have much varus alignment to correct.  I'll see him  back after that MRI scan  Follow-Up Instructions: Return for after MRI.   Orders:  Orders Placed This Encounter  Procedures  . XR Knee 1-2 Views Left   No orders of the defined types were placed in this encounter.     Procedures: No procedures performed   Clinical Data: No additional findings.  Objective: Vital Signs: There were no vitals taken for this visit.  Physical Exam   Constitutional: Patient appears well-developed HEENT:  Head: Normocephalic Eyes:EOM are normal Neck: Normal range of motion Cardiovascular: Normal rate Pulmonary/chest: Effort normal Neurologic: Patient is alert Skin: Skin is warm Psychiatric: Patient has normal mood and affect    Ortho Exam orthopedic exam demonstrates pretty normal gait alignment no effusion in the left knee no groin pain with internal/external rotation leg palpable pedal pulses full range of motion medial greater than lateral joint line tenderness mild patellofemoral crepitus bilaterally no other masses lymph adenopathy or skin changes noted in the left knee region  Specialty Comments:  No specialty comments available.  Imaging: No results found.   PMFS History: Patient Active Problem List   Diagnosis Date Noted  . Closed nondisplaced fracture of proximal phalanx of right middle finger 08/24/2016  . Right hip pain 08/24/2016  . S/P laparoscopic appendectomy 10/02/2014  . Left knee pain 07/01/2013  .  Morbid obesity ([place]) 01/23/2013  . Left lumbar radiculopathy 01/23/2013  . Heel pain 01/23/2013  . HYPERLIPIDEMIA 09/29/2008  . SLEEP APNEA, OBSTRUCTIVE 09/29/2008  . HEADACHE, SEVERE 09/29/2008   Past Medical History:  Diagnosis Date  . Arthritis   . Back pain, chronic   . EKG abnormality 2007   stress test and cardiac evaluation [facility] cardiology  . PONV (postoperative nausea and vomiting)   . Shortness of breath    with exertion  . Sleep apnea 07  . Vision impairment    wears reading glasses      Family History  Problem Relation Age of Onset  . Heart disease Mother 41    died of heart disease, age 20yo  . Obesity Mother   . Hypertension Mother   . Hyperlipidemia Mother   . Cancer Father     died of stomach and intestinal cancer  . Heart disease Brother     CAD, stent  . Other Brother     substance abuse  . Diabetes Neg Hx   . Stroke Neg Hx   . Colon cancer Neg Hx     Past Surgical History:  Procedure Laterality Date  . APPENDECTOMY    . BACK SURGERY  2014   Micro disectomy l3-4  . CARDIAC CATHETERIZATION  2007   due to equivocal stress test.  normal cath per pt report  . COLONOSCOPY    . 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 KNEE ARTHROSCOPY Left 2014  . KNEE SURGERY    . LAPAROSCOPIC APPENDECTOMY N/A 10/01/2014   Procedure: APPENDECTOMY LAPAROSCOPIC;  Surgeon: Georganna Skeans, MD;  Location: [facility] OR;  Service: General;  Laterality: N/A;  . WISDOM TOOTH EXTRACTION     age 47yo   Social History   Occupational History  . 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 Smoking status: Former Smoker    Years: 7.00    Types: Cigarettes    Start date: 09/06/1985  . Smokeless tobacco: Never Used     Comment: quit in 1987  . Alcohol use No  . Drug use: No  . Sexual activity: Not on file

## 2016-09-19 ENCOUNTER — Ambulatory Visit (INDEPENDENT_AMBULATORY_CARE_PROVIDER_SITE_OTHER): Payer: Medicaid Other

## 2016-09-19 ENCOUNTER — Ambulatory Visit (INDEPENDENT_AMBULATORY_CARE_PROVIDER_SITE_OTHER): Payer: Medicaid Other | Admitting: Orthopedic Surgery

## 2016-09-19 ENCOUNTER — Encounter (INDEPENDENT_AMBULATORY_CARE_PROVIDER_SITE_OTHER): Payer: Self-pay | Admitting: Orthopedic Surgery

## 2016-09-19 DIAGNOSIS — M79641 Pain in right hand: Secondary | ICD-10-CM

## 2016-09-19 NOTE — Progress Notes (Signed)
Office Visit Note   Patient: Nicholas Horn           Date of Birth: 14-Jun-1961           MRN: [id number] Visit Date: 09/19/2016 Requested by: Rosine Abe, DO [place],  [address] PCP: Drema Pry, DO  Subjective: Chief Complaint  Patient presents with  . Right Hand - Pain, Injury    HPI Nicholas Horn is a 56 year old patient with right hand pain.  He is right-hand-dominant.  A piece of metal fell and hit him this morning.  Injured the dorsal aspect of his hand patient states his tetanus is up-to-date.  This was not a puncture wound as it was more of an impact injury but he did have some breaking of the skin.              Review of Systems All systems reviewed are negative as they relate to the chief complaint within the history of present illness.  Patient denies  fevers or chills.    Assessment & Plan: Visit Diagnoses:  1. Pain of right hand     Plan: Impression is dorsal hand injury with puncture type wound which is probably more of an impact-type wound but it did give him some soft tissue swelling on the dorsal aspect of his hand.  All extensor tendons are functional and intact.  Irrigation and debridement is performed today.  Primarily irrigation with about 30 mL of saline after numbing up the skin with plain Marcaine.  That laceration is about 5 mm across.  After irrigating it with the saline I packed it with a drain and taped into position.  Instructed the patient to pull that drain out tomorrow.  I'll see him back on Friday.  Do not plan on doing antibiotics yet unless there is evidence of infection on Friday. Follow-Up Instructions: Return in about 2 days (around 09/21/2016).   Orders:  Orders Placed This Encounter  Procedures  . XR Hand Complete Right   No orders of the defined types were placed in this encounter.     Procedures: Incision & Drainage Date/Time: 09/19/2016 5:47 PM Performed by: Meredith Pel Authorized by: Meredith Pel   Consent:    Consent obtained:  Verbal   Consent given by:  Patient   Risks discussed:  Bleeding   Alternatives discussed:  Observation Location:    Type:  Hematoma   Location:  Upper extremity   Upper extremity location:  Hand Pre-procedure details:    Skin preparation:  Alcohol Anesthesia (see MAR for exact dosages):    Anesthesia method:  Local infiltration   Local anesthetic:  Lidocaine 1% w/o epi Procedure type:    Complexity:  Simple Procedure details:    Needle aspiration: no     Incision depth:  Dermal   Wound management:  Irrigated with saline   Drainage amount:  Scant   Wound treatment:  Wound left open and drain placed   Packing materials:  Wick placed Post-procedure details:    Patient tolerance of procedure:  Tolerated well, no immediate complications Comments:     Essentially this was a puncture wound on the hand.  I irrigated it with about 30 mL of saline after numbing up the skin and packed it with a drain so that it would stay open.  Patient instructed to remove the drain tomorrow and I'll see him in 48 hours for repeat assessment there was some seroma within this dorsal  swelling which was expressed.  Irrigation then performed.     Clinical Data: No additional findings.  Objective: Vital Signs: There were no vitals taken for this visit.  Physical Exam   Constitutional: Patient appears well-developed HEENT:  Head: Normocephalic Eyes:EOM are normal Neck: Normal range of motion Cardiovascular: Normal rate Pulmonary/chest: Effort normal Neurologic: Patient is alert Skin: Skin is warm Psychiatric: Patient has normal mood and affect    Ortho Exam hand exam demonstrates dorsal swelling with this 5 mm puncture wound at the midportion of the back of the hand.  Extensor and flexor tendons are intact.  Wrist range of motion is full.  There is no real redness or fluctuance or erythema around this small puncture laceration.    Special  The  rightComments:  No specialty comments available.  Imaging: Xr Hand Complete Right  Result Date: 09/19/2016 AP lateral oblique right hand reviewed.  No fractures present.  Some dorsal soft tissue swelling is present.  No foreign bodies.    PMFS History: Patient Active Problem List   Diagnosis Date Noted  . Closed nondisplaced fracture of proximal phalanx of right middle finger 08/24/2016  . Right hip pain 08/24/2016  . S/P laparoscopic appendectomy 10/02/2014  . Left knee pain 07/01/2013  . Morbid obesity ([place]) 01/23/2013  . Left lumbar radiculopathy 01/23/2013  . Heel pain 01/23/2013  . HYPERLIPIDEMIA 09/29/2008  . SLEEP APNEA, OBSTRUCTIVE 09/29/2008  . HEADACHE, SEVERE 09/29/2008   Past Medical History:  Diagnosis Date  . Arthritis   . Back pain, chronic   . EKG abnormality 2007   stress test and cardiac evaluation [facility] cardiology  . PONV (postoperative nausea and vomiting)   . Shortness of breath    with exertion  . Sleep apnea 07  . Vision impairment    wears reading glasses    Family History  Problem Relation Age of Onset  . Heart disease Mother 82    died of heart disease, age 82yo  . Obesity Mother   . Hypertension Mother   . Hyperlipidemia Mother   . Cancer Father     died of stomach and intestinal cancer  . Heart disease Brother     CAD, stent  . Other Brother     substance abuse  . Diabetes Neg Hx   . Stroke Neg Hx   . Colon cancer Neg Hx     Past Surgical History:  Procedure Laterality Date  . APPENDECTOMY    . BACK SURGERY  2014   Micro disectomy l3-4  . CARDIAC CATHETERIZATION  2007   due to equivocal stress test.  normal cath per pt report  . COLONOSCOPY    . 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 KNEE ARTHROSCOPY Left 2014  .  KNEE SURGERY    . LAPAROSCOPIC APPENDECTOMY N/A 10/01/2014   Procedure: APPENDECTOMY LAPAROSCOPIC;  Surgeon: Georganna Skeans, MD;  Location: [facility] OR;  Service: General;  Laterality: N/A;  . WISDOM TOOTH EXTRACTION     age 64yo   Social History   Occupational History  . 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 Smoking status: Former Smoker    Years: 7.00    Types: Cigarettes    Start date: 09/06/1985  . Smokeless tobacco: Never Used     Comment: quit  in 1987  . Alcohol use No  . Drug use: No  . Sexual activity: Not on file

## 2016-09-21 ENCOUNTER — Ambulatory Visit (INDEPENDENT_AMBULATORY_CARE_PROVIDER_SITE_OTHER): Payer: Medicaid Other | Admitting: Orthopedic Surgery

## 2016-09-21 ENCOUNTER — Encounter (INDEPENDENT_AMBULATORY_CARE_PROVIDER_SITE_OTHER): Payer: Self-pay | Admitting: Orthopedic Surgery

## 2016-09-21 DIAGNOSIS — M79641 Pain in right hand: Secondary | ICD-10-CM

## 2016-09-22 NOTE — Progress Notes (Signed)
Office Visit Note   Patient: Nicholas Horn           Date of Birth: July 19, 1961           MRN: [id number] Visit Date: 09/21/2016 Requested by: Rosine Abe, DO [address] [place], [place] [address] PCP: Drema Pry, DO  Subjective: Chief Complaint  Patient presents with  . Right Hand - Injury, Follow-up    HPI Kendra is a 56 year old patient with injury to his right hand 2 days ago.  Irrigation performed at that time and a drain was placed.  Patient removed the drain yesterday.  He's having some swelling but reports generally manageable symptoms.              Review of Systems All systems reviewed are negative as they relate to the chief complaint within the history of present illness.  Patient denies  fevers or chills.    Assessment & Plan: Visit Diagnoses:  1. Pain in right hand   2. Pain of right hand     Plan: Impression is blunt traumatic injury dorsal aspect of the hand with skin puncturing.  Irrigation performed.  For the energy involved the amount of swelling on the dorsal aspect of the hand appears appropriate.  No evidence of infection at this time.  Should he develop worsening pain or redness then he will need to come back and we will likely place him on antibiotics but for now it appears that that will not be necessary.  I will see him back or call him once the results of his knee MRI scan comes in.  Follow-Up Instructions: Return if symptoms worsen or fail to improve.   Orders:  No orders of the defined types were placed in this encounter.  No orders of the defined types were placed in this encounter.     Procedures: No procedures performed   Clinical Data: No additional findings.  Objective: Vital Signs: There were no vitals taken for this visit.  Physical Exam   Constitutional: Patient appears well-developed HEENT:  Head: Normocephalic Eyes:EOM are normal Neck: Normal range of motion Cardiovascular: Normal rate Pulmonary/chest:  Effort normal Neurologic: Patient is alert Skin: Skin is warm Psychiatric: Patient has normal mood and affect   Ortho Exam orthopedic exam demonstrates a little bit of restricted flexion of the hand but full extension as possible.  Dorsal swelling is present but there is no fluctuance or erythema or induration around this centrally located small puncture wound.  All extensor tendons are functional.  Specialty Comments:  No specialty comments available.  Imaging: No results found.   PMFS History: Patient Active Problem List   Diagnosis Date Noted  . Pain in right hand 09/21/2016  . Closed nondisplaced fracture of proximal phalanx of right middle finger 08/24/2016  . Right hip pain 08/24/2016  . S/P laparoscopic appendectomy 10/02/2014  . Left knee pain 07/01/2013  . Morbid obesity ([place]) 01/23/2013  . Left lumbar radiculopathy 01/23/2013  . Heel pain 01/23/2013  . HYPERLIPIDEMIA 09/29/2008  . SLEEP APNEA, OBSTRUCTIVE 09/29/2008  . HEADACHE, SEVERE 09/29/2008   Past Medical History:  Diagnosis Date  . Arthritis   . Back pain, chronic   . EKG abnormality 2007   stress test and cardiac evaluation [facility] cardiology  . PONV (postoperative nausea and vomiting)   . Shortness of breath    with exertion  . Sleep apnea 07  . Vision impairment    wears reading glasses    Family  History  Problem Relation Age of Onset  . Heart disease Mother 70    died of heart disease, age 89yo  . Obesity Mother   . Hypertension Mother   . Hyperlipidemia Mother   . Cancer Father     died of stomach and intestinal cancer  . Heart disease Brother     CAD, stent  . Other Brother     substance abuse  . Diabetes Neg Hx   . Stroke Neg Hx   . Colon cancer Neg Hx     Past Surgical History:  Procedure Laterality Date  . APPENDECTOMY    . BACK SURGERY  2014   Micro disectomy l3-4  . CARDIAC CATHETERIZATION  2007   due to equivocal stress test.  normal cath per pt report  . COLONOSCOPY      . 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 KNEE ARTHROSCOPY Left 2014  . KNEE SURGERY    . LAPAROSCOPIC APPENDECTOMY N/A 10/01/2014   Procedure: APPENDECTOMY LAPAROSCOPIC;  Surgeon: Georganna Skeans, MD;  Location: [facility] OR;  Service: General;  Laterality: N/A;  . WISDOM TOOTH EXTRACTION     age 16yo   Social History   Occupational History  . 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 Smoking status: Former Smoker    Years: 7.00    Types: Cigarettes    Start date: 09/06/1985  . Smokeless tobacco: Never Used     Comment: quit in 1987  . Alcohol use No  . Drug use: No  . Sexual activity: Not on file

## 2016-09-27 ENCOUNTER — Telehealth (INDEPENDENT_AMBULATORY_CARE_PROVIDER_SITE_OTHER): Payer: Self-pay | Admitting: Orthopedic Surgery

## 2016-09-27 ENCOUNTER — Ambulatory Visit
Admission: RE | Admit: 2016-09-27 | Discharge: 2016-09-27 | Disposition: A | Discharge status: Home / Self Care | Payer: Medicaid Other | Source: Ambulatory Visit | Attending: Orthopedic Surgery | Admitting: Orthopedic Surgery

## 2016-09-27 DIAGNOSIS — M25562 Pain in left knee: Secondary | ICD-10-CM

## 2016-09-27 NOTE — Telephone Encounter (Signed)
Patient called to let Dr.Dean know he had his MRI done. He is requesting to come in tomorrow if there is a cancellation (i let him know there was a full schedule as of now)  Cb#: [phone]

## 2016-09-27 NOTE — Telephone Encounter (Signed)
Want to call him versus waiting for appt?

## 2016-10-04 NOTE — Telephone Encounter (Signed)
I called no answer

## 2016-10-04 NOTE — Telephone Encounter (Signed)
Dr Marlou Sa called, s/w patient

## 2016-10-08 ENCOUNTER — Ambulatory Visit (INDEPENDENT_AMBULATORY_CARE_PROVIDER_SITE_OTHER): Payer: Medicaid Other | Admitting: Orthopedic Surgery

## 2016-10-08 ENCOUNTER — Encounter (INDEPENDENT_AMBULATORY_CARE_PROVIDER_SITE_OTHER): Payer: Self-pay | Admitting: Orthopedic Surgery

## 2016-10-08 DIAGNOSIS — M79641 Pain in right hand: Secondary | ICD-10-CM

## 2016-10-08 DIAGNOSIS — M1712 Unilateral primary osteoarthritis, left knee: Secondary | ICD-10-CM | POA: Diagnosis not present

## 2016-10-09 DIAGNOSIS — M1712 Unilateral primary osteoarthritis, left knee: Secondary | ICD-10-CM | POA: Diagnosis not present

## 2016-10-09 MED ORDER — LIDOCAINE HCL 1 % IJ SOLN
5.0000 mL | INTRAMUSCULAR | Status: AC | PRN
  Administered 2016-10-09: 5 mL

## 2016-10-09 MED ORDER — BUPIVACAINE HCL 0.25 % IJ SOLN
4.0000 mL | INTRAMUSCULAR | Status: AC | PRN
  Administered 2016-10-09: 4 mL via INTRA_ARTICULAR

## 2016-10-09 MED ORDER — METHYLPREDNISOLONE ACETATE 40 MG/ML IJ SUSP
40.0000 mg | INTRAMUSCULAR | Status: AC | PRN
  Administered 2016-10-09: 40 mg via INTRA_ARTICULAR

## 2016-10-09 NOTE — Progress Notes (Signed)
Office Visit Note   Patient: Nicholas Horn           Date of Birth: [birth year]           MRN: [id number] Visit Date: 10/08/2016 Requested by: Rosine Abe, DO [address] [place], [place] [address] PCP: Drema Pry, DO  Subjective: Chief Complaint  Patient presents with  . Left Knee - Pain, Follow-up  . Right Hand - Pain, Follow-up    HPI: Nicholas Horn is a 56 year old patient with left knee pain.  Since of seeming 7 MRI scan of the left knee.  Also had a right hand injury 09/21/2016 which is doing reasonably well but still has some Swelling.  He is very functional with the right hand.  MRI scan on the left knee shows no meniscal tears some arthritis primarily in the medial compartment and to a lesser degree in the patellofemoral compartment.              ROS: All systems reviewed are negative as they relate to the chief complaint within the history of present illness.  Patient denies  fevers or chills.   Assessment & Plan: Visit Diagnoses:  1. Pain in right hand   2. Primary osteoarthritis of left knee     Plan: Impression is left knee pain with some mild medial compartment arthritis.  Injection performed today.  Nonweightbearing quad strengthening exercises encouraged.  Nothing to do from an arthroscopic standpoint in the knee at this time.  Could consider continuing alternating Cortizone and gel injections in the future.  We'll need to follow the hand on an as-needed basis.  Follow-Up Instructions: Return if symptoms worsen or fail to improve.   Orders:  No orders of the defined types were placed in this encounter.  No orders of the defined types were placed in this encounter.     Procedures: Large Joint Inj Date/Time: 10/09/2016 5:11 PM Performed by: Meredith Pel Authorized by: Meredith Pel   Consent Given by:  Patient Site marked: the procedure site was marked   Timeout: prior to procedure the correct patient, procedure, and site was verified     Indications:  Pain, joint swelling and diagnostic evaluation Location:  Knee Site:  L knee Prep: patient was prepped and draped in usual sterile fashion   Needle Size:  18 G Needle Length:  1.5 inches Approach:  Superolateral Ultrasound Guidance: No   Fluoroscopic Guidance: No   Arthrogram: No   Medications:  5 mL lidocaine 1 %; 4 mL bupivacaine 0.25 %; 40 mg methylPREDNISolone acetate 40 MG/ML Patient tolerance:  Patient tolerated the procedure well with no immediate complications     Clinical Data: No additional findings.  Objective: Vital Signs: There were no vitals taken for this visit.  Physical Exam:  Constitutional: Patient appears well-developed HEENT:  Head: Normocephalic Eyes:EOM are normal Neck: Normal range of motion Cardiovascular: Normal rate Pulmonary/chest: Effort normal Neurologic: Patient is alert Skin: Skin is warm Psychiatric: Patient has normal mood and affect    Ortho Exam: Orthopedic exam demonstrates good range of motion the left knee with no effusion medial joint line tenderness is present collateral crucial ligaments are stable extensor mechanism is intact no other masses lymph adenopathy or skin changes noted in the left knee region.  Patient has reasonable grip strength with no real erythema on the dorsal aspect of the right hand.  Specialty Comments:  No specialty comments available.  Imaging: No results found.   PMFS History: Patient  Active Problem List   Diagnosis Date Noted  . Primary osteoarthritis of left knee 10/08/2016  . Pain in right hand 09/21/2016  . Closed nondisplaced fracture of proximal phalanx of right middle finger 08/24/2016  . Right hip pain 08/24/2016  . S/P laparoscopic appendectomy 10/02/2014  . Left knee pain 07/01/2013  . Morbid obesity ([place]) 01/23/2013  . Left lumbar radiculopathy 01/23/2013  . Heel pain 01/23/2013  . HYPERLIPIDEMIA 09/29/2008  . SLEEP APNEA, OBSTRUCTIVE 09/29/2008  . HEADACHE, SEVERE  09/29/2008   Past Medical History:  Diagnosis Date  . Arthritis   . Back pain, chronic   . EKG abnormality 2007   stress test and cardiac evaluation [facility] cardiology  . PONV (postoperative nausea and vomiting)   . Shortness of breath    with exertion  . Sleep apnea 07  . Vision impairment    wears reading glasses    Family History  Problem Relation Age of Onset  . Heart disease Mother 42    died of heart disease, age 23yo  . Obesity Mother   . Hypertension Mother   . Hyperlipidemia Mother   . Cancer Father     died of stomach and intestinal cancer  . Heart disease Brother     CAD, stent  . Other Brother     substance abuse  . Diabetes Neg Hx   . Stroke Neg Hx   . Colon cancer Neg Hx     Past Surgical History:  Procedure Laterality Date  . APPENDECTOMY    . BACK SURGERY  2014   Micro disectomy l3-4  . CARDIAC CATHETERIZATION  2007   due to equivocal stress test.  normal cath per pt report  . COLONOSCOPY    . 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 KNEE ARTHROSCOPY Left 2014  . KNEE SURGERY    . LAPAROSCOPIC APPENDECTOMY N/A 10/01/2014   Procedure: APPENDECTOMY LAPAROSCOPIC;  Surgeon: Georganna Skeans, MD;  Location: [facility] OR;  Service: General;  Laterality: N/A;  . WISDOM TOOTH EXTRACTION     age 46yo   Social History   Occupational History  . 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 Smoking status: Former Smoker    Years: 7.00    Types: Cigarettes    Start date: 09/06/1985  . Smokeless tobacco: Never Used     Comment: quit in 1987  . Alcohol use No  . Drug use: No  . Sexual activity: Not on file

## 2016-10-10 ENCOUNTER — Encounter (HOSPITAL_COMMUNITY): Payer: Self-pay | Admitting: Emergency Medicine

## 2016-10-10 ENCOUNTER — Emergency Department (HOSPITAL_COMMUNITY): Payer: Medicaid Other

## 2016-10-10 DIAGNOSIS — Z87891 Personal history of nicotine dependence: Secondary | ICD-10-CM | POA: Insufficient documentation

## 2016-10-10 DIAGNOSIS — R0789 Other chest pain: Secondary | ICD-10-CM | POA: Insufficient documentation

## 2016-10-10 LAB — I-STAT TROPONIN, ED: Troponin i, poc: 0 ng/mL (ref 0.00–0.08)

## 2016-10-10 NOTE — ED Triage Notes (Signed)
Pt to ED with c/o chest pain, onset earlier today.  Slight nausea without vomiting.  St's he did feel shortness of breath.

## 2016-10-11 ENCOUNTER — Emergency Department (HOSPITAL_COMMUNITY)
Admission: EM | Admit: 2016-10-11 | Discharge: 2016-10-11 | Disposition: A | Discharge status: Home / Self Care | Payer: Medicaid Other | Attending: Emergency Medicine | Admitting: Emergency Medicine

## 2016-10-11 DIAGNOSIS — R0789 Other chest pain: Secondary | ICD-10-CM

## 2016-10-11 LAB — CBC WITH DIFFERENTIAL/PLATELET
BASOS ABS: 0.1 10*3/uL (ref 0.0–0.1)
BASOS PCT: 1 %
EOS ABS: 0.2 10*3/uL (ref 0.0–0.7)
EOS PCT: 2 %
HCT: 42.2 % (ref 39.0–52.0)
HEMOGLOBIN: 14.7 g/dL (ref 13.0–17.0)
Lymphocytes Relative: 37 %
Lymphs Abs: 3.1 10*3/uL (ref 0.7–4.0)
MCH: 32 pg (ref 26.0–34.0)
MCHC: 34.8 g/dL (ref 30.0–36.0)
MCV: 91.7 fL (ref 78.0–100.0)
Monocytes Absolute: 0.8 10*3/uL (ref 0.1–1.0)
Monocytes Relative: 9 %
NEUTROS PCT: 51 %
Neutro Abs: 4.3 10*3/uL (ref 1.7–7.7)
PLATELETS: 183 10*3/uL (ref 150–400)
RBC: 4.6 MIL/uL (ref 4.22–5.81)
RDW: 12.5 % (ref 11.5–15.5)
WBC: 8.4 10*3/uL (ref 4.0–10.5)

## 2016-10-11 LAB — COMPREHENSIVE METABOLIC PANEL
ALBUMIN: 4 g/dL (ref 3.5–5.0)
ALK PHOS: 60 U/L (ref 38–126)
ALT: 24 U/L (ref 17–63)
AST: 20 U/L (ref 15–41)
Anion gap: 12 (ref 5–15)
BUN: 19 mg/dL (ref 6–20)
CALCIUM: 9.3 mg/dL (ref 8.9–10.3)
CO2: 26 mmol/L (ref 22–32)
CREATININE: 1.05 mg/dL (ref 0.61–1.24)
Chloride: 103 mmol/L (ref 101–111)
GFR calc Af Amer: >60 mL/min (ref >60)
GFR calc non Af Amer: >60 mL/min (ref >60)
GLUCOSE: 86 mg/dL (ref 65–99)
Potassium: 3.6 mmol/L (ref 3.5–5.1)
SODIUM: 141 mmol/L (ref 135–145)
Total Bilirubin: 0.7 mg/dL (ref 0.3–1.2)
Total Protein: 6.8 g/dL (ref 6.5–8.1)

## 2016-10-11 LAB — I-STAT TROPONIN, ED: Troponin i, poc: 0 ng/mL (ref 0.00–0.08)

## 2016-10-11 NOTE — ED Notes (Signed)
Recollect blood and sent to main lab

## 2016-10-11 NOTE — ED Provider Notes (Signed)
By signing my name below, I, Dyke Brackett, attest that this documentation has been prepared under the direction and in the presence of [place], DO . Electronically Signed: Dyke Brackett, Scribe. 10/11/2016. 2:40 AM.   TIME SEEN: 2:34 AM  CHIEF COMPLAINT:  Chief Complaint  Patient presents with  . Chest Pain     HPI: Nicholas Horn is a 56 y.o. male who presents to the Emergency Department complaining of gradually improving, moderate, pressure-like central chest pain without radiation onset tonight at around 8 pm. No alleviating or modifying factors noted.  Pt reports associated palpitations, SOB, and nausea.  Pt reports hx of cardiac stress test and cardiac catheterization in 2008. Per pt, the stress test was inconclusive and the catheterization was negative at that time. Pt denies any history of DM, HTN, HLD. He is a nonsmoker. No history of PE, DVT, exogenous estrogen use, recent fractures, surgery, trauma, hospitalization or prolonged travel. No lower extremity swelling or pain. No calf tenderness.  He denies any vomiting, diaphoresis, dizziness.  Felt like his heart was "beating hard". Now asymptomatic.  ROS: See HPI Constitutional: no fever  Eyes: no drainage  ENT: no runny nose   Cardiovascular:  chest pain  Resp: SOB  GI: no vomiting GU: no dysuria Integumentary: no rash  Allergy: no hives  Musculoskeletal: no leg swelling  Neurological: no slurred speech ROS otherwise negative  PAST MEDICAL HISTORY/PAST SURGICAL HISTORY:  Past Medical History:  Diagnosis Date  . Arthritis   . Back pain, chronic   . EKG abnormality 2007   stress test and cardiac evaluation [facility] cardiology  . PONV (postoperative nausea and vomiting)   . Shortness of breath    with exertion  . Sleep apnea 07  . Vision impairment    wears reading glasses    MEDICATIONS:  Prior to Admission medications   Medication Sig Start Date End Date Taking? Authorizing Provider  Diclofenac Sodium  (PENNSAID) 2 % SOLN Place 1 Squirt onto the skin 2 (two) times daily. 08/24/16   Meredith Pel, MD  meclizine (ANTIVERT) 25 MG tablet Take 1 tablet (25 mg total) by mouth every 6 (six) hours as needed for dizziness. 11/16/14   Liam Graham, PA-C  meloxicam (MOBIC) 15 MG tablet Take 15 mg by mouth daily.    Historical Provider, MD  methocarbamol (ROBAXIN) 500 MG tablet Take 500 mg by mouth every 8 (eight) hours as needed for muscle spasms.    Historical Provider, MD  omeprazole (PRILOSEC) 20 MG capsule Take 1 capsule (20 mg total) by mouth daily. 11/23/14   Ripley Fraise, MD  oxyCODONE-acetaminophen (PERCOCET/ROXICET) 5-325 MG per tablet Take 1 tablet by mouth every 6 (six) hours as needed for severe pain. 11/16/14   Liam Graham, PA-C    ALLERGIES:  Allergies  Allergen Reactions  . Honey     GI upset, nausea, throat itching    SOCIAL HISTORY:  Social History  Substance Use Topics  . Smoking status: Former Smoker    Years: 7.00    Types: Cigarettes    Start date: 09/06/1985  . Smokeless tobacco: Never Used     Comment: quit in 1987  . Alcohol use No    FAMILY HISTORY: Family History  Problem Relation Age of Onset  . Heart disease Mother 32    died of heart disease, age 11yo  . Obesity Mother   . Hypertension Mother   . Hyperlipidemia Mother   . Cancer Father  died of stomach and intestinal cancer  . Heart disease Brother     CAD, stent  . Other Brother     substance abuse  . Diabetes Neg Hx   . Stroke Neg Hx   . Colon cancer Neg Hx     EXAM: BP 145/98   Pulse (!) 58   Temp 97.7 F (36.5 C) (Oral)   Resp 11   Ht 6\' 4"  (1.93 m)   Wt 280 lb (127 kg)   SpO2 96%   BMI 34.08 kg/m  CONSTITUTIONAL: Alert and oriented and responds appropriately to questions. Well-appearing; well-nourished HEAD: Normocephalic EYES: Conjunctivae clear, pupils appear equal, EOMI ENT: normal nose; moist mucous membranes NECK: Supple, no meningismus, no nuchal rigidity, no LAD   CARD: RRR; S1 and S2 appreciated; no murmurs, no clicks, no rubs, no gallops RESP: Normal chest excursion without splinting or tachypnea; breath sounds clear and equal bilaterally; no wheezes, no rhonchi, no rales, no hypoxia or respiratory distress, speaking full sentences ABD/GI: Normal bowel sounds; non-distended; soft, non-tender, no rebound, no guarding, no peritoneal signs, no hepatosplenomegaly BACK:  The back appears normal and is non-tender to palpation, there is no CVA tenderness EXT: Normal ROM in all joints; non-tender to palpation; no edema; normal capillary refill; no cyanosis, no calf tenderness or swelling    SKIN: Normal color for age and race; warm; no rash NEURO: Moves all extremities equally PSYCH: The patient's mood and manner are appropriate. Grooming and personal hygiene are appropriate.  MEDICAL DECISION MAKING: Patient here with complaints of chest pain. At this time he is asymptomatic. He has had 2 negative sets of cardiac enzymes. He has no risk factors for cardiac disease other than age.  HEART score is a 2.  No risk factors for pulmonary embolus. Doubt dissection. I feel he is safe for discharge home with close outpatient follow up with his primary care physician as well as cardiology for an outpatient stress test. He is comfortable with this plan. Discussed return precautions.     EKG Interpretation  Date/Time:  Wednesday October 10 2016 21:01:12 EST Ventricular Rate:  59 PR Interval:  152 QRS Duration: 98 QT Interval:  386 QTC Calculation: 382 R Axis:   42 Text Interpretation:  Sinus bradycardia Septal infarct , age undetermined Abnormal ECG No significant change since last tracing in 2007 Confirmed by Kendon Sedeno,  DO, Nicholle Falzon [phone]) on 10/11/2016 2:22:47 AM      I personally performed the services described in this documentation, which was scribed in my presence. The recorded information has been reviewed and is accurate.    [place], DO 10/11/16 [phone]

## 2016-12-03 ENCOUNTER — Telehealth (INDEPENDENT_AMBULATORY_CARE_PROVIDER_SITE_OTHER): Payer: Self-pay | Admitting: Orthopedic Surgery

## 2016-12-03 NOTE — Telephone Encounter (Signed)
PT LEG IS SWELLING AND THE BACK OF HIS LEG IS THE SIZE OF A TENNIS BALL- WANTS TO KNOW IF HE NEEDS TO BE SEEN TODAY.  [phone]

## 2016-12-04 ENCOUNTER — Encounter (INDEPENDENT_AMBULATORY_CARE_PROVIDER_SITE_OTHER): Payer: Self-pay

## 2016-12-04 ENCOUNTER — Ambulatory Visit (INDEPENDENT_AMBULATORY_CARE_PROVIDER_SITE_OTHER): Payer: Medicaid Other | Admitting: Family

## 2016-12-04 DIAGNOSIS — M7122 Synovial cyst of popliteal space [Baker], left knee: Secondary | ICD-10-CM

## 2016-12-04 DIAGNOSIS — M1712 Unilateral primary osteoarthritis, left knee: Secondary | ICD-10-CM

## 2016-12-04 NOTE — Telephone Encounter (Signed)
I called and spoke with patient advised for him to come in at 845am. Apologized I was not in the office yesterday.

## 2016-12-04 NOTE — Progress Notes (Signed)
Office Visit Note   Patient: Nicholas Horn           Date of Birth: [birth year]           MRN: [id number] Visit Date: 12/04/2016              Requested by: Rosine Abe, DO [address] [place], [place] [address] PCP: Drema Pry, DO  No chief complaint on file.     HPI: The patient is a 56 year old gentleman patient of Dr. Marlou Sa. He presents today complaining of worsening of knee pain and swelling. Complaining times sleep last night. Of note he has been taking meloxicam for the last year. States it started to upset his stomach and did stop taking it about 4 days ago. Notes that this is about the same time that he had worsening of his knee pain and swelling.  Did have an MRI of his left knee this February which showed mucoid degeneration of the anterior cruciate ligament and PCL as well as a small meniscal tear. Did show a Baker cyst as well.  Assessment & Plan: Visit Diagnoses:  1. Primary osteoarthritis of left knee   2. Baker's cyst of knee, left     Plan: discussed that he may begin using a topical antibiotic inflammatory. He states he will resume his meloxicam is going to begin taking a PPI as well. He will follow-up in office as needed. Did provide him with a neoprene sleeve for some gentle compression.  Follow-Up Instructions: Return in about 4 weeks (around 01/01/2017), or if symptoms worsen or fail to improve, for c dean.   Left Knee Exam   Tenderness  The patient is experiencing tenderness in the medial joint line.  Range of Motion  The patient has normal left knee ROM.  Muscle Strength   The patient has normal left knee strength.  Tests  Varus: negative Valgus: negative  Other  Erythema: absent Swelling: mild Effusion: no effusion present  Comments:  Posterior fullness and tenderness      Patient is alert, oriented, no adenopathy, well-dressed, normal affect, normal respiratory effort.  Imaging: No results found.  Labs: Lab Results    Component Value Date   HGBA1C 5.2 01/27/2013    Orders:  No orders of the defined types were placed in this encounter.  No orders of the defined types were placed in this encounter.    Procedures: No procedures performed  Clinical Data: No additional findings.  ROS:  All other systems negative, except as noted in the HPI. Review of Systems  Constitutional: Negative for chills and fever.  Cardiovascular: Positive for leg swelling.  Musculoskeletal: Positive for arthralgias and joint swelling.    Objective: Vital Signs: There were no vitals taken for this visit.  Specialty Comments:  No specialty comments available.  PMFS History: Patient Active Problem List   Diagnosis Date Noted  . Primary osteoarthritis of left knee 10/08/2016  . Pain in right hand 09/21/2016  . Closed nondisplaced fracture of proximal phalanx of right middle finger 08/24/2016  . Right hip pain 08/24/2016  . S/P laparoscopic appendectomy 10/02/2014  . Left knee pain 07/01/2013  . Morbid obesity ([place]) 01/23/2013  . Left lumbar radiculopathy 01/23/2013  . Heel pain 01/23/2013  . HYPERLIPIDEMIA 09/29/2008  . SLEEP APNEA, OBSTRUCTIVE 09/29/2008  . HEADACHE, SEVERE 09/29/2008   Past Medical History:  Diagnosis Date  . Arthritis   . Back pain, chronic   . EKG abnormality 2007   stress test  and cardiac evaluation - [place] cardiology  . PONV (postoperative nausea and vomiting)   . Shortness of breath    with exertion  . Sleep apnea 07  . Vision impairment    wears reading glasses    Family History  Problem Relation Age of Onset  . Heart disease Mother 50    died of heart disease, age 20yo  . Obesity Mother   . Hypertension Mother   . Hyperlipidemia Mother   . Cancer Father     died of stomach and intestinal cancer  . Heart disease Brother     CAD, stent  . Other Brother     substance abuse  . Diabetes Neg Hx   . Stroke Neg Hx   . Colon cancer Neg Hx     Past Surgical History:   Procedure Laterality Date  . APPENDECTOMY    . BACK SURGERY  2014   Micro disectomy l3-4  . CARDIAC CATHETERIZATION  2007   due to equivocal stress test.  normal cath per pt report  . COLONOSCOPY    . 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 KNEE ARTHROSCOPY Left 2014  . KNEE SURGERY    . LAPAROSCOPIC APPENDECTOMY N/A 10/01/2014   Procedure: APPENDECTOMY LAPAROSCOPIC;  Surgeon: Georganna Skeans, MD;  Location: [facility] OR;  Service: General;  Laterality: N/A;  . WISDOM TOOTH EXTRACTION     age 55yo   Social History   Occupational History  . 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 Smoking status: Former Smoker    Years: 7.00    Types: Cigarettes    Start date: 09/06/1985  . Smokeless tobacco: Never Used     Comment: quit in 1987  . Alcohol use No  . Drug use: No  . Sexual activity: Not on file

## 2017-01-09 ENCOUNTER — Encounter (INDEPENDENT_AMBULATORY_CARE_PROVIDER_SITE_OTHER): Payer: Self-pay | Admitting: Orthopedic Surgery

## 2017-01-09 ENCOUNTER — Ambulatory Visit (INDEPENDENT_AMBULATORY_CARE_PROVIDER_SITE_OTHER): Payer: Medicaid Other | Admitting: Orthopedic Surgery

## 2017-01-09 DIAGNOSIS — M1712 Unilateral primary osteoarthritis, left knee: Secondary | ICD-10-CM | POA: Diagnosis not present

## 2017-01-09 MED ORDER — OXYCODONE-ACETAMINOPHEN 10-325 MG PO TABS: 1.0000 | ORAL_TABLET | Freq: Two times a day (BID) | ORAL | 0 refills | Status: DC | PRN

## 2017-01-11 NOTE — Progress Notes (Signed)
Office Visit Note   Patient: Nicholas Horn           Date of Birth: 1961-06-10           MRN: [id number] Visit Date: 01/09/2017 Requested by: Nicholas Horn, Nicholas R, DO No address on file PCP: Nicholas Abe, DO  Subjective: Chief Complaint  Patient presents with  . Left Knee - Follow-up, Pain    HPI: Nicholas Horn is a 56 year old patient with left knee pain.  States the pain is getting worse.  Pain management discontinued him from care because he refused to have some type of implant.  Reports pain in the knee and numbness in of the patella.  Has a history of osteoarthritis as well as medial femoral condyle chondromalacia.  States his left knee pops.  Takes meloxicam and Percocet.  That helps his back in his knee.  Denies much in the way of mechanical symptoms in the knee              ROS: All systems reviewed are negative as they relate to the chief complaint within the history of present illness.  Patient denies  fevers or chills.   Assessment & Plan: Visit Diagnoses:  1. Primary osteoarthritis of left knee     Plan: Impression is left knee pain.  I will think there is anything arthroscopically to be treated in the knee.  In order for him to pain management for his back issues.  I want to pre-approve him for Synvisc to be injected in 3 months.  I'll see him back at that time for planned injection  Follow-Up Instructions: Return if symptoms worsen or fail to improve.   Orders:  No orders of the defined types were placed in this encounter.  Meds ordered this encounter  Medications  . oxyCODONE-acetaminophen (PERCOCET) 10-325 MG tablet    Sig: Take 1 tablet by mouth every 12 (twelve) hours as needed for pain.    Dispense:  30 tablet    Refill:  0      Procedures: No procedures performed   Clinical Data: No additional findings.  Objective: Vital Signs: There were no vitals taken for this visit.  Physical Exam:   Constitutional: Patient appears well-developed HEENT:    Head: Normocephalic Eyes:EOM are normal Neck: Normal range of motion Cardiovascular: Normal rate Pulmonary/chest: Effort normal Neurologic: Patient is alert Skin: Skin is warm Psychiatric: Patient has normal mood and affect    Ortho Exam: Orthopedic exam demonstrates normal gait alignment no effusion left knee full range of motion stable collateral crucial ligaments mild patellofemoral crepitus no malalignment patellar maltracking or apprehension  Specialty Comments:  No specialty comments available.  Imaging: No results found.   PMFS History: Patient Active Problem List   Diagnosis Date Noted  . Primary osteoarthritis of left knee 10/08/2016  . Pain in right hand 09/21/2016  . Closed nondisplaced fracture of proximal phalanx of right middle finger 08/24/2016  . Right hip pain 08/24/2016  . S/P laparoscopic appendectomy 10/02/2014  . Left knee pain 07/01/2013  . Morbid obesity ([place]) 01/23/2013  . Left lumbar radiculopathy 01/23/2013  . Heel pain 01/23/2013  . HYPERLIPIDEMIA 09/29/2008  . SLEEP APNEA, OBSTRUCTIVE 09/29/2008  . HEADACHE, SEVERE 09/29/2008   Past Medical History:  Diagnosis Date  . Arthritis   . Back pain, chronic   . EKG abnormality 2007   stress test and cardiac evaluation [facility] cardiology  . PONV (postoperative nausea and vomiting)   . Shortness of breath  with exertion  . Sleep apnea 07  . Vision impairment    wears reading glasses    Family History  Problem Relation Age of Onset  . Heart disease Mother 82       died of heart disease, age 60yo  . Obesity Mother   . Hypertension Mother   . Hyperlipidemia Mother   . Cancer Father        died of stomach and intestinal cancer  . Heart disease Brother        CAD, stent  . Other Brother        substance abuse  . Diabetes Neg Hx   . Stroke Neg Hx   . Colon cancer Neg Hx     Past Surgical History:  Procedure Laterality Date  . APPENDECTOMY    . BACK SURGERY  2014   Micro  disectomy l3-4  . CARDIAC CATHETERIZATION  2007   due to equivocal stress test.  normal cath per pt report  . COLONOSCOPY    . 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 KNEE ARTHROSCOPY Left 2014  . KNEE SURGERY    . LAPAROSCOPIC APPENDECTOMY N/A 10/01/2014   Procedure: APPENDECTOMY LAPAROSCOPIC;  Surgeon: Georganna Skeans, MD;  Location: [facility] OR;  Service: General;  Laterality: N/A;  . WISDOM TOOTH EXTRACTION     age 50yo   Social History   Occupational History  . 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 Smoking status: Former Smoker    Years: 7.00    Types: Cigarettes    Start date: 09/06/1985  . Smokeless tobacco: Never Used     Comment: quit in 1987  . Alcohol use No  . Drug use: No  . Sexual activity: Not on file

## 2017-01-15 ENCOUNTER — Telehealth (INDEPENDENT_AMBULATORY_CARE_PROVIDER_SITE_OTHER): Payer: Self-pay

## 2017-01-15 NOTE — Telephone Encounter (Signed)
Called pharmacy to let them know that insurance denied auth for oxycodone. This was submitted on [place] tracks.

## 2017-01-15 NOTE — Telephone Encounter (Signed)
Patient requested rx for pain medication at his last visit. This was provided to patient. Received notification from pharmacy that needed prior auth. I submitted this online at [facility] tracks. Heard back from them this morning and they denied coverage. [place] pharmacy to let them know

## 2018-05-20 ENCOUNTER — Other Ambulatory Visit: Payer: Self-pay

## 2018-05-20 ENCOUNTER — Ambulatory Visit (AMBULATORY_SURGERY_CENTER): Payer: Medicaid Other | Admitting: *Deleted

## 2018-05-20 VITALS — Ht 74.0 in | Wt 296.0 lb

## 2018-05-20 DIAGNOSIS — Z1211 Encounter for screening for malignant neoplasm of colon: Secondary | ICD-10-CM

## 2018-05-20 MED ORDER — SUPREP BOWEL PREP KIT 17.5-3.13-1.6 GM/177ML PO SOLN: 1.0000 | Freq: Once | ORAL | 0 refills | Status: AC

## 2018-05-20 NOTE — Progress Notes (Signed)
Patient denies any allergies to egg or soy products. Patient denies complications with anesthesia/sedation.  Patient denies oxygen use at home and denies diet medications. Patient does not use CPAP. Patient denies information on Colonoscopy.

## 2018-06-02 ENCOUNTER — Ambulatory Visit (AMBULATORY_SURGERY_CENTER): Payer: Medicaid Other | Admitting: Internal Medicine

## 2018-06-02 ENCOUNTER — Encounter: Payer: Self-pay | Admitting: Internal Medicine

## 2018-06-02 ENCOUNTER — Encounter

## 2018-06-02 VITALS — BP 152/99 | HR 57 | Temp 97.5°F | Resp 11 | Ht 76.0 in | Wt 307.0 lb

## 2018-06-02 DIAGNOSIS — Z1211 Encounter for screening for malignant neoplasm of colon: Secondary | ICD-10-CM

## 2018-06-02 DIAGNOSIS — D122 Benign neoplasm of ascending colon: Secondary | ICD-10-CM

## 2018-06-02 HISTORY — PX: COLONOSCOPY: SHX174

## 2018-06-02 MED ORDER — SODIUM CHLORIDE 0.9 % IV SOLN: 500.0000 mL | Freq: Once | INTRAVENOUS | Status: DC

## 2018-06-02 NOTE — Progress Notes (Signed)
PT taken to PACU. Monitors in place. VSS. Report given to RN. 

## 2018-06-02 NOTE — Patient Instructions (Signed)
YOU HAD AN ENDOSCOPIC PROCEDURE TODAY AT THE [facility]:   Refer to the procedure report that was given to you for any specific questions about what was found during the examination.  If the procedure report does not answer your questions, please call your gastroenterologist to clarify.  If you requested that your care partner not be given the details of your procedure findings, then the procedure report has been included in a sealed envelope for you to review at your convenience later.  YOU SHOULD EXPECT: Some feelings of bloating in the abdomen. Passage of more gas than usual.  Walking can help get rid of the air that was put into your GI tract during the procedure and reduce the bloating. If you had a lower endoscopy (such as a colonoscopy or flexible sigmoidoscopy) you may notice spotting of blood in your stool or on the toilet paper. If you underwent a bowel prep for your procedure, you may not have a normal bowel movement for a few days.  Please Note:  You might notice some irritation and congestion in your nose or some drainage.  This is from the oxygen used during your procedure.  There is no need for concern and it should clear up in a day or so.  SYMPTOMS TO REPORT IMMEDIATELY:   Following lower endoscopy (colonoscopy or flexible sigmoidoscopy):  Excessive amounts of blood in the stool  Significant tenderness or worsening of abdominal pains  Swelling of the abdomen that is new, acute  Fever of 100F or higher   For urgent or emergent issues, a gastroenterologist can be reached at any hour by calling [phone].   DIET:  We do recommend a small meal at first, but then you may proceed to your regular diet.  Drink plenty of fluids but you should avoid alcoholic beverages for 24 hours.  ACTIVITY:  You should plan to take it easy for the rest of today and you should NOT DRIVE or use heavy machinery until tomorrow (because of the sedation medicines used during the test).     FOLLOW UP: Our staff will call the number listed on your records the next business day following your procedure to check on you and address any questions or concerns that you may have regarding the information given to you following your procedure. If we do not reach you, we will leave a message.  However, if you are feeling well and you are not experiencing any problems, there is no need to return our call.  We will assume that you have returned to your regular daily activities without incident.  If any biopsies were taken you will be contacted by phone or by letter within the next 1-3 weeks.  Please call us at [phone] if you have not heard about the biopsies in 3 weeks.    SIGNATURES/CONFIDENTIALITY: You and/or your care partner have signed paperwork which will be entered into your electronic medical record.  These signatures attest to the fact that that the information above on your After Visit Summary has been reviewed and is understood.  Full responsibility of the confidentiality of this discharge information lies with you and/or your care-partner.   Handouts were given to your care partner on polyps, diverticulosis, and hemorrhoids. You may resume your current medications today. Await biopsy results. Please call if any questions or concerns.   

## 2018-06-02 NOTE — Op Note (Signed)
[facility] Patient Name: Nicholas Horn Procedure Date: 06/02/2018 8:42 AM MRN: [id number] Endoscopist: Jerene Bears , MD Age: 57 Referring MD:  Date of Birth: 1961/02/20 Gender: Male Account #: [id number] Procedure:                Colonoscopy Indications:              Screening for colorectal malignant neoplasm, Last                            colonoscopy 5 years ago Medicines:                Propofol per Anesthesia Procedure:                Pre-Anesthesia Assessment:                           - Prior to the procedure, a History and Physical                            was performed, and patient medications and                            allergies were reviewed. The patient's tolerance of                            previous anesthesia was also reviewed. The risks                            and benefits of the procedure and the sedation                            options and risks were discussed with the patient.                            All questions were answered, and informed consent                            was obtained. Prior Anticoagulants: The patient has                            taken no previous anticoagulant or antiplatelet                            agents. ASA Grade Assessment: II - A patient with                            mild systemic disease. After reviewing the risks                            and benefits, the patient was deemed in                            satisfactory condition to undergo the procedure.  After obtaining informed consent, the colonoscope                            was passed under direct vision. Throughout the                            procedure, the patient's blood pressure, pulse, and                            oxygen saturations were monitored continuously. The                            Colonoscope was introduced through the anus and                            advanced to the cecum, identified by  appendiceal                            orifice and ileocecal valve. The colonoscopy was                            performed without difficulty. The patient tolerated                            the procedure well. The quality of the bowel                            preparation was good. The ileocecal valve,                            appendiceal orifice, and rectum were photographed.                            The bowel preparation used was 2 day SUPREP. Scope In: 8:47:36 AM Scope Out: 9:01:32 AM Scope Withdrawal Time: 0 hours 11 minutes 26 seconds  Total Procedure Duration: 0 hours 13 minutes 56 seconds  Findings:                 The digital rectal exam was normal.                           A 4 mm polyp was found in the ascending colon. The                            polyp was sessile. The polyp was removed with a                            cold snare. Resection and retrieval were complete.                           A few small-mouthed diverticula were found in the                            sigmoid colon.  Internal hemorrhoids were found during                            retroflexion. The hemorrhoids were small. Complications:            No immediate complications. Estimated Blood Loss:     Estimated blood loss was minimal. Impression:               - One 4 mm polyp in the ascending colon, removed                            with a cold snare. Resected and retrieved.                           - Diverticulosis in the sigmoid colon.                           - Small internal hemorrhoids. Recommendation:           - Patient has a contact number available for                            emergencies. The signs and symptoms of potential                            delayed complications were discussed with the                            patient. Return to normal activities tomorrow.                            Written discharge instructions were provided to the                             patient.                           - Continue present medications.                           - Await pathology results.                           - Repeat colonoscopy is recommended for                            surveillance. The colonoscopy date will be                            determined after pathology results from today's                            exam become available for review. Jerene Bears, MD 06/02/2018 9:12:07 AM This report has been signed electronically.

## 2018-06-02 NOTE — Progress Notes (Signed)
Called to room to assist during endoscopic procedure.  Patient ID and intended procedure confirmed with present staff. Received instructions for my participation in the procedure from the performing physician.  

## 2018-06-02 NOTE — Progress Notes (Signed)
No problems noted in the recovery room. maw 

## 2018-06-03 ENCOUNTER — Telehealth: Payer: Self-pay

## 2018-06-03 NOTE — Telephone Encounter (Signed)
  Follow up Call-  Call back number 06/02/2018  Post procedure Call Back phone  # [phone]  Some recent data might be hidden     Patient questions:  Do you have a fever, pain , or abdominal swelling? No. Pain Score  0 *  Have you tolerated food without any problems? Yes.    Have you been able to return to your normal activities? Yes.    Do you have any questions about your discharge instructions: Diet   No. Medications  No. Follow up visit  No.  Do you have questions or concerns about your Care? No.  Actions: * If pain score is 4 or above: No action needed, pain <4.

## 2018-06-09 ENCOUNTER — Encounter: Payer: Self-pay | Admitting: Internal Medicine

## 2019-09-02 ENCOUNTER — Ambulatory Visit (INDEPENDENT_AMBULATORY_CARE_PROVIDER_SITE_OTHER): Payer: Medicaid Other

## 2019-09-02 ENCOUNTER — Other Ambulatory Visit: Payer: Self-pay

## 2019-09-02 ENCOUNTER — Encounter: Payer: Self-pay | Admitting: Orthopedic Surgery

## 2019-09-02 ENCOUNTER — Ambulatory Visit: Payer: Medicaid Other | Admitting: Orthopedic Surgery

## 2019-09-02 VITALS — Ht 76.0 in | Wt 290.0 lb

## 2019-09-02 DIAGNOSIS — M25562 Pain in left knee: Secondary | ICD-10-CM

## 2019-09-02 DIAGNOSIS — M79672 Pain in left foot: Secondary | ICD-10-CM

## 2019-09-02 DIAGNOSIS — G8929 Other chronic pain: Secondary | ICD-10-CM

## 2019-09-02 DIAGNOSIS — M1712 Unilateral primary osteoarthritis, left knee: Secondary | ICD-10-CM

## 2019-09-02 MED ORDER — OXYCODONE-ACETAMINOPHEN 5-325 MG PO TABS: 1.0000 | ORAL_TABLET | Freq: Three times a day (TID) | ORAL | 0 refills | Status: DC | PRN

## 2019-09-06 ENCOUNTER — Encounter: Payer: Self-pay | Admitting: Orthopedic Surgery

## 2019-09-06 DIAGNOSIS — M1712 Unilateral primary osteoarthritis, left knee: Secondary | ICD-10-CM

## 2019-09-06 MED ORDER — LIDOCAINE HCL 1 % IJ SOLN
5.0000 mL | INTRAMUSCULAR | Status: AC | PRN
  Administered 2019-09-06: 13:00:00 5 mL

## 2019-09-06 MED ORDER — BUPIVACAINE HCL 0.25 % IJ SOLN
4.0000 mL | INTRAMUSCULAR | Status: AC | PRN
  Administered 2019-09-06: 13:00:00 4 mL via INTRA_ARTICULAR

## 2019-09-06 MED ORDER — METHYLPREDNISOLONE ACETATE 40 MG/ML IJ SUSP
40.0000 mg | INTRAMUSCULAR | Status: AC | PRN
  Administered 2019-09-06: 13:00:00 40 mg via INTRA_ARTICULAR

## 2019-09-06 NOTE — Progress Notes (Signed)
Office Visit Note   Patient: Nicholas Horn           Date of Birth: [birth year]           MRN: [id number] Visit Date: 09/02/2019 Requested by: Nicholas Horn, Nicholas R, DO No address on file PCP: Nicholas Abe, DO  Subjective: Chief Complaint  Patient presents with  . Left Knee - Pain  . Left Foot - Pain    HPI: Nicholas Horn is a patient with left foot and left knee pain.  Had surgery on the right foot for retrocalcaneal bursitis.  Now he states that the left foot feels the same.  He has been trying to lose weight.  He also describes left knee pain.  He thinks it may be from favoring the left foot.  Describes popping and pain.  Patient raises large to button mastiff's.  Knee gives out at times when he is doing that.  Takes Cymbalta Robaxin and Mobic.  Has a history of left knee arthroscopy.  The heel pain he has on the left-hand side is worse with standing.              ROS: All systems reviewed are negative as they relate to the chief complaint within the history of present illness.  Patient denies  fevers or chills.   Assessment & Plan: Visit Diagnoses:  1. Chronic pain of left knee   2. Pain in left foot   3. Primary osteoarthritis of left knee     Plan: Impression is left foot pain which could be retrocalcaneal bursitis.  I think heelcord stretching and topical Voltaren are indicated for that..  Regarding the knee I think a left knee injection is indicated.  Percocet refill x1 but he will have to get that from pain management or his primary care physician in the future.  We will see him back as needed  Follow-Up Instructions: No follow-ups on file.   Orders:  Orders Placed This Encounter  Procedures  . XR KNEE 3 VIEW LEFT  . XR Foot Complete Left   Meds ordered this encounter  Medications  . oxyCODONE-acetaminophen (PERCOCET/ROXICET) 5-325 MG tablet    Sig: Take 1 tablet by mouth every 8 (eight) hours as needed for severe pain.    Dispense:  30 tablet    Refill:  0      Procedures: Large Joint Inj: L knee on 09/06/2019 12:33 PM Indications: diagnostic evaluation, joint swelling and pain Details: 18 G 1.5 in needle, superolateral approach  Arthrogram: No  Medications: 5 mL lidocaine 1 %; 40 mg methylPREDNISolone acetate 40 MG/ML; 4 mL bupivacaine 0.25 % Outcome: tolerated well, no immediate complications Procedure, treatment alternatives, risks and benefits explained, specific risks discussed. Consent was given by the patient. Immediately prior to procedure a time out was called to verify the correct patient, procedure, equipment, support staff and site/side marked as required. Patient was prepped and draped in the usual sterile fashion.       Clinical Data: No additional findings.  Objective: Vital Signs: Ht 6\' 4"  (1.93 m)   Wt 290 lb (131.5 kg)   BMI 35.30 kg/m   Physical Exam:   Constitutional: Patient appears well-developed HEENT:  Head: Normocephalic Eyes:EOM are normal Neck: Normal range of motion Cardiovascular: Normal rate Pulmonary/chest: Effort normal Neurologic: Patient is alert Skin: Skin is warm Psychiatric: Patient has normal mood and affect    Ortho Exam: Ortho exam demonstrates medial and lateral joint line tenderness on the left-hand side.  Extensor mechanism is intact and the left collateral cruciate ligaments are stable.  Patient does have some medial joint line tenderness.  Has tenderness to palpation posterior aspect of the calcaneus.  No plantar fascia tenderness.  Pedal pulses palpable.  Patient has good ankle range of motion and stable subtalar joint.  No other masses lymphadenopathy or skin changes noted in that left knee or left foot region.  Specialty Comments:  No specialty comments available.  Imaging: No results found.   PMFS History: Patient Active Problem List   Diagnosis Date Noted  . Primary osteoarthritis of left knee 10/08/2016  . Pain in right hand 09/21/2016  . Closed nondisplaced fracture of  proximal phalanx of right middle finger 08/24/2016  . Right hip pain 08/24/2016  . S/P laparoscopic appendectomy 10/02/2014  . Left knee pain 07/01/2013  . Morbid obesity ([place]) 01/23/2013  . Left lumbar radiculopathy 01/23/2013  . Heel pain 01/23/2013  . HYPERLIPIDEMIA 09/29/2008  . SLEEP APNEA, OBSTRUCTIVE 09/29/2008  . HEADACHE, SEVERE 09/29/2008   Past Medical History:  Diagnosis Date  . Arthritis    spine  . Back pain, chronic   . EKG abnormality 2007   stress test and cardiac evaluation [facility] cardiology  . Hyperlipidemia    no meds, diet controlled  . Kidney stones    passed stones  . Shortness of breath    with exertion  . Sleep apnea 07   Does not use CPAP  . Vision impairment    wears reading glasses    Family History  Problem Relation Age of Onset  . Heart disease Mother 29       died of heart disease, age 61yo  . Obesity Mother   . Hypertension Mother   . Hyperlipidemia Mother   . Cancer Father        died of stomach and intestinal cancer  . Heart disease Brother        CAD, stent  . Other Brother        substance abuse  . Diabetes Neg Hx   . Stroke Neg Hx   . Colon cancer Neg Hx     Past Surgical History:  Procedure Laterality Date  . APPENDECTOMY    . BACK SURGERY  2014   Micro disectomy L3-4  . CARDIAC CATHETERIZATION  2007   due to equivocal stress test.  normal cath per pt report  . COLONOSCOPY  10/2012  . 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 KNEE ARTHROSCOPY Left 2014  . KNEE SURGERY    . LAPAROSCOPIC APPENDECTOMY N/A 10/01/2014   Procedure: APPENDECTOMY LAPAROSCOPIC;  Surgeon: Nicholas Skeans, MD;  Location: [facility];  Service: General;  Laterality: N/A;  . LITHOTRIPSY     kidney stone   . WISDOM TOOTH EXTRACTION     age 75yo    Social History   Occupational History  . Occupation: Doctor, hospital: [employer]  Tobacco Use  . Smoking status: Former Smoker    Years: 7.00    Types: Cigarettes    Start date: 09/06/1985  . Smokeless tobacco: Never Used  . Tobacco comment: quit in 1987  Substance and Sexual Activity  . Alcohol use: No  . Drug use: No  . Sexual activity: Not Currently

## 2019-09-23 ENCOUNTER — Other Ambulatory Visit: Payer: Self-pay

## 2019-09-23 ENCOUNTER — Ambulatory Visit: Payer: Medicaid Other | Admitting: Orthopedic Surgery

## 2019-09-23 DIAGNOSIS — M1712 Unilateral primary osteoarthritis, left knee: Secondary | ICD-10-CM

## 2019-09-24 ENCOUNTER — Encounter: Payer: Self-pay | Admitting: Orthopedic Surgery

## 2019-09-24 NOTE — Progress Notes (Signed)
Office Visit Note   Patient: Nicholas Horn           Date of Birth: 08/03/1961           MRN: [id number] Visit Date: 09/23/2019 Requested by: Shawna Orleans, Doe-Hyun R, DO No address on file PCP: Rosine Abe, DO  Subjective: Chief Complaint  Patient presents with  . Left Knee - Pain    HPI: Nicholas Horn is a patient with left knee pain.  He did have an injection cortisone 09/02/2019 which helped him until he felt a pop trying to put scans in a gas generator.  He was not doing too much in terms of activity but did feel a pop on the medial aspect of his left knee and has had pain and swelling since that time.  He is only able to stand about 15 to 20 minutes.  He did have an MRI scan in 2018 which showed medial compartment arthritis but the meniscus was intact.  Plain radiographs reviewed also show essentially end-stage medial compartment osteoarthritis with mild varus alignment.              ROS: All systems reviewed are negative as they relate to the chief complaint within the history of present illness.  Patient denies  fevers or chills.   Assessment & Plan: Visit Diagnoses:  1. Primary osteoarthritis of left knee     Plan: Impression is pop and mild swelling in left knee with known medial compartment osteoarthritis.  I think he is likely either torn his meniscus peripherally or has a meniscal root tear.  Does have diminished standing ability.  I will try him with a medial unloader brace from [employer].  I think he is heading for partial versus complete knee replacement at sometime in the future.  Follow-up with me as needed.  Continue to do nonweightbearing quad strengthening exercises.  Follow-Up Instructions: Return if symptoms worsen or fail to improve.   Orders:  No orders of the defined types were placed in this encounter.  No orders of the defined types were placed in this encounter.     Procedures: No procedures performed   Clinical Data: No additional  findings.  Objective: Vital Signs: There were no vitals taken for this visit.  Physical Exam:   Constitutional: Patient appears well-developed HEENT:  Head: Normocephalic Eyes:EOM are normal Neck: Normal range of motion Cardiovascular: Normal rate Pulmonary/chest: Effort normal Neurologic: Patient is alert Skin: Skin is warm Psychiatric: Patient has normal mood and affect    Ortho Exam: Ortho exam demonstrates full active and passive range of motion of the left knee with mild effusion.  Collateral and cruciate ligaments are stable.  Extensor mechanism is intact.  No masses lymphadenopathy or skin changes noted in that left knee region.  Does have medial greater than lateral joint line tenderness but does have some laxity to valgus stress at 0 and 30 degrees of flexion.  Specialty Comments:  No specialty comments available.  Imaging: No results found.   PMFS History: Patient Active Problem List   Diagnosis Date Noted  . Primary osteoarthritis of left knee 10/08/2016  . Pain in right hand 09/21/2016  . Closed nondisplaced fracture of proximal phalanx of right middle finger 08/24/2016  . Right hip pain 08/24/2016  . S/P laparoscopic appendectomy 10/02/2014  . Left knee pain 07/01/2013  . Morbid obesity ([place]) 01/23/2013  . Left lumbar radiculopathy 01/23/2013  . Heel pain 01/23/2013  . HYPERLIPIDEMIA 09/29/2008  . SLEEP APNEA, OBSTRUCTIVE 09/29/2008  .  HEADACHE, SEVERE 09/29/2008   Past Medical History:  Diagnosis Date  . Arthritis    spine  . Back pain, chronic   . EKG abnormality 2007   stress test and cardiac evaluation [facility] cardiology  . Hyperlipidemia    no meds, diet controlled  . Kidney stones    passed stones  . Shortness of breath    with exertion  . Sleep apnea 07   Does not use CPAP  . Vision impairment    wears reading glasses    Family History  Problem Relation Age of Onset  . Heart disease Mother 72       died of heart disease, age 30yo   . Obesity Mother   . Hypertension Mother   . Hyperlipidemia Mother   . Cancer Father        died of stomach and intestinal cancer  . Heart disease Brother        CAD, stent  . Other Brother        substance abuse  . Diabetes Neg Hx   . Stroke Neg Hx   . Colon cancer Neg Hx     Past Surgical History:  Procedure Laterality Date  . APPENDECTOMY    . BACK SURGERY  2014   Micro disectomy L3-4  . CARDIAC CATHETERIZATION  2007   due to equivocal stress test.  normal cath per pt report  . COLONOSCOPY  10/2012  . 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 KNEE ARTHROSCOPY Left 2014  . KNEE SURGERY    . LAPAROSCOPIC APPENDECTOMY N/A 10/01/2014   Procedure: APPENDECTOMY LAPAROSCOPIC;  Surgeon: Georganna Skeans, MD;  Location: [facility];  Service: General;  Laterality: N/A;  . LITHOTRIPSY     kidney stone   . WISDOM TOOTH EXTRACTION     age 73yo   Social History   Occupational History  . 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 Smoking status: Former Smoker    Years: 7.00    Types: Cigarettes    Start date: 09/06/1985  . Smokeless tobacco: Never Used  . Tobacco comment: quit in 1987  Substance and Sexual Activity  . Alcohol use: No  . Drug use: No  . Sexual activity: Not Currently

## 2019-09-29 ENCOUNTER — Telehealth: Payer: Self-pay | Admitting: Orthopedic Surgery

## 2019-09-29 NOTE — Telephone Encounter (Signed)
I called patient, no answer. LMVM advised I had emailed rep with [employer] last Wednesday. Also advised I sent follow up email to [facility] with [employer] to check status.

## 2019-09-29 NOTE — Telephone Encounter (Signed)
Patient called requesting a call back. Patient states he was told he would be measured for a brace for left knee. Please call patient back for this matter. Patient phone number is [phone].

## 2019-10-13 ENCOUNTER — Telehealth: Payer: Self-pay | Admitting: Orthopedic Surgery

## 2019-10-13 NOTE — Telephone Encounter (Signed)
Patient called requesting updates on brace. Patient was informed Mrs. Lauren left a [employer] account executive. Patient needed update. Patient requesting another call back. Patient phone number is [phone].

## 2019-10-13 NOTE — Telephone Encounter (Signed)
Holding for you.  

## 2019-10-14 NOTE — Telephone Encounter (Signed)
Patient has been delayed in getting off-loader brace. He said he is out of pain medication and is to go out of town at the end of this week. Is asking for a refill.

## 2019-10-15 ENCOUNTER — Other Ambulatory Visit: Payer: Self-pay | Admitting: Surgical

## 2019-10-15 MED ORDER — TRAMADOL HCL 50 MG PO TABS: 50.0000 mg | ORAL_TABLET | Freq: Every day | ORAL | 0 refills | Status: DC | PRN

## 2019-10-15 NOTE — Telephone Encounter (Signed)
Advised pt

## 2019-10-15 NOTE — Telephone Encounter (Signed)
I called s/w patient and advised. Verbalized understanding. He would like to get rx for ultram sent to [facility]

## 2019-10-15 NOTE — Telephone Encounter (Signed)
Per Dr. Randel Pigg note on 1/27, he should reach out to his PCP or pain management if he wants a refill of his Percocet. I can do a one-time prescription of tramadol, but after that he needs pain management for continued pain medicine

## 2020-07-06 DIAGNOSIS — Z8616 Personal history of COVID-19: Secondary | ICD-10-CM

## 2020-07-06 HISTORY — DX: Personal history of COVID-19: Z86.16

## 2020-07-23 ENCOUNTER — Other Ambulatory Visit: Payer: Self-pay

## 2020-07-23 ENCOUNTER — Emergency Department (HOSPITAL_COMMUNITY)
Admission: EM | Admit: 2020-07-23 | Discharge: 2020-07-24 | Disposition: A | Discharge status: Home / Self Care | Payer: Medicaid Other | Attending: Emergency Medicine | Admitting: Emergency Medicine

## 2020-07-23 ENCOUNTER — Encounter (HOSPITAL_COMMUNITY): Payer: Self-pay | Admitting: Emergency Medicine

## 2020-07-23 ENCOUNTER — Emergency Department (HOSPITAL_COMMUNITY): Payer: Medicaid Other

## 2020-07-23 DIAGNOSIS — Z9861 Coronary angioplasty status: Secondary | ICD-10-CM | POA: Diagnosis not present

## 2020-07-23 DIAGNOSIS — U071 COVID-19: Secondary | ICD-10-CM | POA: Diagnosis not present

## 2020-07-23 DIAGNOSIS — R110 Nausea: Secondary | ICD-10-CM | POA: Diagnosis not present

## 2020-07-23 DIAGNOSIS — Z87891 Personal history of nicotine dependence: Secondary | ICD-10-CM | POA: Insufficient documentation

## 2020-07-23 DIAGNOSIS — R509 Fever, unspecified: Secondary | ICD-10-CM | POA: Diagnosis present

## 2020-07-23 LAB — CBC WITH DIFFERENTIAL/PLATELET
Abs Immature Granulocytes: 0 10*3/uL (ref 0.00–0.07)
Basophils Absolute: 0 10*3/uL (ref 0.0–0.1)
Basophils Relative: 0 %
Eosinophils Absolute: 0 10*3/uL (ref 0.0–0.5)
Eosinophils Relative: 1 %
HCT: 46.6 % (ref 39.0–52.0)
Hemoglobin: 15.6 g/dL (ref 13.0–17.0)
Immature Granulocytes: 0 %
Lymphocytes Relative: 25 %
Lymphs Abs: 1.3 10*3/uL (ref 0.7–4.0)
MCH: 31.2 pg (ref 26.0–34.0)
MCHC: 33.5 g/dL (ref 30.0–36.0)
MCV: 93.2 fL (ref 80.0–100.0)
Monocytes Absolute: 0.3 10*3/uL (ref 0.1–1.0)
Monocytes Relative: 7 %
Neutro Abs: 3.3 10*3/uL (ref 1.7–7.7)
Neutrophils Relative %: 67 %
Platelets: 131 10*3/uL — ABNORMAL LOW (ref 150–400)
RBC: 5 MIL/uL (ref 4.22–5.81)
RDW: 12 % (ref 11.5–15.5)
WBC: 5 10*3/uL (ref 4.0–10.5)
nRBC: 0 % (ref 0.0–0.2)

## 2020-07-23 LAB — COMPREHENSIVE METABOLIC PANEL
ALT: 33 U/L (ref 0–44)
AST: 37 U/L (ref 15–41)
Albumin: 3.9 g/dL (ref 3.5–5.0)
Alkaline Phosphatase: 60 U/L (ref 38–126)
Anion gap: 12 (ref 5–15)
BUN: 8 mg/dL (ref 6–20)
CO2: 25 mmol/L (ref 22–32)
Calcium: 9.1 mg/dL (ref 8.9–10.3)
Chloride: 100 mmol/L (ref 98–111)
Creatinine, Ser: 1.2 mg/dL (ref 0.61–1.24)
GFR, Estimated: >60 mL/min (ref >60)
Glucose, Bld: 124 mg/dL — ABNORMAL HIGH (ref 70–99)
Potassium: 4.1 mmol/L (ref 3.5–5.1)
Sodium: 137 mmol/L (ref 135–145)
Total Bilirubin: 0.9 mg/dL (ref 0.3–1.2)
Total Protein: 7.4 g/dL (ref 6.5–8.1)

## 2020-07-23 LAB — RESP PANEL BY RT-PCR (FLU A&B, COVID) ARPGX2
Influenza A by PCR: NEGATIVE
Influenza B by PCR: NEGATIVE
SARS Coronavirus 2 by RT PCR: POSITIVE — AB

## 2020-07-23 LAB — LACTIC ACID, PLASMA: Lactic Acid, Venous: 1.3 mmol/L (ref 0.5–1.9)

## 2020-07-23 MED ORDER — ACETAMINOPHEN 325 MG PO TABS
650.0000 mg | ORAL_TABLET | Freq: Once | ORAL | Status: AC | PRN
  Administered 2020-07-23: 650 mg via ORAL
  Filled 2020-07-23: qty 2

## 2020-07-23 NOTE — ED Triage Notes (Signed)
Pt reports fever, headache, body aches, diarrhea, SOB, nausea, loss of taste/smell, and dizziness since Monday.  Hasn't had COVID vaccines.

## 2020-07-24 MED ORDER — ACETAMINOPHEN 500 MG PO TABS
1000.0000 mg | ORAL_TABLET | Freq: Once | ORAL | Status: AC
  Administered 2020-07-24: 1000 mg via ORAL
  Filled 2020-07-24: qty 2

## 2020-07-24 MED ORDER — SODIUM CHLORIDE 0.9 % IV BOLUS
500.0000 mL | Freq: Once | INTRAVENOUS | Status: AC
  Administered 2020-07-24: 500 mL via INTRAVENOUS

## 2020-07-24 MED ORDER — METOCLOPRAMIDE HCL 5 MG/ML IJ SOLN
10.0000 mg | INTRAMUSCULAR | Status: AC
  Administered 2020-07-24: 10 mg via INTRAVENOUS
  Filled 2020-07-24: qty 2

## 2020-07-24 NOTE — ED Notes (Signed)
Pt ambulated around the room and sp02 maintain 97-98% on RA. Pt reports no sob with ambulation.

## 2020-07-24 NOTE — ED Provider Notes (Signed)
[place] EMERGENCY DEPARTMENT Provider Note   CSN: [id number] Arrival date & time: 07/23/20  1710     History Chief Complaint  Patient presents with  . COVID TESTING  . Fever    Nicholas Horn is a 59 y.o. male.  59 year old male with a history of hyperlipidemia, sleep apnea, morbid obesity presents to the emergency department for symptoms consistent with COVID-19.  Onset of symptoms was Monday afternoon.  Began experiencing body aches as well as subjective fever, chills.  Took NyQuil for symptoms and woke from sleep drenched in sweat, as though his fever had broken.  He continues to experience recurrent chills typically in the afternoon.  Endorses accompanying myalgias, nausea, loss of taste and smell.  Has felt progressively short of breath with exertion.  No syncope, vomiting.  Symptoms unrelieved with D3, Vitamin C, Zinc.  Recently traveled to [place] for work.  Has not been vaccinated for COVID.  The history is provided by the patient. No language interpreter was used.  Fever      Past Medical History:  Diagnosis Date  . Arthritis    spine  . Back pain, chronic   . EKG abnormality 2007   stress test and cardiac evaluation [facility] [place] cardiology  . Hyperlipidemia    no meds, diet controlled  . Kidney stones    passed stones  . Shortness of breath    with exertion  . Sleep apnea 07   Does not use CPAP  . Vision impairment    wears reading glasses    Patient Active Problem List   Diagnosis Date Noted  . Primary osteoarthritis of left knee 10/08/2016  . Pain in right hand 09/21/2016  . Closed nondisplaced fracture of proximal phalanx of right middle finger 08/24/2016  . Right hip pain 08/24/2016  . S/P laparoscopic appendectomy 10/02/2014  . Left knee pain 07/01/2013  . Morbid obesity ([place]) 01/23/2013  . Left lumbar radiculopathy 01/23/2013  . Heel pain 01/23/2013  . HYPERLIPIDEMIA 09/29/2008  . SLEEP APNEA, OBSTRUCTIVE 09/29/2008  .  HEADACHE, SEVERE 09/29/2008    Past Surgical History:  Procedure Laterality Date  . APPENDECTOMY    . BACK SURGERY  2014   Micro disectomy L3-4  . CARDIAC CATHETERIZATION  2007   due to equivocal stress test.  normal cath per pt report  . COLONOSCOPY  10/2012  . 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 KNEE ARTHROSCOPY Left 2014  . KNEE SURGERY    . LAPAROSCOPIC APPENDECTOMY N/A 10/01/2014   Procedure: APPENDECTOMY LAPAROSCOPIC;  Surgeon: Georganna Skeans, MD;  Location: [place];  Service: General;  Laterality: N/A;  . LITHOTRIPSY     kidney stone   . WISDOM TOOTH EXTRACTION     age 61yo       Family History  Problem Relation Age of Onset  . Heart disease Mother 48       died of heart disease, age 8yo  . Obesity Mother   . Hypertension Mother   . Hyperlipidemia Mother   . Cancer Father        died of stomach and intestinal cancer  . Heart disease Brother        CAD, stent  . Other Brother        substance abuse  . Diabetes Neg  Hx   . Stroke Neg Hx   . Colon cancer Neg Hx     Social History   Tobacco Use  . Smoking status: Former Smoker    Years: 7.00    Types: Cigarettes    Start date: 09/06/1985  . Smokeless tobacco: Never Used  . Tobacco comment: quit in 1987  Substance Use Topics  . Alcohol use: No  . Drug use: No    Home Medications Prior to Admission medications   Medication Sig Start Date End Date Taking? Authorizing Provider  meloxicam (MOBIC) 15 MG tablet Take 15 mg by mouth daily.    [provider]  methocarbamol (ROBAXIN) 500 MG tablet Take 500 mg by mouth every 8 (eight) hours as needed for muscle spasms.    [provider]  oxyCODONE-acetaminophen (PERCOCET) 10-325 MG tablet Take 1 tablet by mouth every 4 (four) hours as  needed for pain.    [provider]  oxyCODONE-acetaminophen (PERCOCET/ROXICET) 5-325 MG tablet Take 1 tablet by mouth every 8 (eight) hours as needed for severe pain. 09/02/19   Meredith Pel, MD  traMADol (ULTRAM) 50 MG tablet Take 1 tablet (50 mg total) by mouth daily as needed. 10/15/19   Magnant, Gerrianne Scale, PA-C    Allergies    Honey  Review of Systems   Review of Systems  Constitutional: Positive for fever.  Ten systems reviewed and are negative for acute change, except as noted in the HPI.    Physical Exam Updated Vital Signs BP (!) 144/86   Pulse 73   Temp 99 F (37.2 C) (Oral)   Resp 18   SpO2 93%   Physical Exam Vitals and nursing note reviewed.  Constitutional:      General: He is not in acute distress.    Appearance: He is well-developed and well-nourished. He is not diaphoretic.     Comments: Appears ill, but nontoxic.  HENT:     Head: Normocephalic and atraumatic.     Nose: Congestion present.  Eyes:     General: No scleral icterus.    Extraocular Movements: EOM normal.     Conjunctiva/sclera: Conjunctivae normal.  Cardiovascular:     Rate and Rhythm: Normal rate and regular rhythm.     Pulses: Normal pulses.  Pulmonary:     Effort: Pulmonary effort is normal. No respiratory distress.     Comments: Respirations even and unlabored. Sporadic dry cough. Musculoskeletal:        General: Normal range of motion.     Cervical back: Normal range of motion.  Skin:    General: Skin is warm and dry.     Coloration: Skin is not pale.     Findings: No erythema or rash.  Neurological:     Mental Status: He is alert and oriented to person, place, and time.     Coordination: Coordination normal.  Psychiatric:        Mood and Affect: Mood and affect normal.        Behavior: Behavior normal.     ED Results / Procedures / Treatments   Labs (all labs ordered are listed, but only abnormal results are displayed) Labs Reviewed  RESP PANEL BY RT-PCR  (FLU A&B, COVID) ARPGX2 - Abnormal; Notable for the following components:      Result Value   SARS Coronavirus 2 by RT PCR POSITIVE (*)    All other components within normal limits  COMPREHENSIVE METABOLIC PANEL - Abnormal; Notable for the following components:  Glucose, Bld 124 (*)    All other components within normal limits  CBC WITH DIFFERENTIAL/PLATELET - Abnormal; Notable for the following components:   Platelets 131 (*)    All other components within normal limits  LACTIC ACID, PLASMA  LACTIC ACID, PLASMA    EKG None  Radiology DG Chest Portable 1 View  Result Date: 07/23/2020 CLINICAL DATA:  Shortness of breath EXAM: PORTABLE CHEST 1 VIEW COMPARISON:  October 10, 2016 FINDINGS: The cardiomediastinal silhouette is unchanged in contour. No pleural effusion. No pneumothorax. No acute pleuroparenchymal abnormality. Visualized abdomen is unremarkable. Mild degenerative changes of the thoracic spine. IMPRESSION: No acute cardiopulmonary abnormality. Electronically Signed   By: Valentino Saxon MD   On: 07/23/2020 18:12    Procedures Procedures (including critical care time)  Medications Ordered in ED Medications  acetaminophen (TYLENOL) tablet 650 mg (650 mg Oral Given 07/23/20 1746)  sodium chloride 0.9 % bolus 500 mL (0 mLs Intravenous Stopped 07/24/20 0512)  acetaminophen (TYLENOL) tablet 1,000 mg (1,000 mg Oral Given 07/24/20 0357)  metoCLOPramide (REGLAN) injection 10 mg (10 mg Intravenous Given 07/24/20 0357)    ED Course  I have reviewed the triage vital signs and the nursing notes.  Pertinent labs & imaging results that were available during my care of the patient were reviewed by me and considered in my medical decision making (see chart for details).  Clinical Course as of 07/24/20 0545  Sun Jul 24, 2020  0534 Per RN, patient ambulated in the room maintain sp02 of 96-98%. Reports no sob during ambulation. [KH]  [id number] Patient feeling better. States headache has  resolved. Comfortable with plan for d/c. [KH]    Clinical Course User Index [KH] Antonietta Breach, PA-C   MDM Rules/Calculators/A&P                          59 year old male presenting to the ED for subjective fever, chills, myalgias, SOB.  Tested positive for Covid in the ED.  He is unvaccinated.  Vital signs are stable and fever responding to antipyretics.  No hypoxia at rest or while ambulating.  Discussed need for 10-day quarantine as well as outpatient supportive management.  Encouraged to return for new or worsening symptoms.  Inquiry sent to [facility] infusion clinic.  Discharged in stable condition  Nicholas Horn was evaluated in Emergency Department on 07/24/2020 for the symptoms described in the history of present illness. He was evaluated in the context of the global COVID-19 pandemic, which necessitated consideration that the patient might be at risk for infection with the SARS-CoV-2 virus that causes COVID-19. Institutional protocols and algorithms that pertain to the evaluation of patients at risk for COVID-19 are in a state of rapid change based on information released by regulatory bodies including the CDC and federal and state organizations. These policies and algorithms were followed during the patient's care in the ED.   Final Clinical Impression(s) / ED Diagnoses Final diagnoses:  COVID-19 virus infection    Rx / DC Orders ED Discharge Orders    None       Antonietta Breach, PA-C 07/24/20 0547    Maudie Flakes, MD 07/24/20 [phone]

## 2020-07-24 NOTE — Discharge Instructions (Addendum)
Quarantine for a total of 10 days. Take tylenol for fever, headaches, body aches. Drink plenty of fluids to prevent dehydration. You may continue to use other over-the-counter remedies for symptom control, if desired. Return for new or concerning symptoms such as worsening shortness of breath, coughing up blood, persistent vomiting, loss of consciousness.

## 2020-07-25 ENCOUNTER — Ambulatory Visit (HOSPITAL_COMMUNITY)
Admission: RE | Admit: 2020-07-25 | Discharge: 2020-07-25 | Disposition: A | Discharge status: Home / Self Care | Payer: Medicaid Other | Source: Ambulatory Visit | Attending: Pulmonary Disease | Admitting: Pulmonary Disease

## 2020-07-25 ENCOUNTER — Encounter: Payer: Self-pay | Admitting: Physician Assistant

## 2020-07-25 ENCOUNTER — Other Ambulatory Visit (HOSPITAL_COMMUNITY): Payer: Self-pay | Admitting: Physician Assistant

## 2020-07-25 DIAGNOSIS — Z283 Underimmunization status: Secondary | ICD-10-CM | POA: Diagnosis not present

## 2020-07-25 DIAGNOSIS — U071 COVID-19: Secondary | ICD-10-CM | POA: Diagnosis present

## 2020-07-25 DIAGNOSIS — G4730 Sleep apnea, unspecified: Secondary | ICD-10-CM | POA: Diagnosis not present

## 2020-07-25 DIAGNOSIS — Z6825 Body mass index (BMI) 25.0-25.9, adult: Secondary | ICD-10-CM | POA: Insufficient documentation

## 2020-07-25 MED ORDER — DIPHENHYDRAMINE HCL 50 MG/ML IJ SOLN: 50.0000 mg | Freq: Once | INTRAMUSCULAR | Status: DC | PRN

## 2020-07-25 MED ORDER — METHYLPREDNISOLONE SODIUM SUCC 125 MG IJ SOLR
125.0000 mg | Freq: Once | INTRAMUSCULAR | Status: DC | PRN
Start: 2020-07-25 — End: 2020-07-26

## 2020-07-25 MED ORDER — SODIUM CHLORIDE 0.9 % IV SOLN
INTRAVENOUS | Status: DC | PRN
Start: 2020-07-25 — End: 2020-07-26

## 2020-07-25 MED ORDER — ALBUTEROL SULFATE HFA 108 (90 BASE) MCG/ACT IN AERS: 2.0000 | INHALATION_SPRAY | Freq: Once | RESPIRATORY_TRACT | Status: DC | PRN

## 2020-07-25 MED ORDER — EPINEPHRINE 0.3 MG/0.3ML IJ SOAJ: 0.3000 mg | Freq: Once | INTRAMUSCULAR | Status: DC | PRN

## 2020-07-25 MED ORDER — SODIUM CHLORIDE 0.9 % IV SOLN: Freq: Once | INTRAVENOUS | Status: AC

## 2020-07-25 MED ORDER — FAMOTIDINE IN NACL 20-0.9 MG/50ML-% IV SOLN: 20.0000 mg | Freq: Once | INTRAVENOUS | Status: DC | PRN

## 2020-07-25 NOTE — Progress Notes (Signed)
Patient reviewed Fact Sheet for Patients, Parents, and Caregivers for Emergency Use Authorization (EUA) of bamlanivimab and etesevimab for the Treatment of Coronavirus. Patient also reviewed and is agreeable to the estimated cost of treatment. Patient is agreeable to proceed.   

## 2020-07-25 NOTE — Progress Notes (Signed)
  Diagnosis: COVID-19  Physician:Dr. Patrick Wright  Procedure: Cov[facility] Infusion Clinic Med: bamlanivimab\etesevimab infusion - Provided patient with bamlanimivab\etesevimab fact sheet for patients, parents and caregivers prior to infusion.  Complications: No immediate complications noted.  Discharge: Discharged home   Nicholas Horn 07/25/2020   

## 2020-07-25 NOTE — Progress Notes (Signed)
I connected by phone with Nicholas Horn on 07/25/2020 at 12:25 PM to discuss the potential use of a new treatment for mild to moderate COVID-19 viral infection in non-hospitalized patients.  This patient is a 59 y.o. male that meets the FDA criteria for Emergency Use Authorization of COVID monoclonal antibody casirivimab/imdevimab, bamlanivimab/etesevimab, or sotrovimab.  Has a (+) direct SARS-CoV-2 viral test result  Has mild or moderate COVID-19   Is NOT hospitalized due to COVID-19  Is within 10 days of symptom onset  Has at least one of the high risk factor(s) for progression to severe COVID-19 and/or hospitalization as defined in EUA.  Specific high risk criteria : BMI > 25 and Other high risk medical condition per CDC:  unvaccinated, sleep apnea   I have spoken and communicated the following to the patient or parent/caregiver regarding COVID monoclonal antibody treatment:  1. FDA has authorized the emergency use for the treatment of mild to moderate COVID-19 in adults and pediatric patients with positive results of direct SARS-CoV-2 viral testing who are 67 years of age and older weighing at least 40 kg, and who are at high risk for progressing to severe COVID-19 and/or hospitalization.  2. The significant known and potential risks and benefits of COVID monoclonal antibody, and the extent to which such potential risks and benefits are unknown.  3. Information on available alternative treatments and the risks and benefits of those alternatives, including clinical trials.  4. Patients treated with COVID monoclonal antibody should continue to self-isolate and use infection control measures (e.g., wear mask, isolate, social distance, avoid sharing personal items, clean and disinfect "high touch" surfaces, and frequent handwashing) according to CDC guidelines.   5. The patient or parent/caregiver has the option to accept or refuse COVID monoclonal antibody treatment.  After reviewing  this information with the patient, the patient has agreed to receive one of the available covid 19 monoclonal antibodies and will be provided an appropriate fact sheet prior to infusion. Nicholas Felix, PA-C 07/25/2020 12:25 PM

## 2020-07-25 NOTE — Discharge Instructions (Signed)
10 Things You Can Do to Manage Your COVID-19 Symptoms at Home If you have possible or confirmed COVID-19: 1. Stay home from work and school. And stay away from other public places. If you must go out, avoid using any kind of public transportation, ridesharing, or taxis. 2. Monitor your symptoms carefully. If your symptoms get worse, call your healthcare provider immediately. 3. Get rest and stay hydrated. 4. If you have a medical appointment, call the healthcare provider ahead of time and tell them that you have or may have COVID-19. 5. For medical emergencies, call 911 and notify the dispatch personnel that you have or may have COVID-19. 6. Cover your cough and sneezes with a tissue or use the inside of your elbow. 7. Wash your hands often with soap and water for at least 20 seconds or clean your hands with an alcohol-based hand sanitizer that contains at least 60% alcohol. 8. As much as possible, stay in a specific room and away from other people in your home. Also, you should use a separate bathroom, if available. If you need to be around other people in or outside of the home, wear a mask. 9. Avoid sharing personal items with other people in your household, like dishes, towels, and bedding. 10. Clean all surfaces that are touched often, like counters, tabletops, and doorknobs. Use household cleaning sprays or wipes according to the label instructions. cdc.gov/coronavirus 02/04/2019 This information is not intended to replace advice given to you by your health care provider. Make sure you discuss any questions you have with your health care provider. Document Revised: 07/09/2019 Document Reviewed: 07/09/2019 Elsevier Patient Education  2020 Elsevier Inc. What types of side effects do monoclonal antibody drugs cause?  Common side effects  In general, the more common side effects caused by monoclonal antibody drugs include: . Allergic reactions, such as hives or itching . Flu-like signs and  symptoms, including chills, fatigue, fever, and muscle aches and pains . Nausea, vomiting . Diarrhea . Skin rashes . Low blood pressure   The CDC is recommending patients who receive monoclonal antibody treatments wait at least 90 days before being vaccinated.  Currently, there are no data on the safety and efficacy of mRNA COVID-19 vaccines in persons who received monoclonal antibodies or convalescent plasma as part of COVID-19 treatment. Based on the estimated half-life of such therapies as well as evidence suggesting that reinfection is uncommon in the 90 days after initial infection, vaccination should be deferred for at least 90 days, as a precautionary measure until additional information becomes available, to avoid interference of the antibody treatment with vaccine-induced immune responses. If you have any questions or concerns after the infusion please call the Advanced Practice Provider on call at [phone]. This number is ONLY intended for your use regarding questions or concerns about the infusion post-treatment side-effects.  Please do not provide this number to others for use. For return to work notes please contact your primary care provider.   If someone you know is interested in receiving treatment please have them call the COVID hotline at [phone].   

## 2020-11-14 ENCOUNTER — Ambulatory Visit (INDEPENDENT_AMBULATORY_CARE_PROVIDER_SITE_OTHER): Payer: Medicaid Other | Admitting: Orthopedic Surgery

## 2020-11-14 ENCOUNTER — Other Ambulatory Visit: Payer: Self-pay

## 2020-11-14 DIAGNOSIS — M1712 Unilateral primary osteoarthritis, left knee: Secondary | ICD-10-CM | POA: Diagnosis not present

## 2020-11-17 ENCOUNTER — Encounter: Payer: Self-pay | Admitting: Orthopedic Surgery

## 2020-11-17 NOTE — Progress Notes (Signed)
Office Visit Note   Patient: Nicholas Horn           Date of Birth: [birth year]           MRN: [id number] Visit Date: 11/14/2020 Requested by: No referring provider defined for this encounter. PCP: Nicholas Horn, Nicholas R, DO (Inactive)  Subjective: Chief Complaint  Patient presents with  . Left Knee - Pain    HPI: Nicholas Horn is a 60 year old patient with severe left knee pain.  Rates the pain as 9 out of 10 without medication.  Describes primarily medial sided knee pain.  Has some occasional groin pain at the end of the day.  Underwent meniscal debridement left knee about 5 years ago.  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  The pain does wake him from sleep at night.  Denies any radicular pain.              ROS: All systems reviewed are negative as they relate to the chief complaint within the history of present illness.  Patient denies  fevers or chills.   Assessment & Plan: Visit Diagnoses:  1. Primary osteoarthritis of left knee     Plan: Impression is end-stage left knee arthritis with failure of conservative management including multiple injections activity modification as well as medication.  Has night pain and rest pain and pain which interferes with his daily activities.  Plan at this time is press-fit left total knee replacement.  BMI is below 40.  No other significant medical problems.  Risk and benefits of knee replacement are discussed with the patient including but not limited to infection nerve vessel damage knee stiffness the prolonged recovery involved as well as the potential need for revision in his lifetime.  Patient understands the risk and benefits and wishes to proceed.  All questions answered.  Follow-Up Instructions: No follow-ups on file.   Orders:  No orders of the defined types were placed in this encounter.  No orders of the defined types were placed in this encounter.     Procedures: No procedures performed   Clinical  Data: No additional findings.  Objective: Vital Signs: There were no vitals taken for this visit.  Physical Exam:   Constitutional: Patient appears well-developed HEENT:  Head: Normocephalic Eyes:EOM are normal Neck: Normal range of motion Cardiovascular: Normal rate Pulmonary/chest: Effort normal Neurologic: Patient is alert Skin: Skin is warm Psychiatric: Patient has normal mood and affect    Ortho Exam: Ortho exam demonstrates palpable pedal pulses.  No pitting edema bilateral lower extremities.  No groin pain on the left or right hand side with internal extra rotation of the leg.  Gait is antalgic to the left but not Trendelenburg.  Patient has 5 out of 5 ankle dorsiflexion plantarflexion abduction adduction hip flexion and quad and hamstring strength bilaterally.  No paresthesias L1-S1 bilaterally.  Left knee is examined.  Slight varus alignment is present.  Extensor mechanism is intact.  Collateral cruciate ligaments are stable.  5 degree flexion contracture is present but flexion is to about 125.  Skin is intact in that left knee region.  Radiographs do show medial joint space narrowing with sclerosis as well as mild to moderate patellofemoral arthritis.  Specialty Comments:  No specialty comments available.  Imaging: No results found.   PMFS History: Patient Active Problem List   Diagnosis Date Noted  . Primary osteoarthritis of left knee 10/08/2016  . Pain in right hand 09/21/2016  . Closed nondisplaced  fracture of proximal phalanx of right middle finger 08/24/2016  . Right hip pain 08/24/2016  . S/P laparoscopic appendectomy 10/02/2014  . Left knee pain 07/01/2013  . Morbid obesity ([place]) 01/23/2013  . Left lumbar radiculopathy 01/23/2013  . Heel pain 01/23/2013  . HYPERLIPIDEMIA 09/29/2008  . SLEEP APNEA, OBSTRUCTIVE 09/29/2008  . HEADACHE, SEVERE 09/29/2008   Past Medical History:  Diagnosis Date  . Arthritis    spine  . Back pain, chronic   . EKG  abnormality 2007   stress test and cardiac evaluation [facility] cardiology  . Hyperlipidemia    no meds, diet controlled  . Kidney stones    passed stones  . Shortness of breath    with exertion  . Sleep apnea 07   Does not use CPAP  . Vision impairment    wears reading glasses    Family History  Problem Relation Age of Onset  . Heart disease Mother 45       died of heart disease, age 62yo  . Obesity Mother   . Hypertension Mother   . Hyperlipidemia Mother   . Cancer Father        died of stomach and intestinal cancer  . Heart disease Brother        CAD, stent  . Other Brother        substance abuse  . Diabetes Neg Hx   . Stroke Neg Hx   . Colon cancer Neg Hx     Past Surgical History:  Procedure Laterality Date  . APPENDECTOMY    . BACK SURGERY  2014   Micro disectomy L3-4  . CARDIAC CATHETERIZATION  2007   due to equivocal stress test.  normal cath per pt report  . COLONOSCOPY  10/2012  . 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 Right 07/15/2013   Procedure: Resection right Haglund Deformity;  Surgeon: Newt Minion, MD;  Location: [place];  Service: Orthopedics;  Laterality: Right;  Resection right Haglund Deformity  . KNEE ARTHROSCOPY Left 2014  . KNEE SURGERY    . LAPAROSCOPIC APPENDECTOMY N/A 10/01/2014   Procedure: APPENDECTOMY LAPAROSCOPIC;  Surgeon: Georganna Skeans, MD;  Location: [place];  Service: General;  Laterality: N/A;  . LITHOTRIPSY     kidney stone   . WISDOM TOOTH EXTRACTION     age 36yo   Social History   Occupational History  . Occupation: Doctor, hospital: [employer]  Tobacco Use  . Smoking status: Former Smoker    Years: 7.00    Types: Cigarettes    Start date: 09/06/1985  . Smokeless tobacco: Never Used  . Tobacco comment: quit in 1987  Substance and Sexual Activity  . Alcohol use: No  . Drug use: No  . Sexual activity: Not Currently

## 2020-11-28 ENCOUNTER — Emergency Department (HOSPITAL_BASED_OUTPATIENT_CLINIC_OR_DEPARTMENT_OTHER)
Admission: EM | Admit: 2020-11-28 | Discharge: 2020-11-28 | Disposition: A | Discharge status: Home / Self Care | Payer: Medicaid Other | Attending: Emergency Medicine | Admitting: Emergency Medicine

## 2020-11-28 ENCOUNTER — Encounter (HOSPITAL_BASED_OUTPATIENT_CLINIC_OR_DEPARTMENT_OTHER): Payer: Self-pay

## 2020-11-28 ENCOUNTER — Emergency Department (HOSPITAL_BASED_OUTPATIENT_CLINIC_OR_DEPARTMENT_OTHER): Payer: Medicaid Other

## 2020-11-28 ENCOUNTER — Other Ambulatory Visit: Payer: Self-pay

## 2020-11-28 DIAGNOSIS — Z87891 Personal history of nicotine dependence: Secondary | ICD-10-CM | POA: Insufficient documentation

## 2020-11-28 DIAGNOSIS — S91031A Puncture wound without foreign body, right ankle, initial encounter: Secondary | ICD-10-CM | POA: Insufficient documentation

## 2020-11-28 DIAGNOSIS — L03115 Cellulitis of right lower limb: Secondary | ICD-10-CM

## 2020-11-28 DIAGNOSIS — Z23 Encounter for immunization: Secondary | ICD-10-CM | POA: Diagnosis not present

## 2020-11-28 DIAGNOSIS — S99911A Unspecified injury of right ankle, initial encounter: Secondary | ICD-10-CM | POA: Diagnosis present

## 2020-11-28 DIAGNOSIS — W1840XA Slipping, tripping and stumbling without falling, unspecified, initial encounter: Secondary | ICD-10-CM | POA: Insufficient documentation

## 2020-11-28 MED ORDER — TETANUS-DIPHTH-ACELL PERTUSSIS 5-2.5-18.5 LF-MCG/0.5 IM SUSY
0.5000 mL | PREFILLED_SYRINGE | Freq: Once | INTRAMUSCULAR | Status: AC
  Administered 2020-11-28: 0.5 mL via INTRAMUSCULAR
  Filled 2020-11-28: qty 0.5

## 2020-11-28 MED ORDER — MUPIROCIN 2 % EX OINT: 1.0000 "application " | TOPICAL_OINTMENT | Freq: Two times a day (BID) | CUTANEOUS | 0 refills | Status: DC

## 2020-11-28 MED ORDER — DOXYCYCLINE HYCLATE 100 MG PO CAPS: 100.0000 mg | ORAL_CAPSULE | Freq: Two times a day (BID) | ORAL | 0 refills | Status: DC

## 2020-11-28 NOTE — Discharge Instructions (Signed)
Please return tomorrow morning for an ultrasound of the right lower extremity to exclude a blood clot in the leg which can occur after injury. Make sure to take your antibiotics as prescribed. Contact a health care provider if: You have a fever. Your symptoms do not begin to improve within 1-2 days of starting treatment. Your bone or joint underneath the infected area becomes painful after the skin has healed. Your infection returns in the same area or another area. You notice a swollen bump in the infected area. You develop new symptoms. You have a general ill feeling (malaise) with muscle aches and pains. Get help right away if: Your symptoms get worse. You feel very sleepy. You develop vomiting or diarrhea that persists. You notice red streaks coming from the infected area. Your red area gets larger or turns dark in color.

## 2020-11-28 NOTE — ED Notes (Signed)
Rt ankle laceration dressed with 4x4 gauze and wrapped with kerlix.

## 2020-11-28 NOTE — ED Triage Notes (Signed)
Laceration right ankle onset saturday night.  States pain getting worse.  Tripped on "robbots"

## 2020-11-28 NOTE — ED Notes (Signed)
Laceration noted to front of tray about 1 1/2 inch long.  Two puncher wounds noted to inside of ankle.

## 2020-11-28 NOTE — ED Provider Notes (Signed)
[facility] EMERGENCY DEPARTMENT Provider Note   CSN: [id number] Arrival date & time: 11/28/20  2037     History Chief Complaint  Patient presents with  . Laceration    Laceration of right front of ankle    Nicholas Horn is a 60 y.o. male who presents emergency department chief complaint of laceration.  Patient was disassembling industrial size room at when he tripped over 1 in the dark, sliced open the front of his right ankle and punctured his medial ankle on the robot.  He washed out the wound and applied butterfly bandages.. Since that time he has had progressively worsening swelling, erythema and oozing from the wound.  Denies fevers or chills.  Is unsure of his last tetanus shot.  He denies history of diabetes or other immune compromise.  He has no history of DVT and denies chest pain or shortness of breath HPI     Past Medical History:  Diagnosis Date  . Arthritis    spine  . Back pain, chronic   . EKG abnormality 2007   stress test and cardiac evaluation [facility] cardiology  . Hyperlipidemia    no meds, diet controlled  . Kidney stones    passed stones  . Shortness of breath    with exertion  . Sleep apnea 07   Does not use CPAP  . Vision impairment    wears reading glasses    Patient Active Problem List   Diagnosis Date Noted  . Primary osteoarthritis of left knee 10/08/2016  . Pain in right hand 09/21/2016  . Closed nondisplaced fracture of proximal phalanx of right middle finger 08/24/2016  . Right hip pain 08/24/2016  . S/P laparoscopic appendectomy 10/02/2014  . Left knee pain 07/01/2013  . Morbid obesity ([place]) 01/23/2013  . Left lumbar radiculopathy 01/23/2013  . Heel pain 01/23/2013  . HYPERLIPIDEMIA 09/29/2008  . SLEEP APNEA, OBSTRUCTIVE 09/29/2008  . HEADACHE, SEVERE 09/29/2008    Past Surgical History:  Procedure Laterality Date  . APPENDECTOMY    . BACK SURGERY  2014   Micro disectomy L3-4  . CARDIAC CATHETERIZATION  2007    due to equivocal stress test.  normal cath per pt report  . COLONOSCOPY  10/2012  . 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 KNEE ARTHROSCOPY Left 2014  . KNEE SURGERY    . LAPAROSCOPIC APPENDECTOMY N/A 10/01/2014   Procedure: APPENDECTOMY LAPAROSCOPIC;  Surgeon: Georganna Skeans, MD;  Location: [place];  Service: General;  Laterality: N/A;  . LITHOTRIPSY     kidney stone   . WISDOM TOOTH EXTRACTION     age 85yo       Family History  Problem Relation Age of Onset  . Heart disease Mother 20       died of heart disease, age 19yo  . Obesity Mother   . Hypertension Mother   . Hyperlipidemia Mother   . Cancer Father        died of stomach and intestinal cancer  . Heart disease Brother        CAD, stent  . Other Brother        substance abuse  . Diabetes Neg Hx   . Stroke Neg Hx   . Colon cancer Neg Hx     Social History  Tobacco Use  . Smoking status: Former Smoker    Years: 7.00    Types: Cigarettes    Start date: 09/06/1985  . Smokeless tobacco: Never Used  . Tobacco comment: quit in 1987  Substance Use Topics  . Alcohol use: No  . Drug use: No    Home Medications Prior to Admission medications   Medication Sig Start Date End Date Taking? Authorizing Provider  DULoxetine (CYMBALTA) 20 MG capsule Take 20 mg by mouth 2 (two) times daily.    [provider]  meloxicam (MOBIC) 15 MG tablet Take 15 mg by mouth daily.    [provider]  methocarbamol (ROBAXIN) 750 MG tablet Take 750 mg by mouth daily.    [provider]  oxyCODONE-acetaminophen (PERCOCET) 10-325 MG tablet Take 1 tablet by mouth daily as needed for pain.    [provider]    Allergies    Honey  Review of Systems   Review of Systems Ten systems reviewed and are  negative for acute change, except as noted in the HPI.  Physical Exam Updated Vital Signs BP (!) 159/87 (BP Location: Right Arm)   Pulse 81   Temp 99.1 F (37.3 C) (Oral)   Resp 18   Ht 6\' 4"  (1.93 m)   Wt (!) 138.3 kg   SpO2 95%   BMI 37.13 kg/m   Physical Exam Vitals and nursing note reviewed.  Constitutional:      General: He is not in acute distress.    Appearance: He is well-developed. He is not diaphoretic.  HENT:     Head: Normocephalic and atraumatic.  Eyes:     General: No scleral icterus.    Conjunctiva/sclera: Conjunctivae normal.  Cardiovascular:     Rate and Rhythm: Normal rate and regular rhythm.     Heart sounds: Normal heart sounds.  Pulmonary:     Effort: Pulmonary effort is normal. No respiratory distress.     Breath sounds: Normal breath sounds.  Abdominal:     Palpations: Abdomen is soft.     Tenderness: There is no abdominal tenderness.  Musculoskeletal:     Cervical back: Normal range of motion and neck supple.     Comments: Laceration to the anterior right ankle, the puncture wound to the medial ankle, swelling erythema and serosanguineous discharge from the wound, no calf tenderness.  DP and PT pulse 2+, normal sensation normal movement of the ankle and toes  Skin:    General: Skin is warm and dry.  Neurological:     Mental Status: He is alert.  Psychiatric:        Behavior: Behavior normal.           ED Results / Procedures / Treatments   Labs (all labs ordered are listed, but only abnormal results are displayed) Labs Reviewed - No data to display  EKG None  Radiology No results found.  Procedures Procedures  Medications Ordered in ED Medications - No data to display  ED Course  I have reviewed the triage vital signs and the nursing notes.  Pertinent labs & imaging results that were available during my care of the patient were reviewed by me and considered in my medical decision making (see chart for details).    MDM  Rules/Calculators/A&P                          60 year old male here with laceration 2 days ago to the right ankle.  Now with swelling and increasing pain.  This is suspicious for early cellulitis.  Patient also has significant swelling as compared to the left side.  My suspicion for DVT is low and I do not feel that he needs urgent treatment however I have suggested he return tomorrow for an ultrasound of the leg to rule that out and have placed orders.  Patient will be discharged with oral doxycycline and topical Bactroban to the wounds.  Discussed outpatient follow-up and return medication precautions.  Appears otherwise appropriate for discharge at this time and is hemodynamically stable Final Clinical Impression(s) / ED Diagnoses Final diagnoses:  None    Rx / DC Orders ED Discharge Orders    None       Margarita Mail, PA-C 11/28/20 2304    Lorelle Gibbs, DO 11/30/20 [phone]

## 2020-11-29 ENCOUNTER — Ambulatory Visit (HOSPITAL_BASED_OUTPATIENT_CLINIC_OR_DEPARTMENT_OTHER)
Admission: RE | Admit: 2020-11-29 | Discharge: 2020-11-29 | Disposition: A | Discharge status: Home / Self Care | Payer: Medicaid Other | Source: Ambulatory Visit | Attending: Emergency Medicine | Admitting: Emergency Medicine

## 2020-11-29 DIAGNOSIS — M7989 Other specified soft tissue disorders: Secondary | ICD-10-CM | POA: Diagnosis not present

## 2020-12-01 ENCOUNTER — Other Ambulatory Visit: Payer: Self-pay

## 2020-12-01 NOTE — Progress Notes (Signed)
Called Dr. Randel Pigg office to make aware of recent ankle laceration.  Pt was seen in the ED on 11/28/20.

## 2020-12-02 ENCOUNTER — Encounter: Payer: Self-pay | Admitting: Surgical

## 2020-12-02 ENCOUNTER — Ambulatory Visit (INDEPENDENT_AMBULATORY_CARE_PROVIDER_SITE_OTHER): Payer: Medicaid Other | Admitting: Surgical

## 2020-12-02 ENCOUNTER — Other Ambulatory Visit (HOSPITAL_COMMUNITY): Payer: Medicaid Other

## 2020-12-02 ENCOUNTER — Inpatient Hospital Stay (HOSPITAL_COMMUNITY)
Admission: RE | Admit: 2020-12-02 | Discharge: 2020-12-02 | Disposition: A | Discharge status: Home / Self Care | Payer: Medicaid Other | Source: Ambulatory Visit

## 2020-12-02 VITALS — BP 155/99 | HR 69 | Temp 97.4°F

## 2020-12-02 DIAGNOSIS — S8991XA Unspecified injury of right lower leg, initial encounter: Secondary | ICD-10-CM

## 2020-12-02 MED ORDER — CEPHALEXIN 500 MG PO CAPS: 500.0000 mg | ORAL_CAPSULE | Freq: Four times a day (QID) | ORAL | 0 refills | Status: AC

## 2020-12-04 ENCOUNTER — Encounter: Payer: Self-pay | Admitting: Surgical

## 2020-12-04 NOTE — Progress Notes (Signed)
Office Visit Note   Patient: Nicholas Horn           Date of Birth: Feb 22, 1961           MRN: [id number] Visit Date: 12/02/2020 Requested by: No referring provider defined for this encounter. PCP: Shawna Orleans, Doe-Hyun R, DO (Inactive)  Subjective: Chief Complaint  Patient presents with  . Right Leg - Pain    HPI: Nicholas Horn is a 60 y.o. male who presents to the office complaining of right lower extremity swelling.  Patient reports that he tripped and cut his anterior right ankle when walking through his yard last Saturday night.  He was seen in the emergency department on 11/28/2020 and started on a course of doxycycline.  He has been taking the doxycycline since the ED visit as prescribed but has had some continued redness and drainage around his laceration.  He has been try to keep the leg elevated.  Denies any fevers or chills but does note some malaise.  Overall feels well for the most part.  He is mostly concerned about the degree of swelling that he has in the right foot.Marland Kitchen  He did have ultrasound in the emergency department that was negative for DVT.              ROS: All systems reviewed are negative as they relate to the chief complaint within the history of present illness.  Patient denies fevers or chills.  Assessment & Plan: Visit Diagnoses:  1. Right leg injury, initial encounter     Plan: Patient is a 60 year old male who presents complaining of right lower extremity swelling as well as surrounding erythema and expressible drainage from right leg laceration.  He has been taking doxycycline without any significant improvement in his symptoms.  Plan to switch from doxycycline to Keflex.  Vital signs taken today show temperature of 97.4, heart rate 69, blood pressure 155/99.  He denies any fevers or chills and appears well on examination today.  No signs of sepsis.  Plan to try this new course of antibiotics and follow-up on Monday for clinical recheck with Dr. Marlou Sa.  Encourage  patient to reach out to the office on-call number or go straight to the emergency department if he develops any persistent fevers, chills, night sweats, grossly increased drainage, general feeling of unwellness or severe fatigue.  Patient agreed with this plan, follow-up on Monday.  Follow-Up Instructions: No follow-ups on file.   Orders:  No orders of the defined types were placed in this encounter.  Meds ordered this encounter  Medications  . cephALEXin (KEFLEX) 500 MG capsule    Sig: Take 1 capsule (500 mg total) by mouth 4 (four) times daily for 10 days.    Dispense:  40 capsule    Refill:  0      Procedures: No procedures performed   Clinical Data: No additional findings.  Objective: Vital Signs: BP (!) 155/99   Pulse 69   Temp (!) 97.4 F (36.3 C)   Physical Exam:  Constitutional: Patient appears well-developed HEENT:  Head: Normocephalic Eyes:EOM are normal Neck: Normal range of motion Cardiovascular: Normal rate Pulmonary/chest: Effort normal Neurologic: Patient is alert Skin: Skin is warm Psychiatric: Patient has normal mood and affect  Ortho Exam: Ortho exam demonstrates right ankle with significant amount of swelling and edema compared with the left ankle.  Laceration of the anterior right ankle with erythema surrounding the border of the laceration.  There is expressible drainage.  No  significant fluctuance or abscess felt throughout the right calf or foot.  He has some mild tenderness throughout the toes.  No calf tenderness.  Negative Homans' sign.          Specialty Comments:  No specialty comments available.  Imaging: No results found.   PMFS History: Patient Active Problem List   Diagnosis Date Noted  . Primary osteoarthritis of left knee 10/08/2016  . Pain in right hand 09/21/2016  . Closed nondisplaced fracture of proximal phalanx of right middle finger 08/24/2016  . Right hip pain 08/24/2016  . S/P laparoscopic appendectomy  10/02/2014  . Left knee pain 07/01/2013  . Morbid obesity ([place]) 01/23/2013  . Left lumbar radiculopathy 01/23/2013  . Heel pain 01/23/2013  . HYPERLIPIDEMIA 09/29/2008  . SLEEP APNEA, OBSTRUCTIVE 09/29/2008  . HEADACHE, SEVERE 09/29/2008   Past Medical History:  Diagnosis Date  . Arthritis    spine  . Back pain, chronic   . EKG abnormality 2007   stress test and cardiac evaluation [facility] cardiology  . Hyperlipidemia    no meds, diet controlled  . Kidney stones    passed stones  . Shortness of breath    with exertion  . Sleep apnea 07   Does not use CPAP  . Vision impairment    wears reading glasses    Family History  Problem Relation Age of Onset  . Heart disease Mother 77       died of heart disease, age 53yo  . Obesity Mother   . Hypertension Mother   . Hyperlipidemia Mother   . Cancer Father        died of stomach and intestinal cancer  . Heart disease Brother        CAD, stent  . Other Brother        substance abuse  . Diabetes Neg Hx   . Stroke Neg Hx   . Colon cancer Neg Hx     Past Surgical History:  Procedure Laterality Date  . APPENDECTOMY    . BACK SURGERY  2014   Micro disectomy L3-4  . CARDIAC CATHETERIZATION  2007   due to equivocal stress test.  normal cath per pt report  . COLONOSCOPY  10/2012  . 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 KNEE ARTHROSCOPY Left 2014  . KNEE SURGERY    . LAPAROSCOPIC APPENDECTOMY N/A 10/01/2014   Procedure: APPENDECTOMY LAPAROSCOPIC;  Surgeon: Georganna Skeans, MD;  Location: [place];  Service: General;  Laterality: N/A;  . LITHOTRIPSY     kidney stone   . WISDOM TOOTH EXTRACTION     age 11yo   Social History   Occupational History  . 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 Smoking status: Former Smoker    Years: 7.00    Types: Cigarettes    Start date: 09/06/1985  . Smokeless tobacco: Never Used  . Tobacco comment: quit in 1987  Substance and Sexual Activity  . Alcohol use: No  . Drug use: No  . Sexual activity: Not Currently

## 2020-12-05 ENCOUNTER — Ambulatory Visit (INDEPENDENT_AMBULATORY_CARE_PROVIDER_SITE_OTHER): Payer: Medicaid Other | Admitting: Orthopedic Surgery

## 2020-12-05 DIAGNOSIS — S8991XA Unspecified injury of right lower leg, initial encounter: Secondary | ICD-10-CM | POA: Diagnosis not present

## 2020-12-05 DIAGNOSIS — M79672 Pain in left foot: Secondary | ICD-10-CM

## 2020-12-06 ENCOUNTER — Other Ambulatory Visit (HOSPITAL_COMMUNITY): Payer: Medicaid Other

## 2020-12-06 ENCOUNTER — Encounter: Payer: Self-pay | Admitting: Orthopedic Surgery

## 2020-12-06 NOTE — Progress Notes (Signed)
Office Visit Note   Patient: Nicholas Horn           Date of Birth: [birth year]           MRN: [id number] Visit Date: 12/05/2020 Requested by: No referring provider defined for this encounter. PCP: Shawna Orleans, Doe-Hyun R, DO (Inactive)  Subjective: Chief Complaint  Patient presents with  . Right Leg - Wound Check    HPI: Nicholas Horn is a 60 year old patient with right lower extremity laceration.  He was scheduled for left total knee replacement but that was canceled due to this laceration.  He is on oral antibiotics.  Not having any fevers or chills.  Overall he is improving.              ROS: All systems reviewed are negative as they relate to the chief complaint within the history of present illness.  Patient denies  fevers or chills.   Assessment & Plan: Visit Diagnoses:  1. Pain in left foot     Plan: Impression is improving right lower extremity laceration.  Finish course of antibiotics and follow-up in 3 weeks.  Once this thing is fully healed we can reschedule his knee replacement.  No evidence of induration fluctuance or ongoing infection at this time.  Follow-Up Instructions: Return in about 3 weeks (around 12/26/2020).   Orders:  No orders of the defined types were placed in this encounter.  No orders of the defined types were placed in this encounter.     Procedures: No procedures performed   Clinical Data: No additional findings.  Objective: Vital Signs: There were no vitals taken for this visit.  Physical Exam:   Constitutional: Patient appears well-developed HEENT:  Head: Normocephalic Eyes:EOM are normal Neck: Normal range of motion Cardiovascular: Normal rate Pulmonary/chest: Effort normal Neurologic: Patient is alert Skin: Skin is warm Psychiatric: Patient has normal mood and affect    Ortho Exam: Ortho exam demonstrates intact ankle dorsiflexion on the right.  There is some swelling around the laceration but no fluctuance or induration.  No  erythema which persist with elevation of the right foot above the heart.  Pedal pulses palpable.  Specialty Comments:  No specialty comments available.  Imaging: No results found.   PMFS History: Patient Active Problem List   Diagnosis Date Noted  . Primary osteoarthritis of left knee 10/08/2016  . Pain in right hand 09/21/2016  . Closed nondisplaced fracture of proximal phalanx of right middle finger 08/24/2016  . Right hip pain 08/24/2016  . S/P laparoscopic appendectomy 10/02/2014  . Left knee pain 07/01/2013  . Morbid obesity ([place]) 01/23/2013  . Left lumbar radiculopathy 01/23/2013  . Heel pain 01/23/2013  . HYPERLIPIDEMIA 09/29/2008  . SLEEP APNEA, OBSTRUCTIVE 09/29/2008  . HEADACHE, SEVERE 09/29/2008   Past Medical History:  Diagnosis Date  . Arthritis    spine  . Back pain, chronic   . EKG abnormality 2007   stress test and cardiac evaluation [facility] cardiology  . Hyperlipidemia    no meds, diet controlled  . Kidney stones    passed stones  . Shortness of breath    with exertion  . Sleep apnea 07   Does not use CPAP  . Vision impairment    wears reading glasses    Family History  Problem Relation Age of Onset  . Heart disease Mother 25       died of heart disease, age 33yo  . Obesity Mother   . Hypertension Mother   .  Hyperlipidemia Mother   . Cancer Father        died of stomach and intestinal cancer  . Heart disease Brother        CAD, stent  . Other Brother        substance abuse  . Diabetes Neg Hx   . Stroke Neg Hx   . Colon cancer Neg Hx     Past Surgical History:  Procedure Laterality Date  . APPENDECTOMY    . BACK SURGERY  2014   Micro disectomy L3-4  . CARDIAC CATHETERIZATION  2007   due to equivocal stress test.  normal cath per pt report  . COLONOSCOPY  10/2012  . 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 KNEE ARTHROSCOPY Left 2014  . KNEE SURGERY    . LAPAROSCOPIC APPENDECTOMY N/A 10/01/2014   Procedure: APPENDECTOMY LAPAROSCOPIC;  Surgeon: Georganna Skeans, MD;  Location: [place];  Service: General;  Laterality: N/A;  . LITHOTRIPSY     kidney stone   . WISDOM TOOTH EXTRACTION     age 79yo   Social History   Occupational History  . 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 Smoking status: Former Smoker    Years: 7.00    Types: Cigarettes    Start date: 09/06/1985  . Smokeless tobacco: Never Used  . Tobacco comment: quit in 1987  Substance and Sexual Activity  . Alcohol use: No  . Drug use: No  . Sexual activity: Not Currently

## 2020-12-07 ENCOUNTER — Other Ambulatory Visit: Payer: Self-pay

## 2020-12-21 ENCOUNTER — Inpatient Hospital Stay: Payer: Medicaid Other | Admitting: Orthopedic Surgery

## 2020-12-26 ENCOUNTER — Ambulatory Visit (INDEPENDENT_AMBULATORY_CARE_PROVIDER_SITE_OTHER): Payer: Medicaid Other | Admitting: Orthopedic Surgery

## 2020-12-26 DIAGNOSIS — S8991XA Unspecified injury of right lower leg, initial encounter: Secondary | ICD-10-CM

## 2021-01-02 ENCOUNTER — Encounter: Payer: Self-pay | Admitting: Orthopedic Surgery

## 2021-01-02 NOTE — Progress Notes (Signed)
Office Visit Note   Patient: Nicholas Horn           Date of Birth: [birth year]           MRN: [id number] Visit Date: 12/26/2020 Requested by: No referring provider defined for this encounter. PCP: Shawna Orleans, Doe-Hyun R, DO (Inactive)  Subjective: Chief Complaint  Patient presents with  . Right Leg - Wound Check    HPI: Nicholas Horn is a 60 year old patient scheduled for left total knee replacement.  Had right foot laceration.  That is doing well.  He is off antibiotics.  Has not had any exudate from the granulating wound for 5 days.  Scheduled for June 12.              ROS: All systems reviewed are negative as they relate to the chief complaint within the history of present illness.  Patient denies  fevers or chills.   Assessment & Plan: Visit Diagnoses:  1. Right leg injury, initial encounter     Plan: Impression is healing right leg injury.  He should be ready to go for total knee replacement on the left-hand side by June 12.  All questions answered.  Follow-Up Instructions: No follow-ups on file.   Orders:  No orders of the defined types were placed in this encounter.  No orders of the defined types were placed in this encounter.     Procedures: No procedures performed   Clinical Data: No additional findings.  Objective: Vital Signs: There were no vitals taken for this visit.  Physical Exam:   Constitutional: Patient appears well-developed HEENT:  Head: Normocephalic Eyes:EOM are normal Neck: Normal range of motion Cardiovascular: Normal rate Pulmonary/chest: Effort normal Neurologic: Patient is alert Skin: Skin is warm Psychiatric: Patient has normal mood and affect    Ortho Exam: Ortho exam demonstrates on the right-hand side good ankle dorsiflexion plantarflexion strength.  Granulating laceration healing measuring about 2-1/2 cm x 6 mm.  No exudate drainage or significant erythema.  Specialty Comments:  No specialty comments available.  Imaging: No  results found.   PMFS History: Patient Active Problem List   Diagnosis Date Noted  . Primary osteoarthritis of left knee 10/08/2016  . Pain in right hand 09/21/2016  . Closed nondisplaced fracture of proximal phalanx of right middle finger 08/24/2016  . Right hip pain 08/24/2016  . S/P laparoscopic appendectomy 10/02/2014  . Left knee pain 07/01/2013  . Morbid obesity ([place]) 01/23/2013  . Left lumbar radiculopathy 01/23/2013  . Heel pain 01/23/2013  . HYPERLIPIDEMIA 09/29/2008  . SLEEP APNEA, OBSTRUCTIVE 09/29/2008  . HEADACHE, SEVERE 09/29/2008   Past Medical History:  Diagnosis Date  . Arthritis    spine  . Back pain, chronic   . EKG abnormality 2007   stress test and cardiac evaluation [facility] cardiology  . Hyperlipidemia    no meds, diet controlled  . Kidney stones    passed stones  . Shortness of breath    with exertion  . Sleep apnea 07   Does not use CPAP  . Vision impairment    wears reading glasses    Family History  Problem Relation Age of Onset  . Heart disease Mother 36       died of heart disease, age 48yo  . Obesity Mother   . Hypertension Mother   . Hyperlipidemia Mother   . Cancer Father        died of stomach and intestinal cancer  . Heart disease Brother  CAD, stent  . Other Brother        substance abuse  . Diabetes Neg Hx   . Stroke Neg Hx   . Colon cancer Neg Hx     Past Surgical History:  Procedure Laterality Date  . APPENDECTOMY    . BACK SURGERY  2014   Micro disectomy L3-4  . CARDIAC CATHETERIZATION  2007   due to equivocal stress test.  normal cath per pt report  . COLONOSCOPY  10/2012  . 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 KNEE ARTHROSCOPY Left 2014  . KNEE SURGERY    . LAPAROSCOPIC APPENDECTOMY N/A  10/01/2014   Procedure: APPENDECTOMY LAPAROSCOPIC;  Surgeon: Georganna Skeans, MD;  Location: [place];  Service: General;  Laterality: N/A;  . LITHOTRIPSY     kidney stone   . WISDOM TOOTH EXTRACTION     age 67yo   Social History   Occupational History  . 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 Smoking status: Former Smoker    Years: 7.00    Types: Cigarettes    Start date: 09/06/1985  . Smokeless tobacco: Never Used  . Tobacco comment: quit in 1987  Substance and Sexual Activity  . Alcohol use: No  . Drug use: No  . Sexual activity: Not Currently

## 2021-01-12 NOTE — Progress Notes (Signed)
Surgical Instructions    Your procedure is scheduled on Tuesday June 14th.  Report to [facility] Main Entrance "A" at 5:30 A.M., then check in with the Admitting office.  Call this number if you have problems the morning of surgery:  [phone]   If you have any questions prior to your surgery date call [phone]: Open Monday-Friday 8am-4pm    Remember:  Do not eat after midnight the night before your surgery  You may drink clear liquids until 4:30am the morning of your surgery.   Clear liquids allowed are: Water, Non-Citrus Juices (without pulp), Carbonated Beverages, Clear Tea, Black Coffee Only, and Gatorade    Enhanced Recovery after Surgery for Orthopedics Enhanced Recovery after Surgery is a protocol used to improve the stress on your body and your recovery after surgery.  Patient Instructions  The day of surgery (if you do NOT have diabetes):  Drink ONE (1) Pre-Surgery Clear Ensure by __4:30___ am the morning of surgery   This drink was given to you during your hospital  pre-op appointment visit. Nothing else to drink after completing the  Pre-Surgery Clear Ensure.          If you have questions, please contact your surgeon's office.   Take these medicines the morning of surgery with A SIP OF WATER    doxycycline (VIBRAMYCIN) 100 MG capsule DULoxetine (CYMBALTA) 20 MG capsule methocarbamol (ROBAXIN) 750 MG tablet if needed oxyCODONE-acetaminophen (PERCOCET) 10-325 MG tablet if needed   As of today, STOP taking any Aspirin (unless otherwise instructed by your surgeon) Aleve, Naproxen, Ibuprofen, Motrin, Advil, Goody's, BC's, all herbal medications, fish oil, and all vitamins.          Do not wear jewelry or makeup Do not wear lotions, powders, colognes, or deodorant. Do not shave 48 hours prior to surgery.  Men may shave face and neck. Do not bring valuables to the hospital.            [facility] is not responsible for any belongings or valuables.  Do NOT  Smoke (Tobacco/Vaping) or drink Alcohol 24 hours prior to your procedure If you use a CPAP at night, you may bring all equipment for your overnight stay.   Contacts, glasses, dentures or bridgework may not be worn into surgery, please bring cases for these belongings   For patients admitted to the hospital, discharge time will be determined by your treatment team.   Patients discharged the day of surgery will not be allowed to drive home, and someone needs to stay with them for 24 hours.  ONLY 1 SUPPORT PERSON MAY BE PRESENT WHILE YOU ARE IN SURGERY. IF YOU ARE TO BE ADMITTED ONCE YOU ARE IN YOUR ROOM YOU WILL BE ALLOWED TWO (2) VISITORS.  Minor children may have two parents present. Special consideration for safety and communication needs will be reviewed on a case by case basis.  Special instructions:    Oral Hygiene is also important to reduce your risk of infection.  Remember - BRUSH YOUR TEETH THE MORNING OF SURGERY WITH YOUR REGULAR TOOTHPASTE   [facility]- Preparing For Surgery  Before surgery, you can play an important role. Because skin is not sterile, your skin needs to be as free of germs as possible. You can reduce the number of germs on your skin by washing with CHG (chlorahexidine gluconate) Soap before surgery.  CHG is an antiseptic cleaner which kills germs and bonds with the skin to continue killing germs even after washing.  Please do not use if you have an allergy to CHG or antibacterial soaps. If your skin becomes reddened/irritated stop using the CHG.  Do not shave (including legs and underarms) for at least 48 hours prior to first CHG shower. It is OK to shave your face.  Please follow these instructions carefully.     Shower the NIGHT BEFORE SURGERY and the MORNING OF SURGERY with CHG Soap.   If you chose to wash your hair, wash your hair first as usual with your normal shampoo. After you shampoo, rinse your hair and body thoroughly to remove the shampoo.  Then  [employer] and genitals (private parts) with your normal soap and rinse thoroughly to remove soap.  After that Use CHG Soap as you would any other liquid soap. You can apply CHG directly to the skin and wash gently with a scrungie or a clean washcloth.   Apply the CHG Soap to your body ONLY FROM THE NECK DOWN.  Do not use on open wounds or open sores. Avoid contact with your eyes, ears, mouth and genitals (private parts). Wash Face and genitals (private parts)  with your normal soap.   Wash thoroughly, paying special attention to the area where your surgery will be performed.  Thoroughly rinse your body with warm water from the neck down.  DO NOT shower/wash with your normal soap after using and rinsing off the CHG Soap.  Pat yourself dry with a CLEAN TOWEL.  Wear CLEAN PAJAMAS to bed the night before surgery  Place CLEAN SHEETS on your bed the night before your surgery  DO NOT SLEEP WITH PETS.   Day of Surgery:  Take a shower with CHG soap. Wear Clean/Comfortable clothing the morning of surgery Do not apply any deodorants/lotions.   Remember to brush your teeth WITH YOUR REGULAR TOOTHPASTE.   Please read over the following fact sheets that you were given.

## 2021-01-13 ENCOUNTER — Other Ambulatory Visit (HOSPITAL_COMMUNITY): Payer: Medicaid Other

## 2021-01-13 ENCOUNTER — Encounter (HOSPITAL_COMMUNITY)
Admission: RE | Admit: 2021-01-13 | Discharge: 2021-01-13 | Disposition: A | Discharge status: Home / Self Care | Payer: Medicaid Other | Source: Ambulatory Visit | Attending: Orthopedic Surgery | Admitting: Orthopedic Surgery

## 2021-01-13 ENCOUNTER — Encounter (HOSPITAL_COMMUNITY): Payer: Self-pay

## 2021-01-13 ENCOUNTER — Other Ambulatory Visit: Payer: Self-pay

## 2021-01-13 DIAGNOSIS — Z01812 Encounter for preprocedural laboratory examination: Secondary | ICD-10-CM | POA: Insufficient documentation

## 2021-01-13 DIAGNOSIS — Z20822 Contact with and (suspected) exposure to covid-19: Secondary | ICD-10-CM | POA: Diagnosis not present

## 2021-01-13 HISTORY — DX: Personal history of urinary calculi: Z87.442

## 2021-01-13 LAB — URINALYSIS, ROUTINE W REFLEX MICROSCOPIC
Bilirubin Urine: NEGATIVE
Glucose, UA: NEGATIVE mg/dL
Hgb urine dipstick: NEGATIVE
Ketones, ur: NEGATIVE mg/dL
Leukocytes,Ua: NEGATIVE
Nitrite: NEGATIVE
Protein, ur: NEGATIVE mg/dL
Specific Gravity, Urine: 1.023 (ref 1.005–1.030)
pH: 5 (ref 5.0–8.0)

## 2021-01-13 LAB — CBC
HCT: 45.2 % (ref 39.0–52.0)
Hemoglobin: 15 g/dL (ref 13.0–17.0)
MCH: 30.9 pg (ref 26.0–34.0)
MCHC: 33.2 g/dL (ref 30.0–36.0)
MCV: 93.2 fL (ref 80.0–100.0)
Platelets: 212 10*3/uL (ref 150–400)
RBC: 4.85 MIL/uL (ref 4.22–5.81)
RDW: 12.6 % (ref 11.5–15.5)
WBC: 5.7 10*3/uL (ref 4.0–10.5)
nRBC: 0 % (ref 0.0–0.2)

## 2021-01-13 LAB — BASIC METABOLIC PANEL
Anion gap: 4 — ABNORMAL LOW (ref 5–15)
BUN: 12 mg/dL (ref 6–20)
CO2: 30 mmol/L (ref 22–32)
Calcium: 9.4 mg/dL (ref 8.9–10.3)
Chloride: 105 mmol/L (ref 98–111)
Creatinine, Ser: 1.03 mg/dL (ref 0.61–1.24)
GFR, Estimated: >60 mL/min (ref >60)
Glucose, Bld: 77 mg/dL (ref 70–99)
Potassium: 3.8 mmol/L (ref 3.5–5.1)
Sodium: 139 mmol/L (ref 135–145)

## 2021-01-13 LAB — SURGICAL PCR SCREEN
MRSA, PCR: NEGATIVE
Staphylococcus aureus: NEGATIVE

## 2021-01-13 LAB — SARS CORONAVIRUS 2 (TAT 6-24 HRS): SARS Coronavirus 2: NEGATIVE

## 2021-01-13 NOTE — Progress Notes (Addendum)
PCP - [facility]  Chest x-ray - Not indicated EKG - Not  indicated Stress Test - 2006-2007 at [facility] followed by Cardiac Catheterization per patient Cardiac Cath - 08/23/05 nothing seen Per patient due to stress  Sleep Study - Yes has sleep apnea CPAP - Broken not used in a while   DM- Denies  ERAS Protcol -Yes PRE-SURGERY Ensure - Patient stated he did not want it because of the sucrolose ingredient, he would not drink it.   COVID TEST- 01/13/21   Anesthesia review: No   Patient denies shortness of breath, fever, cough and chest pain at PAT appointment   All instructions explained to the patient, with a verbal understanding of the material. Patient agrees to go over the instructions while at home for a better understanding. Patient also instructed to wear mask in public after being tested for COVID-19. The opportunity to ask questions was provided.

## 2021-01-14 LAB — URINE CULTURE: Culture: <10000 — AB

## 2021-01-16 ENCOUNTER — Encounter (HOSPITAL_COMMUNITY): Payer: Self-pay | Admitting: Orthopedic Surgery

## 2021-01-16 MED ORDER — TRANEXAMIC ACID 1000 MG/10ML IV SOLN
2000.0000 mg | INTRAVENOUS | Status: DC
  Filled 2021-01-16 (×2): qty 20

## 2021-01-16 NOTE — Anesthesia Preprocedure Evaluation (Addendum)
Anesthesia Evaluation  Patient identified by MRN, date of birth, ID band Patient awake    Reviewed: Allergy & Precautions, NPO status , Patient's Chart, lab work & pertinent test results  Airway Mallampati: II       Dental no notable dental hx.    Pulmonary sleep apnea , former smoker,    Pulmonary exam normal        Cardiovascular negative cardio ROS Normal cardiovascular exam     Neuro/Psych negative psych ROS   GI/Hepatic negative GI ROS, Neg liver ROS,   Endo/Other  Morbid obesity  Renal/GU   negative genitourinary   Musculoskeletal  (+) Arthritis , Osteoarthritis,    Abdominal (+) + obese,   Peds  Hematology   Anesthesia Other Findings   Reproductive/Obstetrics                            Anesthesia Physical Anesthesia Plan  ASA: 3  Anesthesia Plan: Spinal   Post-op Pain Management:  Regional for Post-op pain   Induction:   PONV Risk Score and Plan: 1 and Ondansetron, Dexamethasone and Midazolam  Airway Management Planned: Natural Airway, Simple Face Mask and Nasal Cannula  Additional Equipment: None  Intra-op Plan:   Post-operative Plan:   Informed Consent: I have reviewed the patients History and Physical, chart, labs and discussed the procedure including the risks, benefits and alternatives for the proposed anesthesia with the patient or authorized representative who has indicated his/her understanding and acceptance.       Plan Discussed with: CRNA  Anesthesia Plan Comments:        Anesthesia Quick Evaluation

## 2021-01-17 ENCOUNTER — Observation Stay (HOSPITAL_COMMUNITY)
Admission: RE | Admit: 2021-01-17 | Discharge: 2021-01-18 | Disposition: A | Discharge status: Home / Self Care | Payer: Medicaid Other | Attending: Orthopedic Surgery | Admitting: Orthopedic Surgery

## 2021-01-17 ENCOUNTER — Ambulatory Visit (HOSPITAL_COMMUNITY): Payer: Medicaid Other | Admitting: Physician Assistant

## 2021-01-17 ENCOUNTER — Encounter (HOSPITAL_COMMUNITY)
Admission: RE | Disposition: A | Discharge status: Home / Self Care | Payer: Self-pay | Source: Home / Self Care | Attending: Orthopedic Surgery

## 2021-01-17 ENCOUNTER — Encounter (HOSPITAL_COMMUNITY): Payer: Self-pay | Admitting: Orthopedic Surgery

## 2021-01-17 ENCOUNTER — Other Ambulatory Visit: Payer: Self-pay

## 2021-01-17 ENCOUNTER — Ambulatory Visit (HOSPITAL_COMMUNITY): Payer: Medicaid Other | Admitting: Certified Registered Nurse Anesthetist

## 2021-01-17 DIAGNOSIS — M1712 Unilateral primary osteoarthritis, left knee: Secondary | ICD-10-CM

## 2021-01-17 DIAGNOSIS — Z96652 Presence of left artificial knee joint: Secondary | ICD-10-CM

## 2021-01-17 DIAGNOSIS — M1710 Unilateral primary osteoarthritis, unspecified knee: Secondary | ICD-10-CM

## 2021-01-17 DIAGNOSIS — Z87891 Personal history of nicotine dependence: Secondary | ICD-10-CM | POA: Diagnosis not present

## 2021-01-17 HISTORY — PX: TOTAL KNEE ARTHROPLASTY: SHX125

## 2021-01-17 SURGERY — ARTHROPLASTY, KNEE, TOTAL
Anesthesia: Spinal | Site: Knee | Laterality: Left

## 2021-01-17 MED ORDER — STERILE WATER FOR IRRIGATION IR SOLN
Status: DC | PRN
  Administered 2021-01-17: 1000 mL

## 2021-01-17 MED ORDER — TRANEXAMIC ACID 1000 MG/10ML IV SOLN
INTRAVENOUS | Status: DC | PRN
  Administered 2021-01-17: 2000 mg via TOPICAL

## 2021-01-17 MED ORDER — CLONIDINE HCL (ANALGESIA) 100 MCG/ML EP SOLN
EPIDURAL | Status: DC | PRN
  Administered 2021-01-17: 100 ug

## 2021-01-17 MED ORDER — ASPIRIN 81 MG PO CHEW
81.0000 mg | CHEWABLE_TABLET | Freq: Two times a day (BID) | ORAL | Status: DC
  Administered 2021-01-17 – 2021-01-18 (×2): 81 mg via ORAL
  Filled 2021-01-17 (×2): qty 1

## 2021-01-17 MED ORDER — ROPIVACAINE HCL 7.5 MG/ML IJ SOLN
INTRAMUSCULAR | Status: DC | PRN
  Administered 2021-01-17 (×6): 5 mL via PERINEURAL

## 2021-01-17 MED ORDER — BUPIVACAINE HCL (PF) 0.25 % IJ SOLN
INTRAMUSCULAR | Status: AC
  Filled 2021-01-17: qty 30

## 2021-01-17 MED ORDER — ACETAMINOPHEN 325 MG PO TABS: 325.0000 mg | ORAL_TABLET | Freq: Four times a day (QID) | ORAL | Status: DC | PRN

## 2021-01-17 MED ORDER — LACTATED RINGERS IV SOLN: INTRAVENOUS | Status: DC

## 2021-01-17 MED ORDER — HYDROMORPHONE HCL 1 MG/ML IJ SOLN
INTRAMUSCULAR | Status: AC
  Filled 2021-01-17: qty 1

## 2021-01-17 MED ORDER — BUPIVACAINE LIPOSOME 1.3 % IJ SUSP
INTRAMUSCULAR | Status: DC | PRN
  Administered 2021-01-17: 20 mL

## 2021-01-17 MED ORDER — METOCLOPRAMIDE HCL 5 MG PO TABS: 5.0000 mg | ORAL_TABLET | Freq: Three times a day (TID) | ORAL | Status: DC | PRN

## 2021-01-17 MED ORDER — PROPOFOL 10 MG/ML IV BOLUS
INTRAVENOUS | Status: AC
  Filled 2021-01-17: qty 20

## 2021-01-17 MED ORDER — MORPHINE SULFATE (PF) 4 MG/ML IV SOLN
INTRAVENOUS | Status: AC
  Filled 2021-01-17: qty 2

## 2021-01-17 MED ORDER — POVIDONE-IODINE 7.5 % EX SOLN
Freq: Once | CUTANEOUS | Status: DC
  Filled 2021-01-17: qty 118

## 2021-01-17 MED ORDER — CHLORHEXIDINE GLUCONATE 0.12 % MT SOLN: 15.0000 mL | Freq: Once | OROMUCOSAL | Status: AC

## 2021-01-17 MED ORDER — PROPOFOL 10 MG/ML IV BOLUS
INTRAVENOUS | Status: DC | PRN
  Administered 2021-01-17 (×2): 20 mg via INTRAVENOUS

## 2021-01-17 MED ORDER — SODIUM CHLORIDE 0.9 % IR SOLN
Status: DC | PRN
  Administered 2021-01-17: 3000 mL

## 2021-01-17 MED ORDER — DEXAMETHASONE SODIUM PHOSPHATE 10 MG/ML IJ SOLN
INTRAMUSCULAR | Status: DC | PRN
  Administered 2021-01-17: 10 mg

## 2021-01-17 MED ORDER — BUPIVACAINE IN DEXTROSE 0.75-8.25 % IT SOLN
INTRATHECAL | Status: DC | PRN
  Administered 2021-01-17: 2 mL via INTRATHECAL

## 2021-01-17 MED ORDER — MIDAZOLAM HCL 5 MG/5ML IJ SOLN
INTRAMUSCULAR | Status: DC | PRN
  Administered 2021-01-17: 2 mg via INTRAVENOUS

## 2021-01-17 MED ORDER — ONDANSETRON HCL 4 MG/2ML IJ SOLN: 4.0000 mg | Freq: Four times a day (QID) | INTRAMUSCULAR | Status: DC | PRN

## 2021-01-17 MED ORDER — HYDROMORPHONE HCL 1 MG/ML IJ SOLN
0.5000 mg | INTRAMUSCULAR | Status: DC | PRN
  Administered 2021-01-17 (×3): 1 mg via INTRAVENOUS
  Filled 2021-01-17 (×2): qty 1

## 2021-01-17 MED ORDER — MIDAZOLAM HCL 2 MG/2ML IJ SOLN
INTRAMUSCULAR | Status: AC
  Filled 2021-01-17: qty 2

## 2021-01-17 MED ORDER — VANCOMYCIN HCL 1000 MG IV SOLR
INTRAVENOUS | Status: DC | PRN
  Administered 2021-01-17: 1000 mg

## 2021-01-17 MED ORDER — ONDANSETRON HCL 4 MG PO TABS: 4.0000 mg | ORAL_TABLET | Freq: Four times a day (QID) | ORAL | Status: DC | PRN

## 2021-01-17 MED ORDER — TRANEXAMIC ACID-NACL 1000-0.7 MG/100ML-% IV SOLN
1000.0000 mg | INTRAVENOUS | Status: AC
  Administered 2021-01-17: 1000 mg via INTRAVENOUS
  Filled 2021-01-17: qty 100

## 2021-01-17 MED ORDER — METHOCARBAMOL 500 MG PO TABS: 500.0000 mg | ORAL_TABLET | Freq: Four times a day (QID) | ORAL | Status: DC | PRN

## 2021-01-17 MED ORDER — POVIDONE-IODINE 10 % EX SWAB: 2.0000 "application " | Freq: Once | CUTANEOUS | Status: DC

## 2021-01-17 MED ORDER — DOCUSATE SODIUM 100 MG PO CAPS
100.0000 mg | ORAL_CAPSULE | Freq: Two times a day (BID) | ORAL | Status: DC
  Administered 2021-01-17 – 2021-01-18 (×2): 100 mg via ORAL
  Filled 2021-01-17 (×2): qty 1

## 2021-01-17 MED ORDER — CEFAZOLIN SODIUM-DEXTROSE 2-4 GM/100ML-% IV SOLN
2.0000 g | Freq: Four times a day (QID) | INTRAVENOUS | Status: AC
  Administered 2021-01-17 (×2): 2 g via INTRAVENOUS
  Filled 2021-01-17 (×2): qty 100

## 2021-01-17 MED ORDER — ACETAMINOPHEN 500 MG PO TABS
1000.0000 mg | ORAL_TABLET | Freq: Four times a day (QID) | ORAL | Status: DC
  Administered 2021-01-17 – 2021-01-18 (×3): 1000 mg via ORAL
  Filled 2021-01-17 (×3): qty 2

## 2021-01-17 MED ORDER — MELOXICAM 7.5 MG PO TABS
15.0000 mg | ORAL_TABLET | Freq: Every day | ORAL | Status: DC
  Administered 2021-01-18: 15 mg via ORAL
  Filled 2021-01-17: qty 2

## 2021-01-17 MED ORDER — CLONIDINE HCL (ANALGESIA) 100 MCG/ML EP SOLN
EPIDURAL | Status: AC
  Filled 2021-01-17: qty 10

## 2021-01-17 MED ORDER — METOCLOPRAMIDE HCL 5 MG/ML IJ SOLN: 5.0000 mg | Freq: Three times a day (TID) | INTRAMUSCULAR | Status: DC | PRN

## 2021-01-17 MED ORDER — BUPIVACAINE HCL 0.25 % IJ SOLN
INTRAMUSCULAR | Status: DC | PRN
  Administered 2021-01-17: 20 mL
  Administered 2021-01-17: 10 mL

## 2021-01-17 MED ORDER — ONDANSETRON HCL 4 MG/2ML IJ SOLN
INTRAMUSCULAR | Status: DC | PRN
  Administered 2021-01-17: 4 mg via INTRAVENOUS

## 2021-01-17 MED ORDER — PHENOL 1.4 % MT LIQD: 1.0000 | OROMUCOSAL | Status: DC | PRN

## 2021-01-17 MED ORDER — DULOXETINE HCL 20 MG PO CPEP
20.0000 mg | ORAL_CAPSULE | Freq: Two times a day (BID) | ORAL | Status: DC
  Administered 2021-01-17 – 2021-01-18 (×2): 20 mg via ORAL
  Filled 2021-01-17 (×3): qty 1

## 2021-01-17 MED ORDER — MORPHINE SULFATE (PF) 4 MG/ML IV SOLN
INTRAVENOUS | Status: DC | PRN
  Administered 2021-01-17: 8 mg via INTRAVENOUS

## 2021-01-17 MED ORDER — FENTANYL CITRATE (PF) 250 MCG/5ML IJ SOLN
INTRAMUSCULAR | Status: AC
  Filled 2021-01-17: qty 5

## 2021-01-17 MED ORDER — OXYCODONE HCL 5 MG PO TABS
5.0000 mg | ORAL_TABLET | ORAL | Status: DC | PRN
Start: 2021-01-17 — End: 2021-01-18
  Administered 2021-01-18 (×2): 10 mg via ORAL
  Filled 2021-01-17 (×2): qty 2

## 2021-01-17 MED ORDER — ACETAMINOPHEN 10 MG/ML IV SOLN
INTRAVENOUS | Status: DC | PRN
  Administered 2021-01-17: 1000 mg via INTRAVENOUS

## 2021-01-17 MED ORDER — CEFAZOLIN IN SODIUM CHLORIDE 3-0.9 GM/100ML-% IV SOLN
3.0000 g | INTRAVENOUS | Status: AC
  Administered 2021-01-17: 3 g via INTRAVENOUS
  Filled 2021-01-17: qty 100

## 2021-01-17 MED ORDER — PROPOFOL 500 MG/50ML IV EMUL
INTRAVENOUS | Status: DC | PRN
  Administered 2021-01-17: 100 ug/kg/min via INTRAVENOUS
  Administered 2021-01-17: 90 ug/kg/min via INTRAVENOUS

## 2021-01-17 MED ORDER — ORAL CARE MOUTH RINSE: 15.0000 mL | Freq: Once | OROMUCOSAL | Status: AC

## 2021-01-17 MED ORDER — IRRISEPT - 450ML BOTTLE WITH 0.05% CHG IN STERILE WATER, USP 99.95% OPTIME
TOPICAL | Status: DC | PRN
  Administered 2021-01-17: 450 mL via TOPICAL

## 2021-01-17 MED ORDER — VANCOMYCIN HCL 1000 MG IV SOLR
INTRAVENOUS | Status: AC
  Filled 2021-01-17: qty 1000

## 2021-01-17 MED ORDER — BUPIVACAINE LIPOSOME 1.3 % IJ SUSP
INTRAMUSCULAR | Status: AC
  Filled 2021-01-17: qty 20

## 2021-01-17 MED ORDER — 0.9 % SODIUM CHLORIDE (POUR BTL) OPTIME
TOPICAL | Status: DC | PRN
  Administered 2021-01-17 (×4): 1000 mL

## 2021-01-17 MED ORDER — FENTANYL CITRATE (PF) 250 MCG/5ML IJ SOLN
INTRAMUSCULAR | Status: DC | PRN
  Administered 2021-01-17 (×2): 50 ug via INTRAVENOUS

## 2021-01-17 MED ORDER — ONDANSETRON HCL 4 MG/2ML IJ SOLN
INTRAMUSCULAR | Status: AC
  Filled 2021-01-17: qty 2

## 2021-01-17 MED ORDER — SODIUM CHLORIDE 0.9% FLUSH
INTRAVENOUS | Status: DC | PRN
  Administered 2021-01-17: 20 mL

## 2021-01-17 MED ORDER — DEXAMETHASONE SODIUM PHOSPHATE 10 MG/ML IJ SOLN
INTRAMUSCULAR | Status: AC
  Filled 2021-01-17: qty 1

## 2021-01-17 MED ORDER — METHOCARBAMOL 1000 MG/10ML IJ SOLN
500.0000 mg | Freq: Four times a day (QID) | INTRAMUSCULAR | Status: DC | PRN
  Filled 2021-01-17: qty 5

## 2021-01-17 MED ORDER — CHLORHEXIDINE GLUCONATE 0.12 % MT SOLN
OROMUCOSAL | Status: AC
  Administered 2021-01-17: 15 mL via OROMUCOSAL
  Filled 2021-01-17: qty 15

## 2021-01-17 MED ORDER — MENTHOL 3 MG MT LOZG: 1.0000 | LOZENGE | OROMUCOSAL | Status: DC | PRN

## 2021-01-17 SURGICAL SUPPLY — 80 items
BAG DECANTER FOR FLEXI CONT (MISCELLANEOUS) ×2 IMPLANT
BANDAGE ESMARK 6X9 LF (GAUZE/BANDAGES/DRESSINGS) ×1 IMPLANT
BLADE SAG 18X100X1.27 (BLADE) ×2 IMPLANT
BNDG COHESIVE 6X5 TAN STRL LF (GAUZE/BANDAGES/DRESSINGS) ×2 IMPLANT
BNDG ELASTIC 6X10 VLCR STRL LF (GAUZE/BANDAGES/DRESSINGS) ×2 IMPLANT
BNDG ELASTIC 6X15 VLCR STRL LF (GAUZE/BANDAGES/DRESSINGS) ×2 IMPLANT
BNDG ESMARK 6X9 LF (GAUZE/BANDAGES/DRESSINGS) ×2
BOWL SMART MIX CTS (DISPOSABLE) IMPLANT
BSPLAT TIB 6 KN TRITANIUM (Knees) ×1 IMPLANT
CNTNR URN SCR LID CUP LEK RST (MISCELLANEOUS) ×1 IMPLANT
COMP FEM SZ5 CRUC LEFT RETAIN (Orthopedic Implant) ×2 IMPLANT
COMPONENT FEM SZ5 CRU LT RETN (Orthopedic Implant) ×1 IMPLANT
CONT SPEC 4OZ STRL OR WHT (MISCELLANEOUS) ×2
COOLER ICEMAN CLASSIC (MISCELLANEOUS) ×2 IMPLANT
COVER SURGICAL LIGHT HANDLE (MISCELLANEOUS) ×2 IMPLANT
COVER WAND RF STERILE (DRAPES) ×2 IMPLANT
CUFF TOURN SGL QUICK 34 (TOURNIQUET CUFF) ×2
CUFF TOURN SGL QUICK 42 (TOURNIQUET CUFF) IMPLANT
CUFF TRNQT CYL 34X4.125X (TOURNIQUET CUFF) ×1 IMPLANT
DECANTER SPIKE VIAL GLASS SM (MISCELLANEOUS) ×2 IMPLANT
DRAPE INCISE IOBAN 66X45 STRL (DRAPES) IMPLANT
DRAPE ORTHO SPLIT 77X108 STRL (DRAPES) ×6
DRAPE SURG ORHT 6 SPLT 77X108 (DRAPES) ×3 IMPLANT
DRAPE U-SHAPE 47X51 STRL (DRAPES) ×2 IMPLANT
DRSG AQUACEL AG ADV 3.5X14 (GAUZE/BANDAGES/DRESSINGS) ×2 IMPLANT
DURAPREP 26ML APPLICATOR (WOUND CARE) ×4 IMPLANT
ELECT CAUTERY BLADE 6.4 (BLADE) ×2 IMPLANT
ELECT REM PT RETURN 9FT ADLT (ELECTROSURGICAL) ×2
ELECTRODE REM PT RTRN 9FT ADLT (ELECTROSURGICAL) ×1 IMPLANT
GAUZE SPONGE 4X4 12PLY STRL (GAUZE/BANDAGES/DRESSINGS) ×2 IMPLANT
GLOVE ECLIPSE 7.0 STRL STRAW (GLOVE) ×2 IMPLANT
GLOVE ECLIPSE 8.0 STRL XLNG CF (GLOVE) ×2 IMPLANT
GLOVE SRG 8 PF TXTR STRL LF DI (GLOVE) ×1 IMPLANT
GLOVE SURG UNDER POLY LF SZ7 (GLOVE) ×2 IMPLANT
GLOVE SURG UNDER POLY LF SZ8 (GLOVE) ×2
GOWN STRL REUS W/ TWL LRG LVL3 (GOWN DISPOSABLE) ×3 IMPLANT
GOWN STRL REUS W/TWL LRG LVL3 (GOWN DISPOSABLE) ×6
HANDPIECE INTERPULSE COAX TIP (DISPOSABLE) ×2
HOOD PEEL AWAY FLYTE STAYCOOL (MISCELLANEOUS) ×6 IMPLANT
IMMOBILIZER KNEE 20 (SOFTGOODS)
IMMOBILIZER KNEE 20 THIGH 36 (SOFTGOODS) IMPLANT
IMMOBILIZER KNEE 22 UNIV (SOFTGOODS) IMPLANT
IMMOBILIZER KNEE 24 THIGH 36 (MISCELLANEOUS) IMPLANT
IMMOBILIZER KNEE 24 UNIV (MISCELLANEOUS)
INSERT TRIATH CS SZ6 11 (Insert) ×2 IMPLANT
KIT BASIN OR (CUSTOM PROCEDURE TRAY) ×2 IMPLANT
KIT TURNOVER KIT B (KITS) ×2 IMPLANT
KNEE PATELLA ASYMMETRIC 10X35 (Knees) ×2 IMPLANT
KNEE TIBIAL COMPONENT SZ6 (Knees) ×2 IMPLANT
MANIFOLD NEPTUNE II (INSTRUMENTS) ×2 IMPLANT
NEEDLE 18GX1X1/2 (RX/OR ONLY) (NEEDLE) ×2 IMPLANT
NEEDLE 22X1 1/2 (OR ONLY) (NEEDLE) ×4 IMPLANT
NEEDLE SPNL 18GX3.5 QUINCKE PK (NEEDLE) ×2 IMPLANT
NS IRRIG 1000ML POUR BTL (IV SOLUTION) ×4 IMPLANT
PACK TOTAL JOINT (CUSTOM PROCEDURE TRAY) ×2 IMPLANT
PAD ARMBOARD 7.5X6 YLW CONV (MISCELLANEOUS) ×4 IMPLANT
PAD CAST 4YDX4 CTTN HI CHSV (CAST SUPPLIES) ×1 IMPLANT
PAD COLD SHLDR WRAP-ON (PAD) ×2 IMPLANT
PADDING CAST COTTON 4X4 STRL (CAST SUPPLIES) ×2
PADDING CAST COTTON 6X4 STRL (CAST SUPPLIES) ×2 IMPLANT
PIN FLUTED HEDLESS FIX 3.5X1/8 (PIN) ×2 IMPLANT
SET HNDPC FAN SPRY TIP SCT (DISPOSABLE) ×1 IMPLANT
STRIP CLOSURE SKIN 1/2X4 (GAUZE/BANDAGES/DRESSINGS) ×4 IMPLANT
SUCTION FRAZIER HANDLE 10FR (MISCELLANEOUS) ×2
SUCTION TUBE FRAZIER 10FR DISP (MISCELLANEOUS) ×1 IMPLANT
SUT MNCRL AB 3-0 PS2 18 (SUTURE) ×2 IMPLANT
SUT VIC AB 0 CT1 27 (SUTURE) ×6
SUT VIC AB 0 CT1 27XBRD ANBCTR (SUTURE) ×3 IMPLANT
SUT VIC AB 1 CT1 27 (SUTURE) ×10
SUT VIC AB 1 CT1 27XBRD ANBCTR (SUTURE) ×5 IMPLANT
SUT VIC AB 2-0 CT1 27 (SUTURE) ×8
SUT VIC AB 2-0 CT1 TAPERPNT 27 (SUTURE) ×4 IMPLANT
SYR 30ML LL (SYRINGE) ×6 IMPLANT
SYR 5ML LL (SYRINGE) ×2 IMPLANT
SYR TB 1ML LUER SLIP (SYRINGE) ×4 IMPLANT
TOWEL GREEN STERILE (TOWEL DISPOSABLE) ×4 IMPLANT
TOWEL GREEN STERILE FF (TOWEL DISPOSABLE) ×4 IMPLANT
TRAY CATH 16FR W/PLASTIC CATH (SET/KITS/TRAYS/PACK) IMPLANT
TRAY CATH INTERMITTENT SS 16FR (CATHETERS) ×2 IMPLANT
WATER STERILE IRR 1000ML POUR (IV SOLUTION) IMPLANT

## 2021-01-17 NOTE — Op Note (Signed)
NAME: CLARK, CUFF MEDICAL RECORD NO: [id number] ACCOUNT NO: [id number] DATE OF BIRTH: 1960/11/27 FACILITY: [facility] LOCATION: [facility] PHYSICIAN: Yetta Barre. Marlou Sa, MD  Operative Report   DATE OF PROCEDURE: 01/17/2021  PREOPERATIVE DIAGNOSIS:  Left knee arthritis.  POSTOPERATIVE DIAGNOSIS:  Left knee arthritis.  PROCEDURE:  Left total knee replacement using Stryker press-fit components, size 6 tibia, posterior cruciate retaining size 11 deep-dish polyethylene insert with size 5 posterior cruciate retaining femur press-fit, and 35 mm press-fit patella with 11 mm  polyethylene deep-dish insert.  SURGEON:  Meredith Pel, MD  ASSISTANT:  Annie Main, PA  INDICATIONS:  The patient is a 60 year old patient with end-stage left knee arthritis who presents for operative management after explanation of risks and benefits.  DESCRIPTION OF PROCEDURE:  The patient was brought to the operating room where spinal anesthetic was induced.  Preoperative antibiotics were administered.  Timeout was called.  The patient's left leg was pre-scrubbed with alcohol and Betadine, allowed to  air dry, prepped with DuraPrep solution, and draped in a sterile manner.  Charlie Pitter was used to cover the operative field.  Leg was elevated, exsanguinated with the Esmarch wrap, and tourniquet inflated to 300 mmHg.  Anterior approach to knee was made.   Skin and subcutaneous tissue were sharply divided.  IrriSept solution utilized in the incision.  Medial parapatellar arthrotomy was made, marked with #1 Vicryl suture.  IrriSept solution was used after the arthrotomy as well.  Fat pad partially excised.   Lateral patellofemoral ligament was released.  Precise location of the arthrotomy was marked with a #1 Vicryl suture.  Soft tissue from the anterior distal femur was removed.  At this time, the patella was everted.  Soft tissue minimal dissection was  performed on the medial side of the knee.  The patient had only slight varus  deformity preoperatively.  No flexion contracture.  ACL was released.  Anterior horn of the lateral meniscus was released.  Retractors were placed.  Intramedullary alignment was  then used to make a cut perpendicular to the mechanical axis 9 mm off the least affected lateral tibial plateau.  Patellar tendon, collaterals, and posterior neurovascular structures were protected.  Cut was made.  Bone quality was excellent.  Intramedullary alignment then used to make a cut at 5 degrees valgus off the femur.  An 8 mm cut was made here to preserve  bone stock in this young patient.  Femur sized to a size 5.  Tibia sized to a size 6.  Anterior, posterior, chamfer cuts were made in 3 degrees of external rotation, which gave symmetric flexion and extension gaps.  These gaps were checked with an 11 mm  spacer and found to be symmetric after making these cuts.  Next, tibia was keel punched.  Trial components were placed with the femur and the tibia.  With an 11 mm spacer, the patient achieved full extension, excellent flexion.  Patella was then cut down  from 27 to 16, and a 35, 3-peg patella was placed.  Patella had excellent tracking with no thumbs technique.  Alignment was intact.  No liftoff with flexion.  Collaterals were stable at 0, 30, and 90 degrees with varus and valgus stress.  Trial  components were removed.  Three liters of irrigating solution performed.  Tranexamic acid sponge allowed to sit in the incision along with IrriSept solution for 3 minutes.  This was removed and the components were press fit into position with same  stability parameters maintained.  Tourniquet was  released. Bleeding points encountered were controlled using electrocautery.  Thorough irrigation with pouring irrigation again performed.  The arthrotomy was then closed nearly completely using #1 Vicryl  suture.  Next, IrriSept solution used to irrigate the arthrotomy through a small opening in the quad tendon.  Vancomycin powder  placed and this was then closed.  Solution of Marcaine, morphine, clonidine then injected into the arthrotomy through the quad  tendon.  The remaining incision was closed using #1 Vicryl suture followed by interrupted inverted 0 Vicryl, 2-0 Vicryl suture, and 3-0 Monocryl followed by Steri-Strips, Aquacel dressing, bulky dressing, and Iceman.  Knee immobilizer.  Luke's assistance was required at all times for retraction, opening, closing, mobilization of tissue.  His assistance was medical necessity.   [facility] D: 01/17/2021 10:33:28 am T: 01/17/2021 11:06:00 am  JOB: [id number]/ [id number]

## 2021-01-17 NOTE — Anesthesia Procedure Notes (Signed)
Spinal  Patient location during procedure: OR Start time: 01/17/2021 7:48 AM End time: 01/17/2021 7:51 AM Reason for block: surgical anesthesia Staffing Performed: anesthesiologist  Anesthesiologist: Lyn Hollingshead, MD Preanesthetic Checklist Completed: patient identified, IV checked, site marked, risks and benefits discussed, surgical consent, monitors and equipment checked, pre-op evaluation and timeout performed Spinal Block Patient position: sitting Prep: DuraPrep and site prepped and draped Patient monitoring: continuous pulse ox and blood pressure Approach: midline Location: L3-4 Injection technique: single-shot Needle Needle type: Pencan  Needle gauge: 24 G Needle length: 10 cm Needle insertion depth: 7 cm Assessment Sensory level: T8 Events: CSF return

## 2021-01-17 NOTE — Anesthesia Postprocedure Evaluation (Signed)
Anesthesia Post Note  Patient: Nicholas Horn  Procedure(s) Performed: LEFT TOTAL KNEE ARTHROPLASTY (Left: Knee)     Patient location during evaluation: PACU Anesthesia Type: Spinal Level of consciousness: sedated Pain management: pain level controlled Vital Signs Assessment: post-procedure vital signs reviewed and stable Respiratory status: spontaneous breathing Cardiovascular status: stable Postop Assessment: no headache, no backache, spinal receding, patient able to bend at knees and no apparent nausea or vomiting Anesthetic complications: no   No notable events documented.  Last Vitals:  Vitals:   01/17/21 1050 01/17/21 1105  BP: 127/79 132/83  Pulse: 64 67  Resp: 18 14  Temp:    SpO2: 92% 93%    Last Pain:  Vitals:   01/17/21 1105  TempSrc:   PainSc: [name]

## 2021-01-17 NOTE — Transfer of Care (Signed)
Immediate Anesthesia Transfer of Care Note  Patient: Nicholas Horn  Procedure(s) Performed: LEFT TOTAL KNEE ARTHROPLASTY (Left: Knee)  Patient Location: PACU  Anesthesia Type:Spinal MAC with regional.  Level of Consciousness: awake, alert  and oriented  Airway & Oxygen Therapy: Patient Spontanous Breathing  Post-op Assessment: Report given to RN and Post -op Vital signs reviewed and stable  Post vital signs: Reviewed and stable  Last Vitals:  Vitals Value Taken Time  BP 137/95 01/17/21 1035  Temp    Pulse 67 01/17/21 1035  Resp 14 01/17/21 1035  SpO2 96 % 01/17/21 1035  Vitals shown include unvalidated device data.  Last Pain:  Vitals:   01/17/21 0616  TempSrc:   PainSc: 6          Complications: No notable events documented.

## 2021-01-17 NOTE — H&P (Signed)
TOTAL KNEE ADMISSION H&P  Patient is being admitted for left total knee arthroplasty.  Subjective:  Chief Complaint:left knee pain.  HPI: Nicholas Horn, 60 y.o. male, has a history of pain and functional disability in the left knee due to arthritis and has failed non-surgical conservative treatments for greater than 12 weeks to includeNSAID's and/or analgesics, corticosteriod injections, viscosupplementation injections, flexibility and strengthening excercises, and activity modification.  Onset of symptoms was gradual, starting 6 years ago with gradually worsening course since that time. The patient noted prior procedures on the knee to include  arthroscopy and menisectomy on the left knee(s).  Patient currently rates pain in the left knee(s) at 9 out of 10 with activity. Patient has night pain, worsening of pain with activity and weight bearing, pain that interferes with activities of daily living, pain with passive range of motion, crepitus, and joint swelling.  Patient has evidence of subchondral sclerosis and joint space narrowing by imaging studies. This patient has had  long history of treatment which has helped.  He reports.  Activities of daily living as well as work.  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 . There is no active infection.  Patient Active Problem List   Diagnosis Date Noted   Primary osteoarthritis of left knee 10/08/2016   Pain in right hand 09/21/2016   Closed nondisplaced fracture of proximal phalanx of right middle finger 08/24/2016   Right hip pain 08/24/2016   S/P laparoscopic appendectomy 10/02/2014   Left knee pain 07/01/2013   Morbid obesity ([place]) 01/23/2013   Left lumbar radiculopathy 01/23/2013   Heel pain 01/23/2013   HYPERLIPIDEMIA 09/29/2008   SLEEP APNEA, OBSTRUCTIVE 09/29/2008   HEADACHE, SEVERE 09/29/2008   Past Medical History:  Diagnosis Date   Arthritis    spine   Back pain,  chronic    EKG abnormality 2007   stress test and cardiac evaluation - [facility]   History of kidney stones    Hyperlipidemia    no meds, diet controlled   Kidney stones 2017   passed stones   Shortness of breath    with exertion   Sleep apnea 2007   Does not use CPAP   Vision impairment    wears reading glasses    Past Surgical History:  Procedure Laterality Date   APPENDECTOMY     BACK SURGERY  2014   Micro disectomy L3-4   CARDIAC CATHETERIZATION  2007   due to equivocal stress test.  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 Right 07/15/2013   Procedure: Resection right Haglund Deformity;  Surgeon: Newt Minion, MD;  Location: [facility];  Service: Orthopedics;  Laterality: Right;  Resection right Haglund Deformity   KNEE ARTHROSCOPY Left 2014   KNEE SURGERY     LAPAROSCOPIC APPENDECTOMY N/A 10/01/2014   Procedure: APPENDECTOMY LAPAROSCOPIC;  Surgeon: Georganna Skeans, MD;  Location: [facility];  Service: General;  Laterality: N/A;   LITHOTRIPSY     kidney stone    WISDOM TOOTH EXTRACTION     age 34yo    Current Facility-Administered Medications  Medication Dose Route Frequency Provider Last Rate Last Admin   ceFAZolin (ANCEF) IVPB 3g/100 mL premix  3 g Intravenous On Call to Ethete, Tonna Corner, MD       lactated ringers infusion   Intravenous Continuous Lyn Hollingshead, MD   New Bag at  01/17/21 0650   povidone-iodine (BETADINE) 7.5 % scrub   Topical Once Magnant, Charles L, PA-C       povidone-iodine 10 % swab 2 application  2 application Topical Once Magnant, Charles L, PA-C       povidone-iodine 10 % swab 2 application  2 application Topical Once Magnant, Charles L, PA-C       tranexamic acid (CYKLOKAPRON) 2,000 mg in sodium chloride 0.9 % 50 mL Topical Application  1,062 mg Topical To OR Marlou Sa, Tonna Corner, MD       tranexamic acid (CYKLOKAPRON) IVPB 1,000 mg  1,000 mg Intravenous To OR Magnant,  Charles L, PA-C       Facility-Administered Medications Ordered in Other Encounters  Medication Dose Route Frequency Provider Last Rate Last Admin   fentaNYL citrate (PF) (SUBLIMAZE) injection   Intravenous Anesthesia Intra-op Candis Shine, CRNA   50 mcg at 01/17/21 0704   midazolam (VERSED) 5 MG/5ML injection   Intravenous Anesthesia Intra-op Candis Shine, CRNA   2 mg at 01/17/21 6948   Allergies  Allergen Reactions   Honey Nausea Only    GI upset    Social History   Tobacco Use   Smoking status: Former    Years: 7.00    Pack years: 0.00    Types: Cigarettes    Start date: 09/06/1985   Smokeless tobacco: Never   Tobacco comments:    quit in 1987  Substance Use Topics   Alcohol use: No    Family History  Problem Relation Age of Onset   Heart disease Mother 44       died of heart disease, age 31yo   Obesity Mother    Hypertension Mother    Hyperlipidemia Mother    Cancer Father        died of stomach and intestinal cancer   Heart disease Brother        CAD, stent   Other Brother        substance abuse   Diabetes Neg Hx    Stroke Neg Hx    Colon cancer Neg Hx      Review of Systems  Musculoskeletal:  Positive for arthralgias.  All other systems reviewed and are negative.  Objective:  Physical Exam Vitals reviewed.  HENT:     Head: Normocephalic.     Nose: Nose normal.     Mouth/Throat:     Mouth: Mucous membranes are moist.  Eyes:     Pupils: Pupils are equal, round, and reactive to light.  Cardiovascular:     Rate and Rhythm: Normal rate.     Pulses: Normal pulses.  Pulmonary:     Effort: Pulmonary effort is normal.  Abdominal:     General: Abdomen is flat.  Musculoskeletal:     Cervical back: Normal range of motion.  Skin:    General: Skin is warm.     Capillary Refill: Capillary refill takes less than 2 seconds.  Neurological:     General: No focal deficit present.     Mental Status: He is alert.  Psychiatric:        Mood and Affect:  Mood normal.  Ortho exam demonstrates palpable pedal pulses.  No pitting edema bilateral lower extremities.  No groin pain on the left or right hand side with internal extra rotation of the leg.  Gait is antalgic to the left but not Trendelenburg.  Patient has 5 out of 5 ankle dorsiflexion plantarflexion abduction adduction hip flexion and  quad and hamstring strength bilaterally.  No paresthesias L1-S1 bilaterally.  Left knee is examined.  Slight varus alignment is present.  Extensor mechanism is intact.  Collateral cruciate ligaments are stable.  5 degree flexion contracture is present but flexion is to about 125.  Skin is intact in that left knee region.  Radiographs do show medial joint space narrowing with sclerosis as well as mild to moderate patellofemoral arthritis  Vital signs in last 24 hours: Temp:  [98.8 F (37.1 C)] 98.8 F (37.1 C) (06/14 0553) Pulse Rate:  [69-88] 88 (06/14 0722) Resp:  [10-20] 14 (06/14 0721) BP: (169-178)/(93-106) 172/106 (06/14 0714) SpO2:  [93 %-98 %] 97 % (06/14 0722) Weight:  [138.3 kg] 138.3 kg (06/14 0553)  Labs:   Estimated body mass index is 39.16 kg/m as calculated from the following:   Height as of this encounter: 6\' 2"  (1.88 m).   Weight as of this encounter: 138.3 kg.   Imaging Review Plain radiographs demonstrate moderate degenerative joint disease of the left knee(s). The overall alignment ismild varus. The bone quality appears to be excellent for age and reported activity level.      Assessment/Plan:  End stage arthritis, left knee   The patient history, physical examination, clinical judgment of the provider and imaging studies are consistent with end stage degenerative joint disease of the left knee(s) and total knee arthroplasty is deemed medically necessary. The treatment options including medical management, injection therapy arthroscopy and arthroplasty were discussed at length. The risks and benefits of total knee arthroplasty  were presented and reviewed. The risks due to aseptic loosening, infection, stiffness, patella tracking problems, thromboembolic complications and other imponderables were discussed. The patient acknowledged the explanation, agreed to proceed with the plan and consent was signed. Patient is being admitted for inpatient treatment for surgery, pain control, PT, OT, prophylactic antibiotics, VTE prophylaxis, progressive ambulation and ADL's and discharge planning. The patient is planning to be discharged home with home health services     Patient's anticipated LOS is less than 2 midnights, meeting these requirements: - Younger than 89 - Lives within 1 hour of care - Has a competent adult at home to recover with post-op recover - NO history of  - Chronic pain requiring opiods  - Diabetes  - Coronary Artery Disease  - Heart failure  - Heart attack  - Stroke  - DVT/VTE  - Cardiac arrhythmia  - Respiratory Failure/COPD  - Renal failure  - Anemia  - Advanced Liver disease

## 2021-01-17 NOTE — Anesthesia Procedure Notes (Signed)
Anesthesia Regional Block: Adductor canal block   Pre-Anesthetic Checklist: , timeout performed,  Correct Patient, Correct Site, Correct Laterality,  Correct Procedure, Correct Position, site marked,  Risks and benefits discussed,  Surgical consent,  Pre-op evaluation,  At surgeon's request and post-op pain management  Laterality: Lower and Left  Prep: chloraprep       Needles:  Injection technique: Single-shot  Needle Type: Echogenic Stimulator Needle     Needle Length: 9cm  Needle Gauge: 20   Needle insertion depth: 3.5 cm   Additional Needles:   Procedures:,,,, ultrasound used (permanent image in chart),,    Narrative:  Start time: 01/17/2021 7:06 AM End time: 01/17/2021 7:15 AM Injection made incrementally with aspirations every 5 mL.  Performed by: Personally  Anesthesiologist: Lyn Hollingshead, MD

## 2021-01-17 NOTE — Evaluation (Signed)
Physical Therapy Evaluation Patient Details Name: Nicholas Horn MRN: [id number] DOB: 1960-12-24 Today's Date: 01/17/2021   History of Present Illness  Pt is a 60 y/o male s/p L TKA on 6/14. PMH includes sleep apnea and back surgery.  Clinical Impression  Pt admitted secondary to problem above with deficits below. Pt requiring min guard A for mobility tasks using RW. Mild buckling noted secondary to block effects. Educated about knee precautions. Will continue to follow acutely to maximize functional mobility independence and safety.     Follow Up Recommendations Follow surgeon's recommendation for DC plan and follow-up therapies    Equipment Recommendations  None recommended by PT    Recommendations for Other Services       Precautions / Restrictions Precautions Precautions: Knee Precaution Booklet Issued: No Precaution Comments: Reviewed knee precautions with pt. Restrictions Weight Bearing Restrictions: Yes LLE Weight Bearing: Weight bearing as tolerated      Mobility  Bed Mobility Overal bed mobility: Needs Assistance Bed Mobility: Supine to Sit     Supine to sit: Supervision     General bed mobility comments: Supervision for safety.    Transfers Overall transfer level: Needs assistance Equipment used: Rolling walker (2 wheeled) Transfers: Sit to/from Stand Sit to Stand: Min guard;From elevated surface         General transfer comment: Cues for hand placement. Min guard for safety.  Ambulation/Gait Ambulation/Gait assistance: Min guard Gait Distance (Feet): 20 Feet Assistive device: Rolling walker (2 wheeled) Gait Pattern/deviations: Step-to pattern;Step-through pattern;Decreased step length - left;Decreased step length - right;Decreased weight shift to left;Antalgic Gait velocity: Decreased   General Gait Details: Mildly antalgic gait and noted mild buckling. Min guard for safety. Cues for sequencing using RW.  Stairs            Wheelchair  Mobility    Modified Rankin (Stroke Patients Only)       Balance Overall balance assessment: Needs assistance Sitting-balance support: No upper extremity supported;Feet supported Sitting balance-Leahy Scale: Fair     Standing balance support: Bilateral upper extremity supported;During functional activity Standing balance-Leahy Scale: Poor Standing balance comment: Reliant on BUE support                             Pertinent Vitals/Pain Pain Assessment: Faces Faces Pain Scale: Hurts little more Pain Location: L knee Pain Descriptors / Indicators: Aching;Operative site guarding Pain Intervention(s): Monitored during session;Limited activity within patient's tolerance;Repositioned    Home Living Family/patient expects to be discharged to:: Private residence Living Arrangements: Children Available Help at Discharge: Family Type of Home: House Home Access: Stairs to enter Entrance Stairs-Rails: None Entrance Stairs-Number of Steps: 3 Home Layout: One level Home Equipment: Environmental consultant - 2 wheels;Bedside commode      Prior Function Level of Independence: Independent               Hand Dominance        Extremity/Trunk Assessment   Upper Extremity Assessment Upper Extremity Assessment: Overall WFL for tasks assessed    Lower Extremity Assessment Lower Extremity Assessment: LLE deficits/detail LLE Deficits / Details: Deficits consistent with post op pain and weakness. Reports some numbness    Cervical / Trunk Assessment Cervical / Trunk Assessment: Normal  Communication   Communication: No difficulties  Cognition Arousal/Alertness: Awake/alert Behavior During Therapy: WFL for tasks assessed/performed Overall Cognitive Status: Within Functional Limits for tasks assessed  General Comments      Exercises     Assessment/Plan    PT Assessment Patient needs continued PT services  PT Problem  List Decreased strength;Decreased range of motion;Decreased activity tolerance;Decreased balance;Decreased mobility;Decreased knowledge of use of DME;Decreased knowledge of precautions;Pain       PT Treatment Interventions DME instruction;Stair training;Functional mobility training;Therapeutic activities;Gait training;Therapeutic exercise;Balance training;Patient/family education    PT Goals (Current goals can be found in the Care Plan section)  Acute Rehab PT Goals Patient Stated Goal: to go home PT Goal Formulation: With patient Time For Goal Achievement: 01/31/21 Potential to Achieve Goals: Good    Frequency 7X/week   Barriers to discharge        Co-evaluation               AM-PAC PT "6 Clicks" Mobility  Outcome Measure Help needed turning from your back to your side while in a flat bed without using bedrails?: None Help needed moving from lying on your back to sitting on the side of a flat bed without using bedrails?: None Help needed moving to and from a bed to a chair (including a wheelchair)?: A Little Help needed standing up from a chair using your arms (e.g., wheelchair or bedside chair)?: A Little Help needed to walk in hospital room?: A Little Help needed climbing 3-5 steps with a railing? : A Lot 6 Click Score: 19    End of Session Equipment Utilized During Treatment: Gait belt Activity Tolerance: Patient tolerated treatment well Patient left: in bed;with call bell/phone within reach;with bed alarm set Nurse Communication: Mobility status PT Visit Diagnosis: Other abnormalities of gait and mobility (R26.89);Pain Pain - Right/Left: Left Pain - part of body: Knee    Time: 5631-4970 PT Time Calculation (min) (ACUTE ONLY): 25 min   Charges:   PT Evaluation $PT Eval Low Complexity: 1 Low PT Treatments $Gait Training: 8-22 mins        Lou Miner, DPT  Acute Rehabilitation Services  Pager: [phone] Office: [phone]   Rudean Hitt 01/17/2021, 4:55 PM

## 2021-01-17 NOTE — Anesthesia Procedure Notes (Signed)
Procedure Name: MAC Date/Time: 01/17/2021 7:51 AM Performed by: Flossie Dibble, CRNA Pre-anesthesia Checklist: Patient identified, Emergency Drugs available, Suction available and Patient being monitored Patient Re-evaluated:Patient Re-evaluated prior to induction Oxygen Delivery Method: Simple face mask Dental Injury: Teeth and Oropharynx as per pre-operative assessment

## 2021-01-17 NOTE — Progress Notes (Signed)
Orthopedic Tech Progress Note Patient Details:  Nicholas Horn Oct 13, 1960 [id number] CPM applied by 1st shift Patient ID: Nicholas Horn, male   DOB: [birth year], 60 y.o.   MRN: [id number]  Nicholas Horn 01/17/2021, 7:57 PM

## 2021-01-17 NOTE — Brief Op Note (Signed)
   01/17/2021  10:28 AM  PATIENT:  Nicholas Horn  60 y.o. male  PRE-OPERATIVE DIAGNOSIS:  left knee osteoarthritis  POST-OPERATIVE DIAGNOSIS:  left knee osteoarthritis  PROCEDURE:  Procedure(s): LEFT TOTAL KNEE ARTHROPLASTY  SURGEON:  Surgeon(s): Meredith Pel, MD  ASSISTANT: Annie Main, PA  ANESTHESIA:   spinal  EBL: 50 ml    Total I/O In: 1000 [I.V.:1000] Out: 250 [Urine:200; Blood:50]  BLOOD ADMINISTERED: none  DRAINS: none   LOCAL MEDICATIONS USED: Marcaine morphine clonidine Exparel vancomycin powder  SPECIMEN:  No Specimen  COUNTS:  YES  TOURNIQUET:   Total Tourniquet Time Documented: Thigh (laterality) - 73 minutes Total: Thigh (laterality) - 73 minutes   DICTATION: .Other Dictation: Dictation Number [id number]  PLAN OF CARE: Admit for overnight observation  PATIENT DISPOSITION:  PACU - hemodynamically stable

## 2021-01-18 ENCOUNTER — Telehealth: Payer: Self-pay

## 2021-01-18 ENCOUNTER — Encounter (HOSPITAL_COMMUNITY): Payer: Self-pay | Admitting: Orthopedic Surgery

## 2021-01-18 DIAGNOSIS — M1712 Unilateral primary osteoarthritis, left knee: Secondary | ICD-10-CM | POA: Diagnosis not present

## 2021-01-18 MED ORDER — ASPIRIN 81 MG PO CHEW: 81.0000 mg | CHEWABLE_TABLET | Freq: Every day | ORAL | 0 refills | Status: DC

## 2021-01-18 MED ORDER — OXYCODONE-ACETAMINOPHEN 10-325 MG PO TABS: 1.0000 | ORAL_TABLET | Freq: Every day | ORAL | 0 refills | Status: DC | PRN

## 2021-01-18 MED ORDER — DOCUSATE SODIUM 100 MG PO CAPS: 100.0000 mg | ORAL_CAPSULE | Freq: Two times a day (BID) | ORAL | 0 refills | Status: DC

## 2021-01-18 NOTE — Telephone Encounter (Signed)
noted 

## 2021-01-18 NOTE — Progress Notes (Signed)
Physical Therapy Treatment Patient Details Name: Nicholas Horn MRN: [id number] DOB: [birth year] Today's Date: 01/18/2021    History of Present Illness Pt is a 60 y/o male s/p L TKA on 6/14. PMH includes sleep apnea and back surgery.    PT Comments    Pt reports mild to moderate post-operative L knee pain, but states he feels great overall and has been up and walking overnight with assist. Pt ambulatory in hallway with use of RW, for 200+ ft distance with supervision assist only. Pt encouraged continued use of RW given pt pain and to provide post-operative knee with support, pt agrees. Pt proficiently navigated 2x3 steps, demonstrating ability to enter home at d/c. All TKR exercises reviewed, practiced, and handout administered. Pt with no further questions, appropriate to d/c from acute setting from a PT standpoint.    Follow Up Recommendations  Follow surgeon's recommendation for DC plan and follow-up therapies;Supervision for mobility/OOB     Equipment Recommendations  None recommended by PT    Recommendations for Other Services       Precautions / Restrictions Precautions Precautions: Fall;Knee Precaution Comments: Reviewed no pillow under knee at rest Restrictions Weight Bearing Restrictions: No LLE Weight Bearing: Weight bearing as tolerated    Mobility  Bed Mobility Overal bed mobility: Needs Assistance Bed Mobility: Supine to Sit     Supine to sit: Supervision     General bed mobility comments: Supervision for safety.    Transfers Overall transfer level: Needs assistance Equipment used: Rolling walker (2 wheeled) Transfers: Sit to/from Stand Sit to Stand: From elevated surface;Supervision         General transfer comment: for safety, verbal cuing for hand placement when rising/sitting.  Ambulation/Gait Ambulation/Gait assistance: Supervision Gait Distance (Feet): 250 Feet Assistive device: Rolling walker (2 wheeled) Gait Pattern/deviations:  Step-through pattern;Decreased weight shift to left;Antalgic;Decreased stride length Gait velocity: Decreased   General Gait Details: supervision for safety, min cuing for sequencing.   Stairs Stairs: Yes Stairs assistance: Min guard Stair Management: No rails;Step to pattern;Forwards Number of Stairs: 3 (x2) General stair comments: min guard for safety, pt performing without AD or railing but will have HHA from sons at home. Verbal cuing for sequencing (ascending with RLE leading, descending with LLE leading)   Wheelchair Mobility    Modified Rankin (Stroke Patients Only)       Balance Overall balance assessment: Needs assistance Sitting-balance support: No upper extremity supported;Feet supported Sitting balance-Leahy Scale: Fair     Standing balance support: Bilateral upper extremity supported;During functional activity Standing balance-Leahy Scale: Fair Standing balance comment: can stand statically without support, requires AD for gait                            Cognition Arousal/Alertness: Awake/alert Behavior During Therapy: WFL for tasks assessed/performed Overall Cognitive Status: Within Functional Limits for tasks assessed                                        Exercises Total Joint Exercises Ankle Circles/Pumps: AROM;Both;10 reps;Supine Quad Sets: AROM;Left;5 reps;Supine Heel Slides: AAROM;Left;5 reps;Supine Hip ABduction/ADduction: AROM;Left;5 reps;Supine Straight Leg Raises: AROM;Left;5 reps;Supine Long Arc Quad: AROM;Left;5 reps;Seated Goniometric ROM: knee ROM in supine ext-flex 5-95*    General Comments        Pertinent Vitals/Pain Pain Assessment: 0-10 Pain Score: 4  Pain Location: L knee Pain  Descriptors / Indicators: Operative site guarding;Sore Pain Intervention(s): Limited activity within patient's tolerance;Monitored during session;Repositioned    Home Living                      Prior Function             PT Goals (current goals can now be found in the care plan section) Acute Rehab PT Goals Patient Stated Goal: to go home PT Goal Formulation: With patient Time For Goal Achievement: 01/31/21 Potential to Achieve Goals: Good Progress towards PT goals: Progressing toward goals    Frequency    7X/week      PT Plan Current plan remains appropriate    Co-evaluation              AM-PAC PT "6 Clicks" Mobility   Outcome Measure  Help needed turning from your back to your side while in a flat bed without using bedrails?: None Help needed moving from lying on your back to sitting on the side of a flat bed without using bedrails?: None Help needed moving to and from a bed to a chair (including a wheelchair)?: None Help needed standing up from a chair using your arms (e.g., wheelchair or bedside chair)?: None Help needed to walk in hospital room?: A Little Help needed climbing 3-5 steps with a railing? : A Little 6 Click Score: 22    End of Session Equipment Utilized During Treatment: Gait belt Activity Tolerance: Patient tolerated treatment well Patient left: in bed;with call bell/phone within reach;with bed alarm set Nurse Communication: Mobility status PT Visit Diagnosis: Other abnormalities of gait and mobility (R26.89);Pain Pain - Right/Left: Left Pain - part of body: Knee     Time: 4158-3094 PT Time Calculation (min) (ACUTE ONLY): 29 min  Charges:  $Gait Training: 8-22 mins $Therapeutic Exercise: 8-22 mins                     Stacie Glaze, PT DPT Acute Rehabilitation Services Pager [phone]  Office [phone]    [place] 01/18/2021, 10:20 AM

## 2021-01-18 NOTE — Telephone Encounter (Signed)
Please advise 

## 2021-01-18 NOTE — Progress Notes (Addendum)
  Subjective: Nicholas Horn is a 60 y.o. male s/p left TKA.  They are POD 1.  Pt's pain is controlled.  Pt denies numbness/tingling/weakness.  Pt has ambulated with little difficulty.   He feels he is able to ambulate even without the walker.  He denies any giving way of his leg, dizziness, lightheadedness, chest pain, shortness of breath.  His pain is well controlled and he feels ready for discharge home today.  Did well with physical therapy this morning and was able to ambulate 200+ feet with no assistance  Objective: Vital signs in last 24 hours: Temp:  [97.5 F (36.4 C)-98.6 F (37 C)] 98.6 F (37 C) (06/15 0811) Pulse Rate:  [64-81] 74 (06/15 0811) Resp:  [12-19] 19 (06/15 0811) BP: (112-125)/(66-81) 125/75 (06/15 0811) SpO2:  [93 %-98 %] 96 % (06/15 0811)  Intake/Output from previous day: 06/14 0701 - 06/15 0700 In: 1240 [P.O.:240; I.V.:1000] Out: 250 [Urine:200; Blood:50] Intake/Output this shift: No intake/output data recorded.  Exam:  No gross blood or drainage overlying the dressing 2+ DP pulse Sensation intact distally in the left foot Able to dorsiflex and plantarflex the left foot No calf tenderness.  Negative Homans' sign.  Able to perform straight leg raise with minimal extensor lag   Labs: No results for input(s): HGB in the last 72 hours. No results for input(s): WBC, RBC, HCT, PLT in the last 72 hours. No results for input(s): NA, K, CL, CO2, BUN, CREATININE, GLUCOSE, CALCIUM in the last 72 hours. No results for input(s): LABPT, INR in the last 72 hours.  Assessment/Plan: Pt is POD 1 s/p left TKA.    -Plan to discharge to home today now that he has passed physical therapy evaluation.  He has CPM machine at home that he will use per discharge instructions.  He was using CPM from 0 to 100 degrees in the hospital.  -WBAT with a walker  -Follow-up with Dr. Marlou Sa in 2 weeks     Donella Stade 01/18/2021, 2:37 PM

## 2021-01-18 NOTE — Telephone Encounter (Signed)
I already put in discharge orders, thanks

## 2021-01-18 NOTE — Telephone Encounter (Signed)
Pt called to let luke know he passed the pt at the hospital so he can be released. The nurse told the pt they are waiting on luke to sign off.  Please advise.

## 2021-01-18 NOTE — Progress Notes (Signed)
[name] to be D/C'd Home per MD order. Discussed with the patient and all questions fully answered.    IV catheter discontinued intact. Site without signs and symptoms of complications. Dressing and pressure applied.  An After Visit Summary was printed and given to the patient.  Patient escorted via [employer], and D/C home via private auto.  [employer] Numbers  01/18/2021

## 2021-01-21 DIAGNOSIS — M1710 Unilateral primary osteoarthritis, unspecified knee: Secondary | ICD-10-CM

## 2021-01-24 ENCOUNTER — Telehealth: Payer: Self-pay

## 2021-01-24 ENCOUNTER — Other Ambulatory Visit: Payer: Self-pay | Admitting: Surgical

## 2021-01-24 NOTE — Telephone Encounter (Signed)
Recommend he try miralax or dulcolax which should be OTC.  If that doesn't work, I can send in stronger RX

## 2021-01-24 NOTE — Telephone Encounter (Signed)
Patient called he is requesting a laxative to be called into the pharmacy call back:[phone] [id number] [place], [place] - [address]

## 2021-01-24 NOTE — Telephone Encounter (Signed)
IC advised. Patient verbalized understanding 

## 2021-01-27 ENCOUNTER — Other Ambulatory Visit: Payer: Self-pay | Admitting: Surgical

## 2021-01-27 ENCOUNTER — Telehealth: Payer: Self-pay

## 2021-01-27 MED ORDER — OXYCODONE-ACETAMINOPHEN 10-325 MG PO TABS: 1.0000 | ORAL_TABLET | Freq: Two times a day (BID) | ORAL | 0 refills | Status: DC | PRN

## 2021-01-27 NOTE — Telephone Encounter (Signed)
IC advised.  

## 2021-01-27 NOTE — Telephone Encounter (Signed)
Sent in rx.

## 2021-01-27 NOTE — Telephone Encounter (Signed)
Pt called and would like a refill on his hydrocodone

## 2021-02-01 ENCOUNTER — Telehealth: Payer: Self-pay | Admitting: Orthopedic Surgery

## 2021-02-01 ENCOUNTER — Ambulatory Visit (INDEPENDENT_AMBULATORY_CARE_PROVIDER_SITE_OTHER): Payer: Medicaid Other | Admitting: Orthopedic Surgery

## 2021-02-01 ENCOUNTER — Ambulatory Visit (INDEPENDENT_AMBULATORY_CARE_PROVIDER_SITE_OTHER): Payer: Medicaid Other

## 2021-02-01 ENCOUNTER — Other Ambulatory Visit: Payer: Self-pay

## 2021-02-01 DIAGNOSIS — Z96652 Presence of left artificial knee joint: Secondary | ICD-10-CM | POA: Diagnosis not present

## 2021-02-01 MED ORDER — OXYCODONE-ACETAMINOPHEN 10-325 MG PO TABS: 1.0000 | ORAL_TABLET | Freq: Four times a day (QID) | ORAL | 0 refills | Status: DC | PRN

## 2021-02-01 NOTE — Discharge Summary (Signed)
Physician Discharge Summary      Patient ID: Nicholas Horn MRN: [id number] DOB/AGE: 10-Aug-1960 60 y.o.  Admit date: 01/17/2021 Discharge date: 01/18/2021  Admission Diagnoses:  Active Problems:   S/P total knee arthroplasty, left   Arthritis of knee   Discharge Diagnoses:  Same  Surgeries: Procedure(s): LEFT TOTAL KNEE ARTHROPLASTY on 01/17/2021   Consultants:   Discharged Condition: Stable  Hospital Course: Nicholas Horn is an 60 y.o. male who was admitted 01/17/2021 with a chief complaint of left knee pain, and found to have a diagnosis of left knee osteoarthritis.  They were brought to the operating room on 01/17/2021 and underwent the above named procedures.  Pt awoke from anesthesia without complication and was transferred to the floor. On POD1, patient's pain was controlled overall and he was able to mobilize well with physical therapy.  He was discharged home on POD 1..  Pt will f/u with Dr. Marlou Sa in clinic in ~2 weeks.   Antibiotics given:  Anti-infectives (From admission, onward)    Start     Dose/Rate Route Frequency Ordered Stop   01/17/21 1600  ceFAZolin (ANCEF) IVPB 2g/100 mL premix        2 g 200 mL/hr over 30 Minutes Intravenous Every 6 hours 01/17/21 1301 01/17/21 2207   01/17/21 0915  vancomycin (VANCOCIN) powder  Status:  Discontinued          As needed 01/17/21 0915 01/17/21 1030   01/17/21 0600  ceFAZolin (ANCEF) IVPB 3g/100 mL premix        3 g 200 mL/hr over 30 Minutes Intravenous On call to O.R. 01/17/21 3382 01/17/21 0747     .  Recent vital signs:  Vitals:   01/18/21 0625 01/18/21 0811  BP: 124/75 125/75  Pulse: 64 74  Resp: 16 19  Temp: (!) 97.5 F (36.4 C) 98.6 F (37 C)  SpO2: 96% 96%    Recent laboratory studies:  Results for orders placed or performed during the hospital encounter of 01/13/21  Urine culture   Specimen: Urine, Clean Catch  Result Value Ref Range   Specimen Description URINE, CLEAN CATCH    Special Requests NONE     Culture (A)     <10,000 COLONIES/mL INSIGNIFICANT GROWTH Performed at [facility] [address] [place], [place] [address]    Report Status 01/14/2021 FINAL   Surgical pcr screen   Specimen: Nasal Mucosa; Nasal Swab  Result Value Ref Range   MRSA, PCR NEGATIVE NEGATIVE   Staphylococcus aureus NEGATIVE NEGATIVE  SARS CORONAVIRUS 2 (TAT 6-24 HRS) Nasopharyngeal Nasopharyngeal Swab   Specimen: Nasopharyngeal Swab  Result Value Ref Range   SARS Coronavirus 2 NEGATIVE NEGATIVE  CBC  Result Value Ref Range   WBC 5.7 4.0 - 10.5 K/uL   RBC 4.85 4.22 - 5.81 MIL/uL   Hemoglobin 15.0 13.0 - 17.0 g/dL   HCT 45.2 39.0 - 52.0 %   MCV 93.2 80.0 - 100.0 fL   MCH 30.9 26.0 - 34.0 pg   MCHC 33.2 30.0 - 36.0 g/dL   RDW 12.6 11.5 - 15.5 %   Platelets 212 150 - 400 K/uL   nRBC 0.0 0.0 - 0.2 %  Basic metabolic panel  Result Value Ref Range   Sodium 139 135 - 145 mmol/L   Potassium 3.8 3.5 - 5.1 mmol/L   Chloride 105 98 - 111 mmol/L   CO2 30 22 - 32 mmol/L   Glucose, Bld 77 70 - 99 mg/dL  BUN 12 6 - 20 mg/dL   Creatinine, Ser 1.03 0.61 - 1.24 mg/dL   Calcium 9.4 8.9 - 10.3 mg/dL   GFR, Estimated >60 >60 mL/min   Anion gap 4 (L) 5 - 15  Urinalysis, Routine w reflex microscopic  Result Value Ref Range   Color, Urine YELLOW YELLOW   APPearance HAZY (A) CLEAR   Specific Gravity, Urine 1.023 1.005 - 1.030   pH 5.0 5.0 - 8.0   Glucose, UA NEGATIVE NEGATIVE mg/dL   Hgb urine dipstick NEGATIVE NEGATIVE   Bilirubin Urine NEGATIVE NEGATIVE   Ketones, ur NEGATIVE NEGATIVE mg/dL   Protein, ur NEGATIVE NEGATIVE mg/dL   Nitrite NEGATIVE NEGATIVE   Leukocytes,Ua NEGATIVE NEGATIVE    Discharge Medications:   Allergies as of 01/18/2021       Reactions   Honey Nausea Only   GI upset        Medication List     STOP taking these medications    oxyCODONE-acetaminophen 10-325 MG tablet Commonly known as: PERCOCET       TAKE these medications    aspirin 81 MG chewable  tablet Chew 1 tablet (81 mg total) by mouth daily.   docusate sodium 100 MG capsule Commonly known as: COLACE Take 1 capsule (100 mg total) by mouth 2 (two) times daily.   doxycycline 100 MG capsule Commonly known as: VIBRAMYCIN Take 1 capsule (100 mg total) by mouth 2 (two) times daily. One po bid x 7 days   DULoxetine 20 MG capsule Commonly known as: CYMBALTA Take 20 mg by mouth 2 (two) times daily.   meloxicam 15 MG tablet Commonly known as: MOBIC Take 15 mg by mouth daily.   methocarbamol 750 MG tablet Commonly known as: ROBAXIN Take 750 mg by mouth daily.   mupirocin ointment 2 % Commonly known as: BACTROBAN Apply 1 application topically 2 (two) times daily.        Diagnostic Studies: No results found.  Disposition: Discharge disposition: 01-Home or Self Care       Discharge Instructions     Call MD / Call 911   Complete by: As directed    If you experience chest pain or shortness of breath, CALL 911 and be transported to the hospital emergency room.  If you develope a fever above 101 F, pus (white drainage) or increased drainage or redness at the wound, or calf pain, call your surgeon's office.   Constipation Prevention   Complete by: As directed    Drink plenty of fluids.  Prune juice may be helpful.  You may use a stool softener, such as Colace (over the counter) 100 mg twice a day.  Use MiraLax (over the counter) for constipation as needed.   Diet - low sodium heart healthy   Complete by: As directed    Discharge instructions   Complete by: As directed    You may shower, dressing is waterproof.  Do not remove the dressing, we will remove it at your first post-op appointment.  Do not take a bath or soak the knee in a tub or pool.  You may weightbear as you can tolerate on the operative leg with a walker.  Continue using the CPM machine 3 times per day for one hour each time, increasing the degrees of range of motion daily.  Use the blue cradle boot under  your heel to work on getting your leg straight.  Do NOT put a pillow under your knee.  You will follow-up with  Dr. Marlou Sa in the clinic in 2 weeks at your given appointment date.  Call the office with any questions or concerns.   INSTRUCTIONS AFTER JOINT REPLACEMENT   Remove items at home which could result in a fall. This includes throw rugs or furniture in walking pathways ICE to the affected joint every three hours while awake for 30 minutes at a time, for at least the first 3-5 days, and then as needed for pain and swelling.  Continue to use ice for pain and swelling. You may notice swelling that will progress down to the foot and ankle.  This is normal after surgery.  Elevate your leg when you are not up walking on it.   Continue to use the breathing machine you got in the hospital (incentive spirometer) which will help keep your temperature down.  It is common for your temperature to cycle up and down following surgery, especially at night when you are not up moving around and exerting yourself.  The breathing machine keeps your lungs expanded and your temperature down.   DIET:  As you were doing prior to hospitalization, we recommend a well-balanced diet.  DRESSING / WOUND CARE / SHOWERING  Keep the surgical dressing until follow up.  The dressing is water proof, so you can shower without any extra covering.  IF THE DRESSING FALLS OFF or the wound gets wet inside, change the dressing with sterile gauze.  Please use good hand washing techniques before changing the dressing.  Do not use any lotions or creams on the incision until instructed by your surgeon.    ACTIVITY  Increase activity slowly as tolerated, but follow the weight bearing instructions below.   No driving for 6 weeks or until further direction given by your physician.  You cannot drive while taking narcotics.  No lifting or carrying greater than 10 lbs. until further directed by your surgeon. Avoid periods of inactivity such as  sitting longer than an hour when not asleep. This helps prevent blood clots.  You may return to work once you are authorized by your doctor.     WEIGHT BEARING   Weight bearing as tolerated with assist device (walker, cane, etc) as directed, use it as long as suggested by your surgeon or therapist, typically at least 4-6 weeks.   EXERCISES  Results after joint replacement surgery are often greatly improved when you follow the exercise, range of motion and muscle strengthening exercises prescribed by your doctor. Safety measures are also important to protect the joint from further injury. Any time any of these exercises cause you to have increased pain or swelling, decrease what you are doing until you are comfortable again and then slowly increase them. If you have problems or questions, call your caregiver or physical therapist for advice.   Rehabilitation is important following a joint replacement. After just a few days of immobilization, the muscles of the leg can become weakened and shrink (atrophy).  These exercises are designed to build up the tone and strength of the thigh and leg muscles and to improve motion. Often times heat used for twenty to thirty minutes before working out will loosen up your tissues and help with improving the range of motion but do not use heat for the first two weeks following surgery (sometimes heat can increase post-operative swelling).   These exercises can be done on a training (exercise) mat, on the floor, on a table or on a bed. Use whatever works the best and is most  comfortable for you.    Use music or television while you are exercising so that the exercises are a pleasant break in your day. This will make your life better with the exercises acting as a break in your routine that you can look forward to.   Perform all exercises about fifteen times, three times per day or as directed.  You should exercise both the operative leg and the other leg as  well.  Exercises include:   Quad Sets - Tighten up the muscle on the front of the thigh (Quad) and hold for 5-10 seconds.   Straight Leg Raises - With your knee straight (if you were given a brace, keep it on), lift the leg to 60 degrees, hold for 3 seconds, and slowly lower the leg.  Perform this exercise against resistance later as your leg gets stronger.  Leg Slides: Lying on your back, slowly slide your foot toward your buttocks, bending your knee up off the floor (only go as far as is comfortable). Then slowly slide your foot back down until your leg is flat on the floor again.  Angel Wings: Lying on your back spread your legs to the side as far apart as you can without causing discomfort.  Hamstring Strength:  Lying on your back, push your heel against the floor with your leg straight by tightening up the muscles of your buttocks.  Repeat, but this time bend your knee to a comfortable angle, and push your heel against the floor.  You may put a pillow under the heel to make it more comfortable if necessary.   A rehabilitation program following joint replacement surgery can speed recovery and prevent re-injury in the future due to weakened muscles. Contact your doctor or a physical therapist for more information on knee rehabilitation.    CONSTIPATION  Constipation is defined medically as fewer than three stools per week and severe constipation as less than one stool per week.  Even if you have a regular bowel pattern at home, your normal regimen is likely to be disrupted due to multiple reasons following surgery.  Combination of anesthesia, postoperative narcotics, change in appetite and fluid intake all can affect your bowels.   YOU MUST use at least one of the following options; they are listed in order of increasing strength to get the job done.  They are all available over the counter, and you may need to use some, POSSIBLY even all of these options:    Drink plenty of fluids (prune juice  may be helpful) and high fiber foods Colace 100 mg by mouth twice a day  Senokot for constipation as directed and as needed Dulcolax (bisacodyl), take with full glass of water  Miralax (polyethylene glycol) once or twice a day as needed.  If you have tried all these things and are unable to have a bowel movement in the first 3-4 days after surgery call either your surgeon or your primary doctor.    If you experience loose stools or diarrhea, hold the medications until you stool forms back up.  If your symptoms do not get better within 1 week or if they get worse, check with your doctor.  If you experience "the worst abdominal pain ever" or develop nausea or vomiting, please contact the office immediately for further recommendations for treatment.   ITCHING:  If you experience itching with your medications, try taking only a single pain pill, or even half a pain pill at a time.  You can  also use Benadryl over the counter for itching or also to help with sleep.   TED HOSE STOCKINGS:  Use stockings on both legs until for at least 2 weeks or as directed by physician office. They may be removed at night for sleeping.  MEDICATIONS:  See your medication summary on the "After Visit Summary" that nursing will review with you.  You may have some home medications which will be placed on hold until you complete the course of blood thinner medication.  It is important for you to complete the blood thinner medication as prescribed.  PRECAUTIONS:  If you experience chest pain or shortness of breath - call 911 immediately for transfer to the hospital emergency department.   If you develop a fever greater that 101 F, purulent drainage from wound, increased redness or drainage from wound, foul odor from the wound/dressing, or calf pain - CONTACT YOUR SURGEON.                                                   FOLLOW-UP APPOINTMENTS:  If you do not already have a post-op appointment, please call the office for an  appointment to be seen by your surgeon.  Guidelines for how soon to be seen are listed in your "After Visit Summary", but are typically between 1-4 weeks after surgery.  OTHER INSTRUCTIONS:   Knee Replacement:  Do not place pillow under knee, focus on keeping the knee straight while resting. CPM instructions: 0-90 degrees, 2 hours in the morning, 2 hours in the afternoon, and 2 hours in the evening. Place foam block, curve side up under heel at all times except when in CPM or when walking.  DO NOT modify, tear, cut, or change the foam block in any way.  POST-OPERATIVE OPIOID TAPER INSTRUCTIONS: It is important to wean off of your opioid medication as soon as possible. If you do not need pain medication after your surgery it is ok to stop day one. Opioids include: Codeine, Hydrocodone(Norco, Vicodin), Oxycodone(Percocet, oxycontin) and hydromorphone amongst others.  Long term and even short term use of opiods can cause: Increased pain response Dependence Constipation Depression Respiratory depression And more.  Withdrawal symptoms can include Flu like symptoms Nausea, vomiting And more Techniques to manage these symptoms Hydrate well Eat regular healthy meals Horn active Use relaxation techniques(deep breathing, meditating, yoga) Do Not substitute Alcohol to help with tapering If you have been on opioids for less than two weeks and do not have pain than it is ok to stop all together.  Plan to wean off of opioids This plan should start within one week post op of your joint replacement. Maintain the same interval or time between taking each dose and first decrease the dose.  Cut the total daily intake of opioids by one tablet each day Next start to increase the time between doses. The last dose that should be eliminated is the evening dose.   MAKE SURE YOU:  Understand these instructions.  Get help right away if you are not doing well or get worse.    Thank you for letting us be  a part of your medical care team.  It is a privilege we respect greatly.  We hope these instructions will help you Horn on track for a fast and full recovery!    Dental Antibiotics:  In most  cases prophylactic antibiotics for Dental procdeures after total joint surgery are not necessary.  Exceptions are as follows:  1. History of prior total joint infection  2. Severely immunocompromised (Organ Transplant, cancer chemotherapy, Rheumatoid biologic meds such as [place])  3. Poorly controlled diabetes (A1C &gt; 8.0, blood glucose over 200)  If you have one of these conditions, contact your surgeon for an antibiotic prescription, prior to your dental procedure.   Increase activity slowly as tolerated   Complete by: As directed    Post-operative opioid taper instructions:   Complete by: As directed    POST-OPERATIVE OPIOID TAPER INSTRUCTIONS: It is important to wean off of your opioid medication as soon as possible. If you do not need pain medication after your surgery it is ok to stop day one. Opioids include: Codeine, Hydrocodone(Norco, Vicodin), Oxycodone(Percocet, oxycontin) and hydromorphone amongst others.  Long term and even short term use of opiods can cause: Increased pain response Dependence Constipation Depression Respiratory depression And more.  Withdrawal symptoms can include Flu like symptoms Nausea, vomiting And more Techniques to manage these symptoms Hydrate well Eat regular healthy meals Horn active Use relaxation techniques(deep breathing, meditating, yoga) Do Not substitute Alcohol to help with tapering If you have been on opioids for less than two weeks and do not have pain than it is ok to stop all together.  Plan to wean off of opioids This plan should start within one week post op of your joint replacement. Maintain the same interval or time between taking each dose and first decrease the dose.  Cut the total daily intake of opioids by one tablet each  day Next start to increase the time between doses. The last dose that should be eliminated is the evening dose.             SignedDonella Stade 02/01/2021, 9:14 PM

## 2021-02-01 NOTE — Telephone Encounter (Signed)
Pt called and states pharmacy said pt needs pre approval from insurance company for [place] amount of pain pills.   CB [phone]

## 2021-02-02 NOTE — Telephone Encounter (Signed)
PA submitted on [email or web address]

## 2021-02-03 ENCOUNTER — Ambulatory Visit: Payer: Medicaid Other | Attending: Orthopedic Surgery | Admitting: Physical Therapy

## 2021-02-06 ENCOUNTER — Encounter: Payer: Self-pay | Admitting: Orthopedic Surgery

## 2021-02-06 NOTE — Progress Notes (Signed)
Post-Op Visit Note   Patient: Nicholas Horn           Date of Birth: 1961/07/11           MRN: [id number] Visit Date: 02/01/2021 PCP: [place]:  Chief Complaint:  Chief Complaint  Patient presents with   Other    01/17/21 Left TKA   Visit Diagnoses:  1. Status post total left knee replacement     Plan: Alias is a 60 year old patient underwent left total knee replacement 01/17/2021.  Having home health therapy 2-3 times a week.  CPM machine is at 118.  On exam the incision looks good.  No calf tenderness negative Hom[place].  Refill Percocet today.  Outpatient therapy to start 1-2 times a week for 6 weeks for range of motion and strengthening.  He is on aspirin for DVT prophylaxis.  Continue that until next postop visit in 4 weeks.  Follow-Up Instructions: Return in about 4 weeks (around 03/01/2021).   Orders:  Orders Placed This Encounter  Procedures   XR Knee 1-2 Views Left   Ambulatory referral to Physical Therapy   Meds ordered this encounter  Medications   oxyCODONE-acetaminophen (PERCOCET) 10-325 MG tablet    Sig: Take 1 tablet by mouth every 6 (six) hours as needed for pain.    Dispense:  45 tablet    Refill:  0    Imaging: No results found.  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   Primary osteoarthritis of left knee 10/08/2016   Pain in right hand 09/21/2016   Closed nondisplaced fracture of proximal phalanx of right middle finger 08/24/2016   Right hip pain 08/24/2016   S/P laparoscopic appendectomy 10/02/2014   Left knee pain 07/01/2013   Morbid obesity ([place]) 01/23/2013   Left lumbar radiculopathy 01/23/2013   Heel pain 01/23/2013   HYPERLIPIDEMIA 09/29/2008   SLEEP APNEA, OBSTRUCTIVE 09/29/2008   HEADACHE, SEVERE 09/29/2008   Past Medical History:  Diagnosis Date   Arthritis    spine   Back pain, chronic    EKG  abnormality 2007   stress test and cardiac evaluation - [facility]   History of kidney stones    Hyperlipidemia    no meds, diet controlled   Kidney stones 2017   passed stones   Shortness of breath    with exertion   Sleep apnea 2007   Does not use CPAP   Vision impairment    wears reading glasses    Family History  Problem Relation Age of Onset   Heart disease Mother 73       died of heart disease, age 35yo   Obesity Mother    Hypertension Mother    Hyperlipidemia Mother    Cancer Father        died of stomach and intestinal cancer   Heart disease Brother        CAD, stent   Other Brother        substance abuse   Diabetes Neg Hx    Stroke Neg Hx    Colon cancer Neg Hx     Past Surgical History:  Procedure Laterality Date   APPENDECTOMY     BACK SURGERY  2014   Micro disectomy L3-4   CARDIAC CATHETERIZATION  2007   due to equivocal stress test.  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 07/15/2013   Procedure: Resection right Haglund Deformity;  Surgeon: Newt Minion, MD;  Location: [facility];  Service: Orthopedics;  Laterality: Right;  Resection right Haglund Deformity   KNEE ARTHROSCOPY Left 2014   KNEE SURGERY     LAPAROSCOPIC APPENDECTOMY N/A 10/01/2014   Procedure: APPENDECTOMY LAPAROSCOPIC;  Surgeon: Georganna Skeans, MD;  Location: [facility];  Service: General;  Laterality: N/A;   LITHOTRIPSY     kidney stone    TOTAL KNEE ARTHROPLASTY Left 01/17/2021   Procedure: LEFT TOTAL KNEE ARTHROPLASTY;  Surgeon: Meredith Pel, MD;  Location: [facility];  Service: Orthopedics;  Laterality: Left;   WISDOM TOOTH EXTRACTION     age 47yo   Social History   Occupational History   Occupation: Doctor, hospital: [employer]  Tobacco Use   Smoking status: Former    Years: 7.00    Pack years: 0.00    Types: Cigarettes    Start date: 09/06/1985   Smokeless tobacco: Never   Tobacco  comments:    quit in 1987  Vaping Use   Vaping Use: Never used  Substance and Sexual Activity   Alcohol use: No   Drug use: No   Sexual activity: Not Currently

## 2021-02-09 ENCOUNTER — Other Ambulatory Visit: Payer: Self-pay | Admitting: Surgical

## 2021-03-04 ENCOUNTER — Other Ambulatory Visit: Payer: Self-pay | Admitting: Surgical

## 2021-04-05 ENCOUNTER — Other Ambulatory Visit: Payer: Self-pay

## 2021-04-05 ENCOUNTER — Ambulatory Visit (INDEPENDENT_AMBULATORY_CARE_PROVIDER_SITE_OTHER): Payer: Medicaid Other | Admitting: Orthopedic Surgery

## 2021-04-05 ENCOUNTER — Encounter: Payer: Self-pay | Admitting: Orthopedic Surgery

## 2021-04-05 DIAGNOSIS — Z96652 Presence of left artificial knee joint: Secondary | ICD-10-CM

## 2021-04-05 NOTE — Progress Notes (Signed)
Post-Op Visit Note   Patient: Nicholas Horn           Date of Birth: 08-20-1960           MRN: [id number] Visit Date: 04/05/2021 PCP: [facility]:  Chief Complaint:  Chief Complaint  Patient presents with   Left Knee - Routine Post Op    01/17/21 Left TKA   Visit Diagnoses: No diagnosis found.  Plan: Patient is a 60 year old male who presents s/p left total knee arthroplasty on 01/17/2021.  He reports that he is doing well.  He never went to outpatient physical therapy as he had to go to a trip for work to [place] for about 3 to 4 weeks.  He only had [facility] physical therapy following his surgery.  He reports that he is walking well without any cane or walker.  He has occasional neck pain but this is pretty rare.  Pain is worse in his knee after long activity greater than 3 hours.  He notes some stiffness that comes on when he is immobile but overall he is trending better.  Denies any fevers, chills, night sweats, change in the appearance of the incision, chest pain, shortness of breath, calf pain.  On exam he has 0 to 120 degrees.  Incision is healing well with no evidence of infection or dehiscence.  No calf tenderness.  Negative Homans' sign.  Excellent quadricep strength and able to do several straight leg raises without any extensor lag.  No effusion noted.  He ambulates without difficulty.  Plan is for patient to follow-up with the office as needed as he is doing very well.  Discussed dental prophylaxis with him and he understands the concept.  Follow-up as needed.  Follow-Up Instructions: No follow-ups on file.   Orders:  No orders of the defined types were placed in this encounter.  No orders of the defined types were placed in this encounter.   Imaging: No results found.  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   Primary osteoarthritis of left knee  10/08/2016   Pain in right hand 09/21/2016   Closed nondisplaced fracture of proximal phalanx of right middle finger 08/24/2016   Right hip pain 08/24/2016   S/P laparoscopic appendectomy 10/02/2014   Left knee pain 07/01/2013   Morbid obesity ([place]) 01/23/2013   Left lumbar radiculopathy 01/23/2013   Heel pain 01/23/2013   HYPERLIPIDEMIA 09/29/2008   SLEEP APNEA, OBSTRUCTIVE 09/29/2008   HEADACHE, SEVERE 09/29/2008   Past Medical History:  Diagnosis Date   Arthritis    spine   Back pain, chronic    EKG abnormality 2007   stress test and cardiac evaluation - [facility]   History of kidney stones    Hyperlipidemia    no meds, diet controlled   Kidney stones 2017   passed stones   Shortness of breath    with exertion   Sleep apnea 2007   Does not use CPAP   Vision impairment    wears reading glasses    Family History  Problem Relation Age of Onset   Heart disease Mother 44       died of heart disease, age 30yo   Obesity Mother    Hypertension Mother    Hyperlipidemia Mother    Cancer Father        died of stomach and  intestinal cancer   Heart disease Brother        CAD, stent   Other Brother        substance abuse   Diabetes Neg Hx    Stroke Neg Hx    Colon cancer Neg Hx     Past Surgical History:  Procedure Laterality Date   APPENDECTOMY     BACK SURGERY  2014   Micro disectomy L3-4   CARDIAC CATHETERIZATION  2007   due to equivocal stress test.  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 Right 07/15/2013   Procedure: Resection right Haglund Deformity;  Surgeon: Newt Minion, MD;  Location: [facility];  Service: Orthopedics;  Laterality: Right;  Resection right Haglund Deformity   KNEE ARTHROSCOPY Left 2014   KNEE SURGERY     LAPAROSCOPIC APPENDECTOMY N/A 10/01/2014   Procedure: APPENDECTOMY LAPAROSCOPIC;  Surgeon: Georganna Skeans, MD;  Location: [facility];  Service: General;  Laterality: N/A;    LITHOTRIPSY     kidney stone    TOTAL KNEE ARTHROPLASTY Left 01/17/2021   Procedure: LEFT TOTAL KNEE ARTHROPLASTY;  Surgeon: Meredith Pel, MD;  Location: [facility];  Service: Orthopedics;  Laterality: Left;   WISDOM TOOTH EXTRACTION     age 52yo   Social History   Occupational History   Occupation: Doctor, hospital: [employer]  Tobacco Use   Smoking status: Former    Years: 7.00    Types: Cigarettes    Start date: 09/06/1985   Smokeless tobacco: Never   Tobacco comments:    quit in 1987  Vaping Use   Vaping Use: Never used  Substance and Sexual Activity   Alcohol use: No   Drug use: No   Sexual activity: Not Currently

## 2021-04-24 ENCOUNTER — Telehealth: Payer: Self-pay | Admitting: Orthopedic Surgery

## 2021-04-24 NOTE — Telephone Encounter (Signed)
Patient called. He is joining the gym. Would like to know if there are any exercises he should be avoiding. His call back number is [phone]

## 2021-04-25 NOTE — Telephone Encounter (Signed)
Avoid running and squats and lunges

## 2021-04-25 NOTE — Telephone Encounter (Signed)
IC advised patient. He verbalized understanding.

## 2021-06-02 ENCOUNTER — Ambulatory Visit (INDEPENDENT_AMBULATORY_CARE_PROVIDER_SITE_OTHER): Payer: Medicaid Other | Admitting: Surgical

## 2021-06-02 ENCOUNTER — Encounter: Payer: Self-pay | Admitting: Surgical

## 2021-06-02 DIAGNOSIS — Z96652 Presence of left artificial knee joint: Secondary | ICD-10-CM | POA: Diagnosis not present

## 2021-06-04 ENCOUNTER — Encounter: Payer: Self-pay | Admitting: Surgical

## 2021-06-04 NOTE — Progress Notes (Signed)
Office Visit Note   Patient: Nicholas Horn           Date of Birth: 1961/08/02           MRN: [id number] Visit Date: 06/02/2021 Requested by: Center, [facility] [address] [place],  [place] [address] PCP: [facility], [facility]  Subjective: Chief Complaint  Patient presents with   Left Knee - Follow-up    HPI: Nicholas Horn is a 60 y.o. male who presents to the office s/p left knee total knee arthroplasty on 01/17/2021.  He is doing well overall and reports 0/10 pain.  He has started being more physically active at the gym and he wanted to make sure that his left knee is still okay.  He reports he is not taking any medications for pain.  He has no recurrent pain though he does occasionally have sharp pain sensation when doing exercises but this is fleeting and never lasts.  He has no instability to the knee.  No change in the appearance of the incision.  No recent injuries or falls.  At the gym he is not doing any running and not doing any plyometrics such as jumping.  Mostly focusing on leg press machine, quad machine such as leg extension and leg flexion machine for hamstring.  He also rides a stationary bike and has significantly improved his cardiovascular fitness as it takes him about 10 times as long to reach his higher heart rate than it used to when he for started out..                ROS: All systems reviewed are negative as they relate to the chief complaint within the history of present illness.  Patient denies fevers or chills.  Assessment & Plan: Visit Diagnoses:  1. Status post total left knee replacement     Plan: Patient is a 60 year old male who presents for evaluation of exercise program following total knee arthroplasty in June 2022.  He has had excellent pain relief from his knee replacement and he has great range of motion on exam today.  Discussed the exercises that he is doing for his left knee at the gym and all of them seem appropriate.  Mainly  want to avoid running and jumping as well as anything that causes increased and prolonged pain in the knee.  He has not really had any of these experiences and plan to continue with his current exercise program.  Follow-up with the office as needed.  Follow-Up Instructions: No follow-ups on file.   Orders:  No orders of the defined types were placed in this encounter.  No orders of the defined types were placed in this encounter.     Procedures: No procedures performed   Clinical Data: No additional findings.  Objective: Vital Signs: There were no vitals taken for this visit.  Physical Exam:  Constitutional: Patient appears well-developed HEENT:  Head: Normocephalic Eyes:EOM are normal Neck: Normal range of motion Cardiovascular: Normal rate Pulmonary/chest: Effort normal Neurologic: Patient is alert Skin: Skin is warm Psychiatric: Patient has normal mood and affect  Ortho Exam: Ortho exam demonstrates left knee with well-healed incision from prior total knee replacement.  No effusion present.  0 degrees extension and 120 degrees of knee flexion.  No calf tenderness.  Negative Homans' sign.  Able to perform straight leg raise with excellent quadricep strength rated 5/5.  5/5 dorsiflexion strength of the left ankle.  No significant laxity to varus stress.  Very minimal  laxity to valgus stress.  No mid flexion instability.  Specialty Comments:  No specialty comments available.  Imaging: No results found.   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   Primary osteoarthritis of left knee 10/08/2016   Pain in right hand 09/21/2016   Closed nondisplaced fracture of proximal phalanx of right middle finger 08/24/2016   Right hip pain 08/24/2016   S/P laparoscopic appendectomy 10/02/2014   Left knee pain 07/01/2013   Morbid obesity ([place]) 01/23/2013   Left lumbar radiculopathy  01/23/2013   Heel pain 01/23/2013   HYPERLIPIDEMIA 09/29/2008   SLEEP APNEA, OBSTRUCTIVE 09/29/2008   HEADACHE, SEVERE 09/29/2008   Past Medical History:  Diagnosis Date   Arthritis    spine   Back pain, chronic    EKG abnormality 2007   stress test and cardiac evaluation - [facility]   History of kidney stones    Hyperlipidemia    no meds, diet controlled   Kidney stones 2017   passed stones   Shortness of breath    with exertion   Sleep apnea 2007   Does not use CPAP   Vision impairment    wears reading glasses    Family History  Problem Relation Age of Onset   Heart disease Mother 45       died of heart disease, age 70yo   Obesity Mother    Hypertension Mother    Hyperlipidemia Mother    Cancer Father        died of stomach and intestinal cancer   Heart disease Brother        CAD, stent   Other Brother        substance abuse   Diabetes Neg Hx    Stroke Neg Hx    Colon cancer Neg Hx     Past Surgical History:  Procedure Laterality Date   APPENDECTOMY     BACK SURGERY  2014   Micro disectomy L3-4   CARDIAC CATHETERIZATION  2007   due to equivocal stress test.  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 Right 07/15/2013   Procedure: Resection right Haglund Deformity;  Surgeon: Newt Minion, MD;  Location: [facility];  Service: Orthopedics;  Laterality: Right;  Resection right Haglund Deformity   KNEE ARTHROSCOPY Left 2014   KNEE SURGERY     LAPAROSCOPIC APPENDECTOMY N/A 10/01/2014   Procedure: APPENDECTOMY LAPAROSCOPIC;  Surgeon: Georganna Skeans, MD;  Location: [facility];  Service: General;  Laterality: N/A;   LITHOTRIPSY     kidney stone    TOTAL KNEE ARTHROPLASTY Left 01/17/2021   Procedure: LEFT TOTAL KNEE ARTHROPLASTY;  Surgeon: Meredith Pel, MD;  Location: [facility];  Service: Orthopedics;  Laterality: Left;   WISDOM TOOTH EXTRACTION     age 38yo   Social History   Occupational  History   Occupation: Doctor, 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

## 2021-06-06 ENCOUNTER — Telehealth: Payer: Self-pay

## 2021-06-06 NOTE — Telephone Encounter (Signed)
Patient called wanting to know if he could be worked into Dr. Randel Pigg schedule?  Stated that he had a fall on his left knee last night and his knee is swollen and in pain.  Okay to work him in tomorrow, 06/07/2021?  CB# [phone].  Please advise.  Thank you.

## 2021-06-06 NOTE — Telephone Encounter (Signed)
IC patient fell and all of his weight landed on knee just had replaced. I have worked him in to see Dr Marlou Sa tomorrow

## 2021-06-06 NOTE — Telephone Encounter (Signed)
Pt called again stating he would like a CB ASAP; he's worried he may have reinjured his knee.  [phone]

## 2021-06-07 ENCOUNTER — Ambulatory Visit: Payer: Medicaid Other | Admitting: Orthopedic Surgery

## 2021-06-07 ENCOUNTER — Ambulatory Visit (INDEPENDENT_AMBULATORY_CARE_PROVIDER_SITE_OTHER): Payer: Medicaid Other

## 2021-06-07 ENCOUNTER — Encounter: Payer: Self-pay | Admitting: Orthopedic Surgery

## 2021-06-07 ENCOUNTER — Other Ambulatory Visit: Payer: Self-pay

## 2021-06-07 DIAGNOSIS — Z96652 Presence of left artificial knee joint: Secondary | ICD-10-CM

## 2021-06-07 NOTE — Progress Notes (Signed)
Office Visit Note   Patient: Nicholas Horn           Date of Birth: [birth year]           MRN: [id number] Visit Date: 06/07/2021 Requested by: [facility], [facility] [address] [place],  [place] [address] PCP: [facility], [facility]  Subjective: Chief Complaint  Patient presents with   Left Knee - Pain    01/17/21 (53m 19d) Left Total Knee Arthroplasty - Left      HPI: Nicholas Horn is a 60 year old patient underwent left total knee replacement 622.  Fell directly on the surgical knee 3 or 4 days ago.  Has a fall from standing on the left.  Now describes tibial pain.  He joined the gym.  Is not doing squats or treadmill.  He rides a bike for about 40 minutes a day.  The fall did give him some pain but he has recovered from that and is able to do stairs.              ROS: All systems reviewed are negative as they relate to the chief complaint within the history of present illness.  Patient denies  fevers or chills.   Assessment & Plan: Visit Diagnoses:  1. Status post total left knee replacement     Plan: Impression is left knee contusion with intact extensor mechanism and no fracture or radiographic abnormality of the knee replacement.  I would favor return office visit in 4 weeks only if he is not 50% better than he is right now from this bone contusion.  Follow-up at that time if symptoms have not resolved.  Follow-Up Instructions: Return if symptoms worsen or fail to improve.   Orders:  Orders Placed This Encounter  Procedures   XR Knee 1-2 Views Left   No orders of the defined types were placed in this encounter.     Procedures: No procedures performed   Clinical Data: No additional findings.  Objective: Vital Signs: There were no vitals taken for this visit.  Physical Exam:   Constitutional: Patient appears well-developed HEENT:  Head: Normocephalic Eyes:EOM are normal Neck: Normal range of motion Cardiovascular: Normal rate Pulmonary/chest:  Effort normal Neurologic: Patient is alert Skin: Skin is warm Psychiatric: Patient has normal mood and affect   Ortho Exam: Ortho exam demonstrates normal gait and alignment.  Excellent knee range of motion on that left side from 0-1 15.  There is no knee effusion.  Extensor mechanism is intact.  No patellar tenderness.  No discrete tibial plateau tenderness.  Specialty Comments:  No specialty comments available.  Imaging: XR Knee 1-2 Views Left  Result Date: 06/07/2021 AP lateral radiographs left knee reviewed.  Press-fit prosthesis in good position alignment with no complicating features.  No acute fracture.  No evidence of loosening.    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   Primary osteoarthritis of left knee 10/08/2016   Pain in right hand 09/21/2016   Closed nondisplaced fracture of proximal phalanx of right middle finger 08/24/2016   Right hip pain 08/24/2016   S/P laparoscopic appendectomy 10/02/2014   Left knee pain 07/01/2013   Morbid obesity ([place]) 01/23/2013   Left lumbar radiculopathy 01/23/2013   Heel pain 01/23/2013   HYPERLIPIDEMIA 09/29/2008   SLEEP APNEA, OBSTRUCTIVE 09/29/2008   HEADACHE, SEVERE 09/29/2008   Past Medical History:  Diagnosis Date   Arthritis  spine   Back pain, chronic    EKG abnormality 2007   stress test and cardiac evaluation - [facility]   History of kidney stones    Hyperlipidemia    no meds, diet controlled   Kidney stones 2017   passed stones   Shortness of breath    with exertion   Sleep apnea 2007   Does not use CPAP   Vision impairment    wears reading glasses    Family History  Problem Relation Age of Onset   Heart disease Mother 46       died of heart disease, age 56yo   Obesity Mother    Hypertension Mother    Hyperlipidemia Mother    Cancer Father        died of stomach and intestinal cancer   Heart  disease Brother        CAD, stent   Other Brother        substance abuse   Diabetes Neg Hx    Stroke Neg Hx    Colon cancer Neg Hx     Past Surgical History:  Procedure Laterality Date   APPENDECTOMY     BACK SURGERY  2014   Micro disectomy L3-4   CARDIAC CATHETERIZATION  2007   due to equivocal stress test.  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 Right 07/15/2013   Procedure: Resection right Haglund Deformity;  Surgeon: Newt Minion, MD;  Location: [facility];  Service: Orthopedics;  Laterality: Right;  Resection right Haglund Deformity   KNEE ARTHROSCOPY Left 2014   KNEE SURGERY     LAPAROSCOPIC APPENDECTOMY N/A 10/01/2014   Procedure: APPENDECTOMY LAPAROSCOPIC;  Surgeon: Georganna Skeans, MD;  Location: [facility];  Service: General;  Laterality: N/A;   LITHOTRIPSY     kidney stone    TOTAL KNEE ARTHROPLASTY Left 01/17/2021   Procedure: LEFT TOTAL KNEE ARTHROPLASTY;  Surgeon: Meredith Pel, MD;  Location: [facility];  Service: Orthopedics;  Laterality: Left;   WISDOM TOOTH EXTRACTION     age 61yo   Social History   Occupational History   Occupation: Doctor, 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

## 2021-06-28 ENCOUNTER — Other Ambulatory Visit: Payer: Self-pay | Admitting: Surgical

## 2021-10-16 ENCOUNTER — Emergency Department (HOSPITAL_BASED_OUTPATIENT_CLINIC_OR_DEPARTMENT_OTHER)
Admission: EM | Admit: 2021-10-16 | Discharge: 2021-10-16 | Disposition: A | Discharge status: Home / Self Care | Payer: Medicaid Other | Attending: Emergency Medicine | Admitting: Emergency Medicine

## 2021-10-16 ENCOUNTER — Emergency Department (HOSPITAL_BASED_OUTPATIENT_CLINIC_OR_DEPARTMENT_OTHER): Payer: Medicaid Other

## 2021-10-16 ENCOUNTER — Encounter (HOSPITAL_BASED_OUTPATIENT_CLINIC_OR_DEPARTMENT_OTHER): Payer: Self-pay | Admitting: Radiology

## 2021-10-16 ENCOUNTER — Other Ambulatory Visit: Payer: Self-pay

## 2021-10-16 DIAGNOSIS — Z7982 Long term (current) use of aspirin: Secondary | ICD-10-CM | POA: Diagnosis not present

## 2021-10-16 DIAGNOSIS — R319 Hematuria, unspecified: Secondary | ICD-10-CM | POA: Diagnosis present

## 2021-10-16 DIAGNOSIS — R310 Gross hematuria: Secondary | ICD-10-CM | POA: Insufficient documentation

## 2021-10-16 LAB — URINALYSIS, ROUTINE W REFLEX MICROSCOPIC
Bilirubin Urine: NEGATIVE
Glucose, UA: NEGATIVE mg/dL
Ketones, ur: NEGATIVE mg/dL
Nitrite: NEGATIVE
Protein, ur: 100 mg/dL — AB
RBC / HPF: >50 RBC/hpf — ABNORMAL HIGH (ref 0–5)
Specific Gravity, Urine: 1.029 (ref 1.005–1.030)
pH: 6 (ref 5.0–8.0)

## 2021-10-16 LAB — COMPREHENSIVE METABOLIC PANEL
ALT: 15 U/L (ref 0–44)
AST: 16 U/L (ref 15–41)
Albumin: 4.4 g/dL (ref 3.5–5.0)
Alkaline Phosphatase: 69 U/L (ref 38–126)
Anion gap: 8 (ref 5–15)
BUN: 15 mg/dL (ref 6–20)
CO2: 28 mmol/L (ref 22–32)
Calcium: 9.8 mg/dL (ref 8.9–10.3)
Chloride: 104 mmol/L (ref 98–111)
Creatinine, Ser: 1.02 mg/dL (ref 0.61–1.24)
GFR, Estimated: >60 mL/min (ref >60)
Glucose, Bld: 145 mg/dL — ABNORMAL HIGH (ref 70–99)
Potassium: 3.2 mmol/L — ABNORMAL LOW (ref 3.5–5.1)
Sodium: 140 mmol/L (ref 135–145)
Total Bilirubin: 0.7 mg/dL (ref 0.3–1.2)
Total Protein: 8 g/dL (ref 6.5–8.1)

## 2021-10-16 LAB — CBC
HCT: 45.4 % (ref 39.0–52.0)
Hemoglobin: 15.6 g/dL (ref 13.0–17.0)
MCH: 31.1 pg (ref 26.0–34.0)
MCHC: 34.4 g/dL (ref 30.0–36.0)
MCV: 90.6 fL (ref 80.0–100.0)
Platelets: 216 10*3/uL (ref 150–400)
RBC: 5.01 MIL/uL (ref 4.22–5.81)
RDW: 12.5 % (ref 11.5–15.5)
WBC: 7.5 10*3/uL (ref 4.0–10.5)
nRBC: 0 % (ref 0.0–0.2)

## 2021-10-16 LAB — LIPASE, BLOOD: Lipase: 34 U/L (ref 11–51)

## 2021-10-16 MED ORDER — IOHEXOL 300 MG/ML  SOLN
100.0000 mL | Freq: Once | INTRAMUSCULAR | Status: AC | PRN
  Administered 2021-10-16: 100 mL via INTRAVENOUS

## 2021-10-16 NOTE — ED Provider Notes (Cosign Needed)
[facility] EMERGENCY DEPT Provider Note   CSN: [id number] Arrival date & time: 10/16/21  1758     History  Chief Complaint  Patient presents with   Abdominal Pain   Hematuria    Nicholas Horn is a 61 y.o. male who states that he woke up this morning and had gross bloody output in his urine first thing to start the day.  He denies any abdominal pain to this provider.  He does endorse history of kidney stones.  Denies any pain in his penis or his scrotum.  Denies any nausea, vomiting, or diarrhea.  Denies any fevers or chills.  I personally reviewed this patient's medical records.  He has history of hyperlipidemia, back pain, lithotripsy.  He is not currently anticoagulated.  He is never seen a urologist.  HPI     Home Medications Prior to Admission medications   Medication Sig Start Date End Date Taking? Authorizing Provider  [facility] ASPIRIN ADULT LOW DOSE 81 MG chewable tablet CHEW 1 TABLET BY MOUTH EVERY DAY 06/28/21   Magnant, Charles L, PA-C  docusate sodium (COLACE) 100 MG capsule Take 1 capsule (100 mg total) by mouth 2 (two) times daily. 01/18/21   Magnant, Gerrianne Scale, PA-C  doxycycline (VIBRAMYCIN) 100 MG capsule Take 1 capsule (100 mg total) by mouth 2 (two) times daily. One po bid x 7 days 11/28/20   Margarita Mail, PA-C  DULoxetine (CYMBALTA) 20 MG capsule Take 20 mg by mouth 2 (two) times daily.    [provider]  meloxicam (MOBIC) 15 MG tablet Take 15 mg by mouth daily.    [provider]  methocarbamol (ROBAXIN) 750 MG tablet Take 750 mg by mouth daily.    [provider]  mupirocin ointment (BACTROBAN) 2 % Apply 1 application topically 2 (two) times daily. 11/28/20   Margarita Mail, PA-C  oxyCODONE-acetaminophen (PERCOCET) 10-325 MG tablet Take 1 tablet by mouth every 6 (six) hours as needed for pain. 02/01/21   Meredith Pel, MD      Allergies    Honey    Review of Systems   Review of Systems  Constitutional:  Negative.   HENT: Negative.    Respiratory: Negative.    Cardiovascular: Negative.   Gastrointestinal: Negative.   Genitourinary:  Positive for hematuria. Negative for decreased urine volume, dysuria, enuresis, flank pain, frequency, genital sores, penile discharge, penile pain, penile swelling, scrotal swelling, testicular pain and urgency.  Musculoskeletal: Negative.   Skin: Negative.   Neurological: Negative.   Hematological: Negative.   Psychiatric/Behavioral: Negative.     Physical Exam Updated Vital Signs BP (!) 170/95    Pulse 64    Temp 98.1 F (36.7 C)    Resp 16    Ht '6\' 4"'$  (1.93 m)    Wt (!) 140.6 kg    SpO2 97%    BMI 37.73 kg/m  Physical Exam Vitals and nursing note reviewed.  Constitutional:      Appearance: He is obese. He is not ill-appearing or toxic-appearing.  HENT:     Head: Normocephalic and atraumatic.     Mouth/Throat:     Mouth: Mucous membranes are moist.     Pharynx: No oropharyngeal exudate or posterior oropharyngeal erythema.  Eyes:     General:        Right eye: No discharge.        Left eye: No discharge.     Extraocular Movements: Extraocular movements intact.     Conjunctiva/sclera: Conjunctivae normal.  Pupils: Pupils are equal, round, and reactive to light.  Cardiovascular:     Rate and Rhythm: Normal rate and regular rhythm.     Pulses: Normal pulses.     Heart sounds: Normal heart sounds. No murmur heard. Pulmonary:     Effort: Pulmonary effort is normal. No respiratory distress.     Breath sounds: Normal breath sounds. No wheezing or rales.  Abdominal:     General: Bowel sounds are normal. There is no distension.     Tenderness: There is no abdominal tenderness. There is no right CVA tenderness, left CVA tenderness, guarding or rebound. Negative signs include Murphy's sign and Rovsing's sign.     Hernia: No hernia is present.  Musculoskeletal:        General: No deformity.     Cervical back: Neck supple.  Skin:    General: Skin  is warm and dry.     Capillary Refill: Capillary refill takes less than 2 seconds.  Neurological:     General: No focal deficit present.     Mental Status: He is alert and oriented to person, place, and time. Mental status is at baseline.  Psychiatric:        Mood and Affect: Mood normal.    ED Results / Procedures / Treatments   Labs (all labs ordered are listed, but only abnormal results are displayed) Labs Reviewed  COMPREHENSIVE METABOLIC PANEL - Abnormal; Notable for the following components:      Result Value   Potassium 3.2 (*)    Glucose, Bld 145 (*)    All other components within normal limits  URINALYSIS, ROUTINE W REFLEX MICROSCOPIC - Abnormal; Notable for the following components:   APPearance HAZY (*)    Hgb urine dipstick LARGE (*)    Protein, ur 100 (*)    Leukocytes,Ua TRACE (*)    RBC / HPF >50 (*)    Bacteria, UA RARE (*)    All other components within normal limits  URINE CULTURE  LIPASE, BLOOD  CBC    EKG None  Radiology CT Abdomen Pelvis W Contrast  Result Date: 10/16/2021 CLINICAL DATA:  Hematuria, gross/macroscopic.Bloody urine started this morning. Says his abdomen feels bloated. Hx of kidney stones. The stream of his first urination was just blood this morning. EXAM: CT ABDOMEN AND PELVIS WITH CONTRAST TECHNIQUE: Multidetector CT imaging of the abdomen and pelvis was performed using the standard protocol following bolus administration of intravenous contrast. RADIATION DOSE REDUCTION: This exam was performed according to the departmental dose-optimization program which includes automated exposure control, adjustment of the mA and/or kV according to patient size and/or use of iterative reconstruction technique. CONTRAST:  160m OMNIPAQUE IOHEXOL 300 MG/ML  SOLN COMPARISON:  CT abdomen pelvis 10/02/14 FINDINGS: Lower chest: Coronary artery calcifications. Hepatobiliary: No focal liver abnormality. No gallstones, gallbladder wall thickening, or  pericholecystic fluid. No biliary dilatation. Pancreas: No focal lesion. Normal pancreatic contour. No surrounding inflammatory changes. No main pancreatic ductal dilatation. Spleen: Normal in size without focal abnormality. Adrenals/Urinary Tract: No adrenal nodule bilaterally. Bilateral kidneys enhance symmetrically. Interval increase in size of a 2.9 x 2.3 cm (from 2.1 x 1.7cm in 2016) fluid density lesion within the right kidney that likely represent simple renal cysts. Subcentimeter hypodensities are too small to characterize. Bilateral nephrolithiasis measuring up to 339mon the right and 72m21mn the left. There is an 8mm772mlcification within the left renal pelvis. No hydronephrosis. No hydroureter. The urinary bladder is unremarkable. Stomach/Bowel: Stomach is within normal  limits. No evidence of bowel wall thickening or dilatation. Few scattered colonic diverticula. Status post appendectomy. Vascular/Lymphatic: No abdominal aorta or iliac aneurysm. Mild atherosclerotic plaque of the aorta and its branches. No abdominal, pelvic, or inguinal lymphadenopathy. Reproductive: Prostate is unremarkable. Other: No intraperitoneal free fluid. No intraperitoneal free gas. No organized fluid collection. Musculoskeletal: No abdominal wall hernia or abnormality. No suspicious lytic or blastic osseous lesions. No acute displaced fracture. Multilevel degenerative changes of the spine. IMPRESSION: 1. Nonobstructive bilateral nephrolithiasis with an 45m calcification within the left renal pelvis. 2. Few scattered colonic diverticula with no acute diverticulitis. 3.  Aortic Atherosclerosis (ICD10-I70.0). Electronically Signed   By: MIven FinnM.D.   On: 10/16/2021 21:11    Procedures Procedures    Medications Ordered in ED Medications  iohexol (OMNIPAQUE) 300 MG/ML solution 100 mL (100 mLs Intravenous Contrast Given 10/16/21 2044)    ED Course/ Medical Decision Making/ A&P                           Medical  Decision Making 61year old male without history of hematuria who presents with gross hematuria today without associated pain.  Hypertensive on intake and vital signs of normal.  Cardiopulmonary and abdominal exams are benign.  Patient neurovascular tact in all 4 extremities.  Normal GU exam with chaperone present.  Differential diagnosis for hematuria includes is limited to nephrolithiasis, ureterolithiasis, glomerulonephritis, BPH, neoplasm, UTI.   Amount and/or Complexity of Data Reviewed Labs: ordered.    Details: CBC without leukocytosis or anemia.  CMP with mild hypokalemia of 3.2.  Lipase is normal.  UA with large amount of hemoglobin, yellow in color, proteinuria, trace leukocytes, rare bacteria and nitrate negative.  Calcium oxalate crystals present in the urine. Radiology: ordered and independent interpretation performed.    Details: CT the abdomen pelvis was ordered.  Images were visualized by this provider.  Nonobstructive bilateral nephrolithiasis with 8 mm calcification in the left renal pelvis without other acute abdominal pelvic abnormality.  Risk Prescription drug management.   Patient reevaluated and continues to be asymptomatic at this time.  Given normal renal function on labs today and a benign abdominal exam, do not feel any further work-up is warranted in the emergency department this time.  We will have patient follow-up in the outpatient setting with urology given changes seen on UA.  Case discussed with attending physician who agrees with disposition plan.   KFerdvoiced understanding his medical evaluation and treatment plan.  Each of his questions answered to his expressed satisfaction.  Return precautions given.  Patient is well-appearing, stable, and was discharged in good condition.    This chart was dictated using voice recognition software, Dragon. Despite the best efforts of this provider to proofread and correct errors, errors may still occur which can  change documentation meaning.  Final Clinical Impression(s) / ED Diagnoses Final diagnoses:  Hematuria, unspecified type    Rx / DC Orders ED Discharge Orders     None         SEmeline Darling PA-C 10/16/21 2234

## 2021-10-16 NOTE — Discharge Instructions (Signed)
You are seen in the ER today for your blood in your urine.  Your physical exam, blood work, and CT scan were very reassuring.  While the exact cause of your blood in your urine is unclear, there does not appear to be any emergent problem at this time.  Please follow-up with the urologist listed below as well as your primary care doctor and return to the ER with any new severe symptoms. ?

## 2021-10-16 NOTE — ED Triage Notes (Signed)
C/O bloody urine started this morning, w/ some abdominal discomfort; endorsed hx of kidney stones.  ?

## 2021-10-18 LAB — URINE CULTURE: Culture: NO GROWTH

## 2021-11-13 ENCOUNTER — Other Ambulatory Visit: Payer: Self-pay | Admitting: Urology

## 2021-11-24 ENCOUNTER — Other Ambulatory Visit: Payer: Self-pay

## 2021-11-24 ENCOUNTER — Encounter (HOSPITAL_BASED_OUTPATIENT_CLINIC_OR_DEPARTMENT_OTHER): Payer: Self-pay | Admitting: Urology

## 2021-11-24 NOTE — Progress Notes (Signed)
Spoke w/ via phone for pre-op interview--- pt ?Lab needs dos---- no              ?Lab results------ no ?COVID test -----patient states asymptomatic no test needed ?Arrive at ------- 0700 on 11-28-2021 ?NPO after MN NO Solid Food.  Clear liquids from MN until--- 0600 ?Med rec completed ?Medications to take morning of surgery ----- robaxin, cymbalta, if needed may take oxycodone ?Diabetic medication -----  n/a ?Patient instructed no nail polish to be worn day of surgery ?Patient instructed to bring photo id and insurance card day of surgery ?Patient aware to have Driver (ride ) / caregiver for 24 hours after surgery --son, chris ?Patient Special Instructions ----- n/a ?Pre-Op special Istructions ----- n/a ?Patient verbalized understanding of instructions that were given at this phone interview. ?Patient denies shortness of breath, chest pain, fever, cough at this phone interview.  ?

## 2021-11-28 ENCOUNTER — Ambulatory Visit (HOSPITAL_BASED_OUTPATIENT_CLINIC_OR_DEPARTMENT_OTHER): Payer: Medicaid Other | Admitting: Anesthesiology

## 2021-11-28 ENCOUNTER — Ambulatory Visit (HOSPITAL_BASED_OUTPATIENT_CLINIC_OR_DEPARTMENT_OTHER)
Admission: RE | Admit: 2021-11-28 | Discharge: 2021-11-28 | Disposition: A | Discharge status: Home / Self Care | Payer: Medicaid Other | Attending: Urology | Admitting: Urology

## 2021-11-28 ENCOUNTER — Encounter (HOSPITAL_BASED_OUTPATIENT_CLINIC_OR_DEPARTMENT_OTHER)
Admission: RE | Disposition: A | Discharge status: Home / Self Care | Payer: Self-pay | Source: Home / Self Care | Attending: Urology

## 2021-11-28 ENCOUNTER — Encounter (HOSPITAL_BASED_OUTPATIENT_CLINIC_OR_DEPARTMENT_OTHER): Payer: Self-pay | Admitting: Urology

## 2021-11-28 DIAGNOSIS — N201 Calculus of ureter: Secondary | ICD-10-CM | POA: Insufficient documentation

## 2021-11-28 DIAGNOSIS — Z87891 Personal history of nicotine dependence: Secondary | ICD-10-CM | POA: Insufficient documentation

## 2021-11-28 DIAGNOSIS — Z79899 Other long term (current) drug therapy: Secondary | ICD-10-CM | POA: Insufficient documentation

## 2021-11-28 DIAGNOSIS — Z6837 Body mass index (BMI) 37.0-37.9, adult: Secondary | ICD-10-CM | POA: Diagnosis not present

## 2021-11-28 DIAGNOSIS — N400 Enlarged prostate without lower urinary tract symptoms: Secondary | ICD-10-CM | POA: Diagnosis not present

## 2021-11-28 DIAGNOSIS — M545 Low back pain, unspecified: Secondary | ICD-10-CM | POA: Insufficient documentation

## 2021-11-28 DIAGNOSIS — E669 Obesity, unspecified: Secondary | ICD-10-CM | POA: Insufficient documentation

## 2021-11-28 DIAGNOSIS — R310 Gross hematuria: Secondary | ICD-10-CM | POA: Insufficient documentation

## 2021-11-28 DIAGNOSIS — K219 Gastro-esophageal reflux disease without esophagitis: Secondary | ICD-10-CM | POA: Insufficient documentation

## 2021-11-28 HISTORY — PX: CYSTOSCOPY WITH RETROGRADE PYELOGRAM, URETEROSCOPY AND STENT PLACEMENT: SHX5789

## 2021-11-28 HISTORY — DX: Other disturbances of smell and taste: R43.8

## 2021-11-28 HISTORY — DX: Sleep apnea, unspecified: G47.30

## 2021-11-28 HISTORY — DX: Post covid-19 condition, unspecified: U09.9

## 2021-11-28 HISTORY — DX: Nocturia: R35.1

## 2021-11-28 HISTORY — DX: Other chronic pain: G89.29

## 2021-11-28 HISTORY — DX: Calculus of kidney: N20.0

## 2021-11-28 HISTORY — DX: Gross hematuria: R31.0

## 2021-11-28 HISTORY — DX: Benign paroxysmal vertigo, bilateral: H81.13

## 2021-11-28 HISTORY — PX: HOLMIUM LASER APPLICATION: SHX5852

## 2021-11-28 HISTORY — DX: Mixed hyperlipidemia: E78.2

## 2021-11-28 HISTORY — DX: Low back pain, unspecified: M54.50

## 2021-11-28 SURGERY — CYSTOURETEROSCOPY, WITH RETROGRADE PYELOGRAM AND STENT INSERTION
Anesthesia: General | Site: Ureter | Laterality: Bilateral

## 2021-11-28 MED ORDER — IOHEXOL 300 MG/ML  SOLN
INTRAMUSCULAR | Status: DC | PRN
  Administered 2021-11-28: 5 mL

## 2021-11-28 MED ORDER — KETOROLAC TROMETHAMINE 30 MG/ML IJ SOLN
INTRAMUSCULAR | Status: AC
  Filled 2021-11-28: qty 1

## 2021-11-28 MED ORDER — LACTATED RINGERS IV SOLN: INTRAVENOUS | Status: DC

## 2021-11-28 MED ORDER — OXYCODONE HCL 5 MG/5ML PO SOLN: 5.0000 mg | Freq: Once | ORAL | Status: AC | PRN

## 2021-11-28 MED ORDER — MIDAZOLAM HCL 2 MG/2ML IJ SOLN
INTRAMUSCULAR | Status: DC | PRN
  Administered 2021-11-28: 2 mg via INTRAVENOUS

## 2021-11-28 MED ORDER — CEFAZOLIN SODIUM-DEXTROSE 2-4 GM/100ML-% IV SOLN
INTRAVENOUS | Status: AC
  Filled 2021-11-28: qty 100

## 2021-11-28 MED ORDER — ONDANSETRON HCL 4 MG/2ML IJ SOLN: 4.0000 mg | Freq: Once | INTRAMUSCULAR | Status: DC | PRN

## 2021-11-28 MED ORDER — OXYCODONE HCL 5 MG PO TABS
ORAL_TABLET | ORAL | Status: AC
  Filled 2021-11-28: qty 1

## 2021-11-28 MED ORDER — SODIUM CHLORIDE 0.9 % IR SOLN
Status: DC | PRN
Start: 2021-11-28 — End: 2021-11-28
  Administered 2021-11-28: 3000 mL

## 2021-11-28 MED ORDER — PROPOFOL 10 MG/ML IV BOLUS
INTRAVENOUS | Status: DC | PRN
  Administered 2021-11-28: 260 mg via INTRAVENOUS

## 2021-11-28 MED ORDER — OXYCODONE HCL 5 MG PO TABS: 5.0000 mg | ORAL_TABLET | Freq: Three times a day (TID) | ORAL | 0 refills | Status: AC | PRN

## 2021-11-28 MED ORDER — 0.9 % SODIUM CHLORIDE (POUR BTL) OPTIME
TOPICAL | Status: DC | PRN
  Administered 2021-11-28: 500 mL

## 2021-11-28 MED ORDER — ONDANSETRON HCL 4 MG/2ML IJ SOLN
INTRAMUSCULAR | Status: DC | PRN
  Administered 2021-11-28: 4 mg via INTRAVENOUS

## 2021-11-28 MED ORDER — OXYCODONE HCL 5 MG PO TABS
5.0000 mg | ORAL_TABLET | Freq: Once | ORAL | Status: AC | PRN
  Administered 2021-11-28: 5 mg via ORAL

## 2021-11-28 MED ORDER — FENTANYL CITRATE (PF) 250 MCG/5ML IJ SOLN
INTRAMUSCULAR | Status: DC | PRN
  Administered 2021-11-28: 50 ug via INTRAVENOUS

## 2021-11-28 MED ORDER — FENTANYL CITRATE (PF) 100 MCG/2ML IJ SOLN: 25.0000 ug | INTRAMUSCULAR | Status: DC | PRN

## 2021-11-28 MED ORDER — LIDOCAINE 2% (20 MG/ML) 5 ML SYRINGE
INTRAMUSCULAR | Status: DC | PRN
  Administered 2021-11-28: 100 mg via INTRAVENOUS

## 2021-11-28 MED ORDER — PHENYLEPHRINE 80 MCG/ML (10ML) SYRINGE FOR IV PUSH (FOR BLOOD PRESSURE SUPPORT)
PREFILLED_SYRINGE | INTRAVENOUS | Status: DC | PRN
  Administered 2021-11-28 (×2): 40 ug via INTRAVENOUS

## 2021-11-28 MED ORDER — TAMSULOSIN HCL 0.4 MG PO CAPS
0.4000 mg | ORAL_CAPSULE | Freq: Every day | ORAL | 0 refills | Status: DC
Start: 2021-11-28 — End: 2024-06-02

## 2021-11-28 MED ORDER — PROPOFOL 10 MG/ML IV BOLUS
INTRAVENOUS | Status: AC
  Filled 2021-11-28: qty 20

## 2021-11-28 MED ORDER — FENTANYL CITRATE (PF) 100 MCG/2ML IJ SOLN
INTRAMUSCULAR | Status: AC
  Filled 2021-11-28: qty 2

## 2021-11-28 MED ORDER — MIDAZOLAM HCL 2 MG/2ML IJ SOLN
INTRAMUSCULAR | Status: AC
  Filled 2021-11-28: qty 2

## 2021-11-28 MED ORDER — CIPROFLOXACIN HCL 500 MG PO TABS
500.0000 mg | ORAL_TABLET | Freq: Once | ORAL | 0 refills | Status: AC
Start: 2021-11-28 — End: 2021-11-28

## 2021-11-28 MED ORDER — PROPOFOL 10 MG/ML IV BOLUS
INTRAVENOUS | Status: AC
Start: 2021-11-28 — End: ?
  Filled 2021-11-28: qty 20

## 2021-11-28 MED ORDER — KETOROLAC TROMETHAMINE 30 MG/ML IJ SOLN
30.0000 mg | Freq: Once | INTRAMUSCULAR | Status: AC | PRN
  Administered 2021-11-28: 30 mg via INTRAVENOUS

## 2021-11-28 MED ORDER — CEFAZOLIN SODIUM-DEXTROSE 2-4 GM/100ML-% IV SOLN
2.0000 g | INTRAVENOUS | Status: AC
  Administered 2021-11-28: 2 g via INTRAVENOUS

## 2021-11-28 MED ORDER — DEXAMETHASONE SODIUM PHOSPHATE 10 MG/ML IJ SOLN
INTRAMUSCULAR | Status: DC | PRN
  Administered 2021-11-28: 10 mg via INTRAVENOUS

## 2021-11-28 SURGICAL SUPPLY — 23 items
BAG DRAIN URO-CYSTO SKYTR STRL (DRAIN) ×3 IMPLANT
BAG DRN UROCATH (DRAIN) ×2
BASKET ZERO TIP NITINOL 2.4FR (BASKET) ×1 IMPLANT
BSKT STON RTRVL ZERO TP 2.4FR (BASKET) ×2
CATH URET 5FR 28IN OPEN ENDED (CATHETERS) ×3 IMPLANT
CLOTH BEACON ORANGE TIMEOUT ST (SAFETY) ×3 IMPLANT
DRSG TEGADERM 2-3/8X2-3/4 SM (GAUZE/BANDAGES/DRESSINGS) ×1 IMPLANT
DRSG TEGADERM 4X4.75 (GAUZE/BANDAGES/DRESSINGS) IMPLANT
EXTRACTOR STONE 1.7FRX115CM (UROLOGICAL SUPPLIES) IMPLANT
GLOVE BIO SURGEON STRL SZ 6.5 (GLOVE) ×3 IMPLANT
GOWN STRL REUS W/TWL LRG LVL3 (GOWN DISPOSABLE) ×3 IMPLANT
GUIDEWIRE STR DUAL SENSOR (WIRE) ×4 IMPLANT
IV NS IRRIG 3000ML ARTHROMATIC (IV SOLUTION) ×4 IMPLANT
KIT TURNOVER CYSTO (KITS) ×3 IMPLANT
MANIFOLD NEPTUNE II (INSTRUMENTS) ×3 IMPLANT
NS IRRIG 500ML POUR BTL (IV SOLUTION) ×1 IMPLANT
PACK CYSTO (CUSTOM PROCEDURE TRAY) ×3 IMPLANT
SHEATH URETERAL 12FRX35CM (MISCELLANEOUS) ×1 IMPLANT
STENT URET 6FRX28 CONTOUR (STENTS) ×1 IMPLANT
TRACTIP FLEXIVA PULS ID 200XHI (Laser) IMPLANT
TRACTIP FLEXIVA PULSE ID 200 (Laser) ×3
TUBE CONNECTING 12X1/4 (SUCTIONS) ×3 IMPLANT
TUBING UROLOGY SET (TUBING) ×3 IMPLANT

## 2021-11-28 NOTE — Anesthesia Preprocedure Evaluation (Signed)
Anesthesia Evaluation  ?Patient identified by MRN, date of birth, ID band ?Patient awake ? ? ? ?Reviewed: ?Allergy & Precautions, NPO status , Patient's Chart, lab work & pertinent test results ? ?Airway ?Mallampati: II ? ?TM Distance: >3 FB ?Neck ROM: Full ? ? ? Dental ?no notable dental hx. ? ?  ?Pulmonary ?neg pulmonary ROS, former smoker,  ?  ?Pulmonary exam normal ?breath sounds clear to auscultation ? ? ? ? ? ? Cardiovascular ?negative cardio ROS ?Normal cardiovascular exam ?Rhythm:Regular Rate:Normal ? ? ?  ?Neuro/Psych ?negative neurological ROS ? negative psych ROS  ? GI/Hepatic ?Neg liver ROS, GERD  Medicated,  ?Endo/Other  ?obesity ? Renal/GU ?negative Renal ROS  ?negative genitourinary ?  ?Musculoskeletal ?negative musculoskeletal ROS ?(+)  ? Abdominal ?  ?Peds ?negative pediatric ROS ?(+)  Hematology ?negative hematology ROS ?(+)   ?Anesthesia Other Findings ? ? Reproductive/Obstetrics ?negative OB ROS ? ?  ? ? ? ? ? ? ? ? ? ? ? ? ? ?  ?  ? ? ? ? ? ? ? ? ?Anesthesia Physical ?Anesthesia Plan ? ?ASA: 2 ? ?Anesthesia Plan: General  ? ?Post-op Pain Management: Minimal or no pain anticipated  ? ?Induction: Intravenous ? ?PONV Risk Score and Plan: 2 and Ondansetron, Dexamethasone and Treatment may vary due to age or medical condition ? ?Airway Management Planned: LMA ? ?Additional Equipment:  ? ?Intra-op Plan:  ? ?Post-operative Plan: Extubation in OR ? ?Informed Consent: I have reviewed the patients History and Physical, chart, labs and discussed the procedure including the risks, benefits and alternatives for the proposed anesthesia with the patient or authorized representative who has indicated his/her understanding and acceptance.  ? ? ? ?Dental advisory given ? ?Plan Discussed with: CRNA and Surgeon ? ?Anesthesia Plan Comments:   ? ? ? ? ? ? ?Anesthesia Quick Evaluation ? ?

## 2021-11-28 NOTE — Transfer of Care (Signed)
Immediate Anesthesia Transfer of Care Note ? ?Patient: TONIO SEIDER ? ?Procedure(s) Performed: CYSTOSCOPY WITH RETROGRADE PYELOGRAM, URETEROSCOPY AND STENT PLACEMENT (Bilateral: Bladder) ?HOLMIUM LASER APPLICATION (Bilateral: Ureter) ? ?Patient Location: PACU ? ?Anesthesia Type:General ? ?Level of Consciousness: drowsy ? ?Airway & Oxygen Therapy: Patient Spontanous Breathing ? ?Post-op Assessment: Report given to RN and Post -op Vital signs reviewed and stable ? ?Post vital signs: Reviewed and stable ? ?Last Vitals:  ?Vitals Value Taken Time  ?BP 135/90 11/28/21 0947  ?Temp 36.2 ?C 11/28/21 0947  ?Pulse 72 11/28/21 0948  ?Resp 14 11/28/21 0948  ?SpO2 96 % 11/28/21 0948  ?Vitals shown include unvalidated device data. ? ?Last Pain:  ?Vitals:  ? 11/28/21 0744  ?TempSrc: Oral  ?PainSc: 7   ?   ? ?Patients Stated Pain Goal: 7 (11/28/21 0744) ? ?Complications: No notable events documented. ?

## 2021-11-28 NOTE — H&P (Signed)
CC/HPI: cc: gross hematuria, hx of urolithiasis  ? ?10/18/21: 61 year old man presenting to ED with gross hematuria. He does have a history of urolithiasis and CT at that time did not show any obstructive uropathy or ureteral calculi but did show bilateral nonobstructing stones and 8 mm renal pelvis stone. He has undergone stent placement followed by [facility] in the past. He denies any flank pain. He has a remote tobacco history as a teenager. He does have low back pain for which he takes Percocet. He is noted some intermittent right low back pain but is wondering if is in the kidney area. He denies any family history of kidney or bladder cancer.  ? ?11/08/21: Here for cystoscopy; he saw light pink urine a few days ago. He did not leave a urine sample today. He continues to have right flank pain.  ? ?  ?ALLERGIES: No Known Allergies ?  ? ?MEDICATIONS: Aspirin 81 mg tablet,chewable  ?Omeprazole 40 mg capsule,delayed release  ?Percocet 10 mg-325 mg tablet  ?Duloxetine Hcl 20 mg capsule,delayed release  ?Meloxicam 15 mg tablet  ?  ? ?GU PSH: No GU PSH   ?   ?PSH Notes: Discectomy,Bone spur rt 2015  ? ?NON-GU PSH: Appendectomy ?Knee replacement ? ?  ? ?GU PMH: Gross hematuria - 10/18/2021 ?Renal calculus - 10/18/2021 ?  ? ?NON-GU PMH: Encounter for general adult medical examination without abnormal findings, Encounter for preventive health examination ?Sleep Apnea ?  ? ?FAMILY HISTORY: Calcium kidney stones - Runs in Family  ? ?SOCIAL HISTORY: Marital Status: Divorced ?Preferred Language: [place]; Ethnicity: Not Hispanic Or Latino; Race: White ?Current Smoking Status: Patient does not smoke anymore.  ? ?Tobacco Use Assessment Completed: Used Tobacco in last 30 days? ?  ? ?REVIEW OF SYSTEMS:    ?GU Review Male:   Patient denies frequent urination, hard to postpone urination, burning/ pain with urination, get up at night to urinate, leakage of urine, stream starts and stops, trouble starting your stream, have to strain to urinate  , erection problems, and penile pain.  ?Gastrointestinal (Upper):   Patient denies nausea, vomiting, and indigestion/ heartburn.  ?Gastrointestinal (Lower):   Patient denies diarrhea and constipation.  ?Constitutional:   Patient denies fever, night sweats, weight loss, and fatigue.  ?Skin:   Patient denies skin rash/ lesion and itching.  ?Eyes:   Patient denies double vision and blurred vision.  ?Ears/ Nose/ Throat:   Patient denies sore throat and sinus problems.  ?Hematologic/Lymphatic:   Patient denies swollen glands and easy bruising.  ?Cardiovascular:   Patient denies leg swelling and chest pains.  ?Respiratory:   Patient denies cough and shortness of breath.  ?Endocrine:   Patient denies excessive thirst.  ?Musculoskeletal:   Patient denies back pain and joint pain.  ?Neurological:   Patient denies headaches and dizziness.  ?Psychologic:   Patient denies depression and anxiety.  ? ?VITAL SIGNS: None  ? ?MULTI-SYSTEM PHYSICAL EXAMINATION:    ?Constitutional: Well-nourished. No physical deformities. Normally developed. Good grooming.  ?Neck: Neck symmetrical, not swollen. Normal tracheal position.  ?Respiratory: No labored breathing, no use of accessory muscles.   ?Skin: No paleness, no jaundice, no cyanosis. No lesion, no ulcer, no rash.  ?Neurologic / Psychiatric: Oriented to time, oriented to place, oriented to person. No depression, no anxiety, no agitation.  ?Eyes: Normal conjunctivae. Normal eyelids.  ?Ears, Nose, Mouth, and Throat: Left ear no scars, no lesions, no masses. Right ear no scars, no lesions, no masses. Nose no scars, no lesions, no  masses. Normal hearing. Normal lips.  ?Musculoskeletal: Normal gait and station of head and neck.  ? ?  ?Complexity of Data:  ?Records Review:   Previous Patient Records, POC Tool  ?Urodynamics Review:   Review Bladder Scan, Review Flow Rate  ? 11/02/21  ?PSA  ?Total PSA 1.42 ng/mL  ? ? ?PROCEDURES:    ?     Flexible Cystoscopy - 52000  ?Risks, benefits, and some  of the potential complications of the procedure were discussed at length with the patient including infection, bleeding, voiding discomfort, urinary retention, fever, chills, sepsis, and others. All questions were answered. Informed consent was obtained. Sterile technique and intraurethral analgesia were used.  ?Meatus:  Normal size. Normal location. Normal condition.  ?Urethra:  No strictures.  ?External Sphincter:  Normal.  ?Verumontanum:  Normal.  ?Prostate:  Obstructing lateral lobes, no intravesical median lobe  ?Bladder Neck:  Non-obstructing.  ?Ureteral Orifices:  Normal location. Normal size. Normal shape. Effluxed clear urine.  ?Bladder:  No trabeculation. No tumors. Normal mucosa. No stones. Significant debris in bladder  ?  ?  ?The lower urinary tract was carefully examined. The procedure was well-tolerated and without complications. Antibiotic instructions were given. Instructions were given to call the office immediately for bloody urine, difficulty urinating, urinary retention, painful or frequent urination, fever, chills, nausea, vomiting or other illness. The patient stated that he understood these instructions and would comply with them.  ? ?     Flow Rate - 51741  ?Average Flow Rate: 9 cc/sec  ?Voided Volume: 166 cc  ?Time of Peak Flow: 0:05 min:sec  ?Total Void Time: 0:21 min:sec  ?Peak Flow Rate: 15 cc/sec  ?Flow Time: 0:17 min:sec  ? ? ?  ?     PVR Ultrasound - 29924  ?Scanned Volume: 22 cc  ? ?ASSESSMENT:  ?    ICD-10 Details  ?1 GU:   Gross hematuria - R31.0 Undiagnosed New Problem  ?2   Renal calculus - N20.0 Chronic, Stable  ? ?PLAN:    ? ?      Document ?Letter(s):  Created for Patient: Clinical Summary  ? ? ?     Notes:   Cystoscopy without source of gross hematuria. His prostate is obstructing with kissing lobes laterally. There was significant amount of debris in the bladder but no obvious bladder tumor. No bloody E flux noted. Patient does have a left nonobstructing renal calculus. We  discussed proceeding with diagnostic bilateral ureteroscopy with laser lithotripsy, retrograde pyelogram and stent placement. This would allow [place] to take care of the left renal calculus as well as further evaluate for any gross hematuria. Risks and benefits of the above procedure discussed with the patient in detail and he has elected to proceed. He will be scheduled for next fill date.  ? ?

## 2021-11-28 NOTE — Anesthesia Postprocedure Evaluation (Signed)
Anesthesia Post Note ? ?Patient: Nicholas Horn ? ?Procedure(s) Performed: CYSTOSCOPY WITH RETROGRADE PYELOGRAM, URETEROSCOPY AND STENT PLACEMENT (Bilateral: Bladder) ?HOLMIUM LASER APPLICATION (Bilateral: Ureter) ? ?  ? ?Patient location during evaluation: PACU ?Anesthesia Type: General ?Level of consciousness: awake and alert ?Pain management: pain level controlled ?Vital Signs Assessment: post-procedure vital signs reviewed and stable ?Respiratory status: spontaneous breathing, nonlabored ventilation, respiratory function stable and patient connected to nasal cannula oxygen ?Cardiovascular status: blood pressure returned to baseline and stable ?Postop Assessment: no apparent nausea or vomiting ?Anesthetic complications: no ? ? ?No notable events documented. ? ?Last Vitals:  ?Vitals:  ? 11/28/21 1000 11/28/21 1012  ?BP: (!) 135/91   ?Pulse: 64   ?Resp: 17 20  ?Temp:    ?SpO2: 96% 96%  ?  ?Last Pain:  ?Vitals:  ? 11/28/21 0947  ?TempSrc:   ?PainSc: 0-No pain  ? ? ?  ?  ?  ?  ?  ?  ? ?Iyanni Hepp S ? ? ? ? ?

## 2021-11-28 NOTE — Op Note (Signed)
Preoperative diagnosis: left ureteral calculus ? ?Postoperative diagnosis: left ureteral calculus ? ?Procedure: ? ?Cystoscopy ?left ureteroscopy, laser lithotripsy, basket stone extraction ?left 16F x 28 ureteral stent placement  - with tether ?left retrograde pyelography with interpretation ? ?Surgeon: Jacalyn Lefevre, MD ? ?Anesthesia: General ? ?Complications: None ? ?Intraoperative findings:  ?Normal urethra ?Bilateral lobe hypertrophy prostatic urethra ?Bilateral orthotropic ureteral orifices ?left retrograde pyelography demonstrated a filling defect within the left ureter consistent with the patient?s known calculus without other abnormalities. ?Bladder mucosa normal without masses  ? ?EBL: Minimal ? ?Specimens: ?left ureteral calculus ? ?Disposition of specimens: [facility] for stone analysis ? ?Indication: Nicholas Horn is a 61 y.o.   patient with a 49m left ureteral stone and associated gross hematuria. After reviewing the management options for treatment, the patient elected to proceed with the above surgical procedure(s). We have discussed the potential benefits and risks of the procedure, side effects of the proposed treatment, the likelihood of the patient achieving the goals of the procedure, and any potential problems that might occur during the procedure or recuperation. Informed consent has been obtained. ? ? ?Description of procedure: ? ?The patient was taken to the operating room and general anesthesia was induced.  The patient was placed in the dorsal lithotomy position, prepped and draped in the usual sterile fashion, and preoperative antibiotics were administered. A preoperative time-out was performed.  ? ?Cystourethroscopy was performed.  The patient?s urethra was examined and demonstrated bilobar prostatic hypertrophy. The bladder was then systematically examined in its entirety. There was no evidence for any bladder tumors, stones, or other mucosal pathology.   ? ?Attention  then turned to the  ureteral orifice and a ureteral catheter was used to intubate the ureteral orifice.  Omnipaque contrast was injected through the ureteral catheter and a retrograde pyelogram was performed with findings as dictated above. ? ?A 0.38 sensor wire was then advanced through the ureteral catheter and the ureteral catheter was removed.  A second wire was then advanced alongside the first sensor wire with fluoroscopic guidance.  Next a ureteral access sheath was advanced over one of the wires with fluoroscopic guidance into the mid ureter.  The inner sheath and wire removed.  Digital ureteroscope was then assembled.  An 8 mm calculus was then seen in the mid to proximal ureter. ? ? The stone was then fragmented with the 242 micron holmium laser.  All fragments were less than 2 mm in size.  The larger pieces were removed with 0 tip basket.  ? ?The ureteroscope was then removed and unison with the ureteral access sheath taking care to examine the ureter on the way out.  There is no trauma or stone fragments left in the ureter. ? ? ?The wire was then backloaded through the cystoscope and a ureteral stent was advance over the wire using Seldinger technique.  The stent was positioned appropriately under fluoroscopic and cystoscopic guidance.  The wire was then removed with an adequate stent curl noted in the renal pelvis as well as in the bladder. ? ?The bladder was then emptied and the procedure ended.  The patient appeared to tolerate the procedure well and without complications.  The patient was able to be awakened and transferred to the recovery unit in satisfactory condition.  ? ?Disposition: The tether of the stent was left on and secured to the ventral aspect of the patient's penis.  Instructions for removing the stent have been provided to the patient.   ?

## 2021-11-28 NOTE — Interval H&P Note (Signed)
History and Physical Interval Note: ? ?11/28/2021 ?8:33 AM ? ?Nicholas Horn  has presented today for surgery, with the diagnosis of LEFT RENAL CALCULUS, GROSS HEMATURIA.  The various methods of treatment have been discussed with the patient and family. After consideration of risks, benefits and other options for treatment, the patient has consented to  Procedure(s) with comments: ?[place], URETEROSCOPY AND STENT PLACEMENT (Bilateral) - 54 MINS ?HOLMIUM LASER APPLICATION (Bilateral) as a surgical intervention.  The patient's history has been reviewed, patient examined, no change in status, stable for surgery.  I have reviewed the patient's chart and labs.  Questions were answered to the patient's satisfaction.   ? ? ?Deshone Lyssy D Walda Hertzog ? ? ?

## 2021-11-28 NOTE — Discharge Instructions (Addendum)
DISCHARGE INSTRUCTIONS FOR KIDNEY STONE/URETERAL STENT  ? ?MEDICATIONS:  ?1. Resume all your other meds from home  ?2. AZO over the counter can help with the burning/stinging when you urinate. ?3. Oxycodone is for moderate/severe pain, otherwise taking up to 1000 mg every 6 hours of plainTylenol will help treat your pain.   ?4. Take Cipro one hour prior to removal of your stent.  ?5. Tamsulosin can help with stent discomfort ? ? ?ACTIVITY:  ?1. No strenuous activity x 1week  ?2. No driving while on narcotic pain medications  ?3. Drink plenty of water  ?4. Continue to walk at home - you can still get blood clots when you are at home, so keep active, but don't over do it.  ?5. May return to work/school tomorrow or when you feel ready  ? ?BATHING:  ?1. You can shower and we recommend daily showers  ?2. You have a string coming from your urethra: The stent string is attached to your ureteral stent. Do not pull on this.  ? ?SIGNS/SYMPTOMS TO CALL:  ?Please call us if you have a fever greater than 101.5, uncontrolled nausea/vomiting, uncontrolled pain, dizziness, unable to urinate, bloody urine, chest pain, shortness of breath, leg swelling, leg pain, redness around wound, drainage from wound, or any other concerns or questions.  ? ?You can reach [place] at [phone].  ? ?FOLLOW-UP:  ?1. You have a string attached to your stent, you may remove it on Monday, May 1. To do this, pull the string until the stent is completely removed. You may feel an odd sensation in your back.  ?Post Anesthesia Home Care Instructions ? ?Activity: ?Get plenty of rest for the remainder of the day. A responsible individual must stay with you for 24 hours following the procedure.  ?For the next 24 hours, DO NOT: ?-Drive a car ?-Paediatric nurse ?-Drink alcoholic beverages ?-Take any medication unless instructed by your physician ?-Make any legal decisions or sign important papers. ? ?Meals: ?Start with liquid foods such as gelatin or soup.  Progress to regular foods as tolerated. Avoid greasy, spicy, heavy foods. If nausea and/or vomiting occur, drink only clear liquids until the nausea and/or vomiting subsides. Call your physician if vomiting continues. ? ?Special Instructions/Symptoms: ?Your throat may feel dry or sore from the anesthesia or the breathing tube placed in your throat during surgery. If this causes discomfort, gargle with warm salt water. The discomfort should disappear within 24 hours. ? ?

## 2021-11-28 NOTE — Anesthesia Procedure Notes (Signed)
Procedure Name: LMA Insertion ?Date/Time: 11/28/2021 8:49 AM ?Performed by: Clearnce Sorrel, CRNA ?Pre-anesthesia Checklist: Patient identified, Emergency Drugs available, Suction available and Patient being monitored ?Patient Re-evaluated:Patient Re-evaluated prior to induction ?Oxygen Delivery Method: Circle System Utilized ?Preoxygenation: Pre-oxygenation with 100% oxygen ?Induction Type: IV induction ?Ventilation: Mask ventilation without difficulty ?LMA: LMA inserted ?LMA Size: 5.0 ?Number of attempts: 1 ?Airway Equipment and Method: Bite block ?Placement Confirmation: positive ETCO2 ?Tube secured with: Tape ?Dental Injury: Teeth and Oropharynx as per pre-operative assessment  ? ? ? ? ?

## 2021-11-30 ENCOUNTER — Encounter (HOSPITAL_BASED_OUTPATIENT_CLINIC_OR_DEPARTMENT_OTHER): Payer: Self-pay | Admitting: Urology

## 2022-10-03 ENCOUNTER — Emergency Department (HOSPITAL_COMMUNITY): Payer: Medicaid Other

## 2022-10-03 ENCOUNTER — Emergency Department (HOSPITAL_COMMUNITY)
Admission: EM | Admit: 2022-10-03 | Discharge: 2022-10-03 | Disposition: A | Discharge status: Home / Self Care | Payer: Medicaid Other | Attending: Emergency Medicine | Admitting: Emergency Medicine

## 2022-10-03 DIAGNOSIS — Z7982 Long term (current) use of aspirin: Secondary | ICD-10-CM | POA: Diagnosis not present

## 2022-10-03 DIAGNOSIS — R072 Precordial pain: Secondary | ICD-10-CM | POA: Insufficient documentation

## 2022-10-03 DIAGNOSIS — R079 Chest pain, unspecified: Secondary | ICD-10-CM

## 2022-10-03 DIAGNOSIS — I10 Essential (primary) hypertension: Secondary | ICD-10-CM | POA: Insufficient documentation

## 2022-10-03 DIAGNOSIS — R002 Palpitations: Secondary | ICD-10-CM

## 2022-10-03 DIAGNOSIS — F17200 Nicotine dependence, unspecified, uncomplicated: Secondary | ICD-10-CM | POA: Diagnosis not present

## 2022-10-03 LAB — D-DIMER, QUANTITATIVE: D-Dimer, Quant: 0.34 ug/mL-FEU (ref 0.00–0.50)

## 2022-10-03 LAB — CBC
HCT: 50.7 % (ref 39.0–52.0)
Hemoglobin: 17 g/dL (ref 13.0–17.0)
MCH: 31.3 pg (ref 26.0–34.0)
MCHC: 33.5 g/dL (ref 30.0–36.0)
MCV: 93.4 fL (ref 80.0–100.0)
Platelets: 207 10*3/uL (ref 150–400)
RBC: 5.43 MIL/uL (ref 4.22–5.81)
RDW: 12.9 % (ref 11.5–15.5)
WBC: 6.6 10*3/uL (ref 4.0–10.5)
nRBC: 0 % (ref 0.0–0.2)

## 2022-10-03 LAB — BASIC METABOLIC PANEL
Anion gap: 11 (ref 5–15)
BUN: 11 mg/dL (ref 6–20)
CO2: 24 mmol/L (ref 22–32)
Calcium: 9.6 mg/dL (ref 8.9–10.3)
Chloride: 104 mmol/L (ref 98–111)
Creatinine, Ser: 1.29 mg/dL — ABNORMAL HIGH (ref 0.61–1.24)
GFR, Estimated: >60 mL/min (ref >60)
Glucose, Bld: 95 mg/dL (ref 70–99)
Potassium: 3.6 mmol/L (ref 3.5–5.1)
Sodium: 139 mmol/L (ref 135–145)

## 2022-10-03 LAB — TROPONIN I (HIGH SENSITIVITY)
Troponin I (High Sensitivity): 8 ng/L (ref <18)
Troponin I (High Sensitivity): 10 ng/L (ref <18)

## 2022-10-03 NOTE — ED Provider Triage Note (Addendum)
Emergency Medicine Provider Triage Evaluation Note  Nicholas Horn , a 62 y.o. male  was evaluated in triage.  Pt complains of lower sternal chest pain onset this morning. Had a colonoscopy yesterday. No meds tried. Denies MI, stents, diabetes, high blood pressure, high cholesterol.  Had a cardiac catheterization completed 14 years ago.  Has associated palpitations and feeling as if the ear is thick.  Denies nausea, vomiting, abdominal pain..  Review of Systems  Positive:  Negative:   Physical Exam  BP (!) 151/101   Pulse 68   Temp 98.3 F (36.8 C)   Resp 16   SpO2 98%  Gen:   Awake, no distress   Resp:  Normal effort  MSK:   Moves extremities without difficulty  Other:  No chest wall TTP  Medical Decision Making  Medically screening exam initiated at 3:08 PM.  Appropriate orders placed.  Nicholas Horn was informed that the remainder of the evaluation will be completed by another provider, this initial triage assessment does not replace that evaluation, and the importance of remaining in the ED until their evaluation is complete.  Work-up initiated   Nicholas Horn A, PA-C 10/03/22 1509  3:14 PM - Discussed with RN that patient is in need of a room. RN aware and working on room placement.    Nicholas Horn A, PA-C 10/03/22 1514

## 2022-10-03 NOTE — ED Provider Notes (Signed)
[facility] Provider Note   CSN: [id number] Arrival date & time: 10/03/22  1447     History  Chief Complaint  Patient presents with   Chest Pain    Nicholas Horn is a 62 y.o. male.   Chest Pain   62 year old male presents emergency department with complaints of chest pain.  Patient states that chest pain has been present since earlier this morning.  Patient with colonoscopy performed yesterday.  States that chest pain feels sharp and located lower sternal wall just left of sternal.  States he feels "my heart skip/race" when he feels pain.  Pain lasts a matter of a second or 2 before resolved spontaneously.  Denies history of similar symptoms in the past.  States he has hypertension, history of smoking but denies diabetes, high cholesterol.  Denies shortness of breath but states that "the area is thick."  Denies any known exacerbating or relieving factors and states the symptoms are "random."  Denies fever, cough, congestion, abdominal pain, nausea, vomiting, urinary symptoms, change in bowel habits.  Past medical history significant for OSA, mixed hyperlipidemia  Home Medications Prior to Admission medications   Medication Sig Start Date End Date Taking? Authorizing Provider  [facility] ASPIRIN ADULT LOW DOSE 81 MG chewable tablet CHEW 1 TABLET BY MOUTH EVERY DAY Patient taking differently: Chew 81 mg by mouth daily. 06/28/21   Magnant, Charles L, PA-C  DULoxetine (CYMBALTA) 20 MG capsule Take 20 mg by mouth 2 (two) times daily.    [provider]  meloxicam (MOBIC) 15 MG tablet Take 15 mg by mouth daily.    [provider]  methocarbamol (ROBAXIN) 750 MG tablet Take 750 mg by mouth daily.    [provider]  omeprazole (PRILOSEC) 40 MG capsule Take 40 mg by mouth daily as needed.    [provider]  oxyCODONE (ROXICODONE) 5 MG immediate release tablet Take 1 tablet (5 mg total) by mouth every 8 (eight) hours  as needed. 11/28/21 11/28/22  Robley Fries, MD  oxyCODONE-acetaminophen (PERCOCET) 10-325 MG tablet Take 1 tablet by mouth as directed. Per instructions 1 every 1-2 days as needed    [provider]  tamsulosin (FLOMAX) 0.4 MG CAPS capsule Take 1 capsule (0.4 mg total) by mouth at bedtime. 11/28/21   Robley Fries, MD      Allergies    Honey    Review of Systems   Review of Systems  Cardiovascular:  Positive for chest pain.  All other systems reviewed and are negative.   Physical Exam Updated Vital Signs BP (!) 156/103   Pulse 64   Temp 98 F (36.7 C) (Oral)   Resp 15   Ht '6\' 4"'$  (1.93 m)   Wt 130.2 kg   SpO2 96%   BMI 34.93 kg/m  Physical Exam Vitals and nursing note reviewed.  Constitutional:      General: He is not in acute distress.    Appearance: He is well-developed.  HENT:     Head: Normocephalic and atraumatic.     Nose: No congestion or rhinorrhea.  Eyes:     Conjunctiva/sclera: Conjunctivae normal.  Cardiovascular:     Rate and Rhythm: Normal rate and regular rhythm.     Heart sounds: No murmur heard. Pulmonary:     Effort: Pulmonary effort is normal. No respiratory distress.     Breath sounds: Normal breath sounds. No wheezing, rhonchi or rales.  Abdominal:  Palpations: Abdomen is soft.     Tenderness: There is no abdominal tenderness. There is no right CVA tenderness or left CVA tenderness.  Musculoskeletal:        General: No swelling.     Cervical back: Neck supple. No rigidity or tenderness.     Right lower leg: No edema.     Left lower leg: No edema.  Skin:    General: Skin is warm and dry.     Capillary Refill: Capillary refill takes less than 2 seconds.  Neurological:     Mental Status: He is alert.  Psychiatric:        Mood and Affect: Mood normal.     ED Results / Procedures / Treatments   Labs (all labs ordered are listed, but only abnormal results are displayed) Labs Reviewed  BASIC METABOLIC PANEL - Abnormal;  Notable for the following components:      Result Value   Creatinine, Ser 1.29 (*)    All other components within normal limits  CBC  D-DIMER, QUANTITATIVE  TROPONIN I (HIGH SENSITIVITY)  TROPONIN I (HIGH SENSITIVITY)    EKG None  Radiology DG Chest 1 View  Result Date: 10/03/2022 CLINICAL DATA:  Chest pain. EXAM: CHEST  1 VIEW COMPARISON:  07/23/2020. FINDINGS: Clear lungs. Normal heart size and mediastinal contours. No pleural effusion or pneumothorax. Visualized bones and upper abdomen are unremarkable. IMPRESSION: No evidence of acute cardiopulmonary disease. Electronically Signed   By: Emmit Alexanders M.D.   On: 10/03/2022 15:41    Procedures Procedures    Medications Ordered in ED Medications - No data to display  ED Course/ Medical Decision Making/ A&P Clinical Course as of 10/03/22 2022  Wed Oct 03, 2022  1721 DG Chest 1 View [CR]    Clinical Course User Index [CR] Wilnette Kales, [place]                             Medical Decision Making Amount and/or Complexity of Data Reviewed Labs: ordered. Radiology:  Decision-making details documented in ED Course.   This patient presents to the ED for concern of chest pain, this involves an extensive number of treatment options, and is a complaint that carries with it a high risk of complications and morbidity.  The differential diagnosis includes ACS, PE, pneumonia, CHF, pneumothorax, aortic dissection, aortic aneurysm, musculoskeletal pain, GERD   Co morbidities that complicate the patient evaluation  See HPI   Additional history obtained:  Additional history obtained from EMR External records from outside source obtained and reviewed including hospital records   Lab Tests:  I Ordered, and personally interpreted labs.  The pertinent results include: No leukocytosis noted.  No evidence of anemia.  Platelets within normal range.  No electrolyte abnormalities appreciated.  Mild elevation in creatinine of 1.29.   Initial troponin of 8 with repeat pending.  D-dimer pending.   Imaging Studies ordered:  I ordered imaging studies including chest x-ray I independently visualized and interpreted imaging which showed no acute cardiopulmonary abnormalities I agree with the radiologist interpretation   Cardiac Monitoring: / EKG:  The patient was maintained on a cardiac monitor.  I personally viewed and interpreted the cardiac monitored which showed an underlying rhythm of: Sinus rhythm with mild T wave changes   Consultations Obtained:  N/a   Problem List / ED Course / Critical interventions / Medication management  Chest pain Reevaluation of the patient showed that the patient  stayed the same I have reviewed the patients home medicines and have made adjustments as needed   Social Determinants of Health:  Former tobacco use.  Denies illicit drug use.  Test / Admission - Considered:  Chest pain Vitals signs significant for hypertension with blood pressure 156/103. Otherwise within normal range and stable throughout visit. Laboratory/imaging studies significant for: See above Patient presenting with chest pain seemingly isolated to episodes of brief palpitations.  No abnormal rhythm, PAC, PVC appreciated throughout stay.  During time spent with patient, patient never experienced said symptoms so was unable to evaluate electrical activity on heart monitor.  Awaiting second troponin as well as resulting of D-dimer for ACS and PE rule out.  If negative, expect outpatient management with follow-up with cardiology.  At shift change, patient care handed off to Dr. Vallery Ridge.  Patient stable upon shift change.        Final Clinical Impression(s) / ED Diagnoses Final diagnoses:  None    Rx / DC Orders ED Discharge Orders     None         Wilnette Kales, [place] 10/03/22 2022    Charlesetta Shanks, MD 10/05/22 [phone]

## 2022-10-03 NOTE — Discharge Instructions (Signed)
1.  Review instructions palpitations. 2.  Call your doctor to schedule a recheck within the next 3 to 5 days. 3.  Return to the emergency department if you have new or worsening symptoms.

## 2022-10-03 NOTE — ED Triage Notes (Signed)
Pt to ED c/o chest pain that started this morning, intermittent in nature, center of chest. Also reports palpations. denies n/v. Of note pt had a colonoscopy yesterday.

## 2022-10-03 NOTE — ED Provider Notes (Signed)
I provided a substantive portion of the care of this patient.  I personally made/approved the management plan for this patient and take responsibility for the patient management.     Intermittent CP with palpitation.  Patient had a colonoscopy yesterday.  He denies he is having any abdominal pain.  No nausea no vomiting.  Is alert and well in appearance.  Heart is regular.  Lungs are clear.  Abdomen soft without guarding.  Sets of troponins are negative.  D-dimer negative chest x-ray within normal limits.  At this time patient has been on the monitor for several hours and her been no episodes of dysrhythmia.  Patient is stable for discharge.  Return precautions reviewed.  Recommended follow-up with PCP within the next 2 to 4 days.   Charlesetta Shanks, MD 10/03/22 2236

## 2023-06-27 ENCOUNTER — Encounter: Payer: Self-pay | Admitting: Internal Medicine

## 2023-09-30 ENCOUNTER — Other Ambulatory Visit: Payer: Self-pay | Admitting: Urology

## 2023-10-22 NOTE — Patient Instructions (Signed)
 SURGICAL WAITING ROOM VISITATION  Patients having surgery or a procedure may have no more than 2 support people in the waiting area - these visitors may rotate.    Children under the age of 62 must have an adult with them who is not the patient.  Due to an increase in RSV and influenza rates and associated hospitalizations, children ages 27 and under may not visit patients in [facility] hospitals.  Visitors with respiratory illnesses are discouraged from visiting and should remain at home.  If the patient needs to stay at the hospital during part of their recovery, the visitor guidelines for inpatient rooms apply. Pre-op nurse will coordinate an appropriate time for 1 support person to accompany patient in pre-op.  This support person may not rotate.    Please refer to the [facility] website for the visitor guidelines for Inpatients (after your surgery is over and you are in a regular room).       Your procedure is scheduled on:  10/31/23    Report to [facility] Main Entrance    Report to admitting at  0630 AM   Call this number if you have problems the morning of surgery [phone]   Do not eat food  or drink liquids :After Midnight.                 If you have questions, please contact your surgeon's office.      Oral Hygiene is also important to reduce your risk of infection.                                    Remember - BRUSH YOUR TEETH THE MORNING OF SURGERY WITH YOUR REGULAR TOOTHPASTE  DENTURES WILL BE REMOVED PRIOR TO SURGERY PLEASE DO NOT APPLY "Poly grip" OR ADHESIVES!!!   Do NOT smoke after Midnight   Stop all vitamins and herbal supplements 7 days before surgery.   Take these medicines the morning of surgery with A SIP OF WATER:  cymbalta, omeprazole if needed   DO NOT TAKE ANY ORAL DIABETIC MEDICATIONS DAY OF YOUR SURGERY  Bring CPAP mask and tubing day of surgery.                              You may not have any metal on your body  including hair pins, jewelry, and body piercing             Do not wear make-up, lotions, powders, perfumes/cologne, or deodorant  Do not wear nail polish including gel and S&S, artificial/acrylic nails, or any other type of covering on natural nails including finger and toenails. If you have artificial nails, gel coating, etc. that needs to be removed by a nail salon please have this removed prior to surgery or surgery may need to be canceled/ delayed if the surgeon/ anesthesia feels like they are unable to be safely monitored.   Do not shave  48 hours prior to surgery.               Men may shave face and neck.   Do not bring valuables to the hospital. [facility] IS NOT             RESPONSIBLE   FOR VALUABLES.   Contacts, glasses, dentures or bridgework may not be worn into surgery.   Bring small overnight  bag day of surgery.   DO NOT BRING YOUR HOME MEDICATIONS TO THE HOSPITAL. PHARMACY WILL DISPENSE MEDICATIONS LISTED ON YOUR MEDICATION LIST TO YOU DURING YOUR ADMISSION IN THE HOSPITAL!    Patients discharged on the day of surgery will not be allowed to drive home.  Someone NEEDS to stay with you for the first 24 hours after anesthesia.   Special Instructions: Bring a copy of your healthcare power of attorney and living will documents the day of surgery if you haven't scanned them before.              Please read over the following fact sheets you were given: IF YOU HAVE QUESTIONS ABOUT YOUR PRE-OP INSTRUCTIONS PLEASE CALL [phone]   If you received a COVID test during your pre-op visit  it is requested that you wear a mask when out in public, stay away from anyone that may not be feeling well and notify your surgeon if you develop symptoms. If you test positive for Covid or have been in contact with anyone that has tested positive in the last 10 days please notify you surgeon.    [name] - Preparing for Surgery Before surgery, you can play an important role.  Because skin  is not sterile, your skin needs to be as free of germs as possible.  You can reduce the number of germs on your skin by washing with CHG (chlorahexidine gluconate) soap before surgery.  CHG is an antiseptic cleaner which kills germs and bonds with the skin to continue killing germs even after washing. Please DO NOT use if you have an allergy to CHG or antibacterial soaps.  If your skin becomes reddened/irritated stop using the CHG and inform your nurse when you arrive at Short Stay. Do not shave (including legs and underarms) for at least 48 hours prior to the first CHG shower.  You may shave your face/neck. Please follow these instructions carefully:  1.  Shower with CHG Soap the night before surgery and the  morning of Surgery.  2.  If you choose to wash your hair, wash your hair first as usual with your  normal  shampoo.  3.  After you shampoo, rinse your hair and body thoroughly to remove the  shampoo.                           4.  Use CHG as you would any other liquid soap.  You can apply chg directly  to the skin and wash                       Gently with a scrungie or clean washcloth.  5.  Apply the CHG Soap to your body ONLY FROM THE NECK DOWN.   Do not use on face/ open                           Wound or open sores. Avoid contact with eyes, ears mouth and genitals (private parts).                       Wash face,  Genitals (private parts) with your normal soap.             6.  Wash thoroughly, paying special attention to the area where your surgery  will be performed.  7.  Thoroughly rinse your body with warm water  from the neck down.  8.  DO NOT shower/wash with your normal soap after using and rinsing off  the CHG Soap.                9.  Pat yourself dry with a clean towel.            10.  Wear clean pajamas.            11.  Place clean sheets on your bed the night of your first shower and do not  sleep with pets. Day of Surgery : Do not apply any lotions/deodorants the morning of  surgery.  Please wear clean clothes to the hospital/surgery center.  FAILURE TO FOLLOW THESE INSTRUCTIONS MAY RESULT IN THE CANCELLATION OF YOUR SURGERY PATIENT SIGNATURE_________________________________  NURSE SIGNATURE__________________________________  ________________________________________________________________________

## 2023-10-25 ENCOUNTER — Encounter (HOSPITAL_COMMUNITY)
Admission: RE | Admit: 2023-10-25 | Discharge: 2023-10-25 | Disposition: A | Discharge status: Home / Self Care | Payer: Medicaid Other | Source: Ambulatory Visit

## 2023-10-25 NOTE — Progress Notes (Signed)
 Anesthesia Review:  PCP: Cardiologist :  PPM/ ICD: Device Orders: Rep Notified:  Chest x-ray : EKG : Echo : Stress test: Cardiac Cath :  2008   Activity level:  Sleep Study/ CPAP : Fasting Blood Sugar :      / Checks Blood Sugar -- times a day:    Blood Thinner/ Instructions /Last Dose: ASA / Instructions/ Last Dose :   81 mg aspirin   PT had preo pon 10/25/23 at 0900am.  Called pt and he stated he had talked with someone the other day and was rescheduling his surgery.  Instructed pt to call [facility] and let them be aware.  Called and spoke with Barbara Cower at office and informed her.

## 2023-11-18 NOTE — Progress Notes (Signed)
 COVID Vaccine received:  []  No []  Yes Date of any COVID positive Test in last 90 days:  PCP - [facility] Cardiologist -   Chest x-ray -  EKG -   Stress Test -  ECHO -  Cardiac Cath - 08/23/06 Epic  Bowel Prep - []  No  []   Yes ______  Pacemaker / ICD device []  No []  Yes   Spinal Cord Stimulator:[]  No []  Yes       History of Sleep Apnea? []  No []  Yes   CPAP used?- []  No []  Yes    Does the patient monitor blood sugar?          []  No []  Yes  []  N/A  Patient has: []  NO Hx DM   []  Pre-DM                 []  DM1  []   DM2 Does patient have a [employer] or [employer]? []  No []  Yes   Fasting Blood Sugar Ranges-  Checks Blood Sugar _____ times a day  GLP1 agonist / usual dose -  GLP1 instructions:  SGLT-2 inhibitors / usual dose -  SGLT-2 instructions:   Blood Thinner / Instructions: Aspirin Instructions:  Comments:   Activity level: Patient is able / unable to climb a flight of stairs without difficulty; []  No CP  []  No SOB, but would have ___   Patient can / can not perform ADLs without assistance.   Anesthesia review:   Patient denies shortness of breath, fever, cough and chest pain at PAT appointment.  Patient verbalized understanding and agreement to the Pre-Surgical Instructions that were given to them at this PAT appointment. Patient was also educated of the need to review these PAT instructions again prior to his/her surgery.I reviewed the appropriate phone numbers to call if they have any and questions or concerns.

## 2023-11-18 NOTE — Patient Instructions (Signed)
 SURGICAL WAITING ROOM VISITATION  Patients having surgery or a procedure may have no more than 2 support people in the waiting area - these visitors may rotate.    Children under the age of 74 must have an adult with them who is not the patient.  Due to an increase in RSV and influenza rates and associated hospitalizations, children ages 88 and under may not visit patients in [facility] hospitals.  Visitors with respiratory illnesses are discouraged from visiting and should remain at home.  If the patient needs to stay at the hospital during part of their recovery, the visitor guidelines for inpatient rooms apply. Pre-op nurse will coordinate an appropriate time for 1 support person to accompany patient in pre-op.  This support person may not rotate.    Please refer to the [facility] website for the visitor guidelines for Inpatients (after your surgery is over and you are in a regular room).       Your procedure is scheduled on: 11/26/23   Report to [facility] Main Entrance    Report to admitting at 12:30 PM   Call this number if you have problems the morning of surgery [phone]   Do not eat food  or drink liquids:After Midnight.But may have water to take meds      Oral Hygiene is also important to reduce your risk of infection.                                    Remember - BRUSH YOUR TEETH THE MORNING OF SURGERY WITH YOUR REGULAR TOOTHPASTE  DENTURES WILL BE REMOVED PRIOR TO SURGERY PLEASE DO NOT APPLY "Poly grip" OR ADHESIVES!!!   Stop all vitamins and herbal supplements 7 days before surgery.   Take these medicines the morning of surgery with A SIP OF WATER: cymbalta(duloxetine), Omeprazole(prilosec), Tamsulosin, oxycodone if needed, topamax(topiramate)  Bring CPAP mask and tubing day of surgery.                              You may not have any metal on your body including hair pins, jewelry, and body piercing             Do not wear make-up, lotions,  powders, perfumes/cologne, or deodorant              Men may shave face and neck.   Do not bring valuables to the hospital. [facility] IS NOT             RESPONSIBLE   FOR VALUABLES.   Contacts, glasses, dentures or bridgework may not be worn into surgery.  DO NOT BRING YOUR HOME MEDICATIONS TO THE HOSPITAL. PHARMACY WILL DISPENSE MEDICATIONS LISTED ON YOUR MEDICATION LIST TO YOU DURING YOUR ADMISSION IN THE HOSPITAL!    Patients discharged on the day of surgery will not be allowed to drive home.  Someone NEEDS to stay with you for the first 24 hours after anesthesia.   Special Instructions: Bring a copy of your healthcare power of attorney and living will documents the day of surgery if you haven't scanned them before.              Please read over the following fact sheets you were given: IF YOU HAVE QUESTIONS ABOUT YOUR PRE-OP INSTRUCTIONS PLEASE CALL [phone] [facility]   If you received a COVID test  during your pre-op visit  it is requested that you wear a mask when out in public, stay away from anyone that may not be feeling well and notify your surgeon if you develop symptoms. If you test positive for Covid or have been in contact with anyone that has tested positive in the last 10 days please notify you surgeon.    [place] - Preparing for Surgery Before surgery, you can play an important role.  Because skin is not sterile, your skin needs to be as free of germs as possible.  You can reduce the number of germs on your skin by washing with CHG (chlorahexidine gluconate) soap before surgery.  CHG is an antiseptic cleaner which kills germs and bonds with the skin to continue killing germs even after washing. Please DO NOT use if you have an allergy to CHG or antibacterial soaps.  If your skin becomes reddened/irritated stop using the CHG and inform your nurse when you arrive at Short Stay. Do not shave (including legs and underarms) for at least 48 hours prior to the first CHG  shower.  You may shave your face/neck.  Please follow these instructions carefully:  1.  Shower with CHG Soap the night before surgery and the  morning of surgery.  2.  If you choose to wash your hair, wash your hair first as usual with your normal  shampoo.  3.  After you shampoo, rinse your hair and body thoroughly to remove the shampoo.                             4.  Use CHG as you would any other liquid soap.  You can apply chg directly to the skin and wash.  Gently with a scrungie or clean washcloth.  5.  Apply the CHG Soap to your body ONLY FROM THE NECK DOWN.   Do   not use on face/ open                           Wound or open sores. Avoid contact with eyes, ears mouth and   genitals (private parts).                       Wash face,  Genitals (private parts) with your normal soap.             6.  Wash thoroughly, paying special attention to the area where your    surgery  will be performed.  7.  Thoroughly rinse your body with warm water from the neck down.  8.  DO NOT shower/wash with your normal soap after using and rinsing off the CHG Soap.                9.  Pat yourself dry with a clean towel.            10.  Wear clean pajamas.            11.  Place clean sheets on your bed the night of your first shower and do not  sleep with pets. Day of Surgery : Do not apply any lotions/deodorants the morning of surgery.  Please wear clean clothes to the hospital/surgery center.  FAILURE TO FOLLOW THESE INSTRUCTIONS MAY RESULT IN THE CANCELLATION OF YOUR SURGERY  PATIENT SIGNATURE_________________________________  NURSE SIGNATURE__________________________________  ________________________________________________________________________

## 2023-11-21 ENCOUNTER — Encounter (HOSPITAL_COMMUNITY)
Admission: RE | Admit: 2023-11-21 | Discharge: 2023-11-21 | Disposition: A | Discharge status: Home / Self Care | Source: Ambulatory Visit

## 2023-11-21 DIAGNOSIS — Z01818 Encounter for other preprocedural examination: Secondary | ICD-10-CM

## 2023-11-25 NOTE — Progress Notes (Signed)
 Spoke to patient to see if he was still going to cancel surgery for 11-26-23.  He stated that he is going to cancel but forgot to notify the surgeon's office.  Patient is going to call the office today to advise.

## 2023-11-25 NOTE — H&P (Signed)
 CC/HPI: cc: gross hematuria, hx of urolithiasis   10/18/21: 63 year old man presenting to ED with gross hematuria. He does have a history of urolithiasis and CT at that time did not show any obstructive uropathy or ureteral calculi but did show bilateral nonobstructing stones and 8 mm renal pelvis stone. He has undergone stent placement followed by [facility] in the past. He denies any flank pain. He has a remote tobacco history as a teenager. He does have low back pain for which he takes Percocet. He is noted some intermittent right low back pain but is wondering if is in the kidney area. He denies any family history of kidney or bladder cancer.   11/08/21: Here for cystoscopy; he saw light pink urine a few days ago. He did not leave a urine sample today. He continues to have right flank pain.   12/13/21: 63 year old man who initially presented with gross hematuria found to have an 8 mm left ureteral calculus. He underwent ureteroscopy with laser lithotripsy and stent placement last month. Stent has subsequently been removed. He is doing well overall however he has experienced intermittent right flank pain. He does have nonobstructing right renal calculus on imaging. His urinalysis today does not show any microscopic hematuria.    01/05/22: 63 year old man with a history of urolithiasis 63 year old man with a history of urolithiasis who underwent ureteroscopy with laser lithotripsy on the left here for postop renal ultrasound. Patient is feeling well overall. He has occasional right sided twinges.   09/20/23: 63 year old man with a history of urolithiasis who last underwent a left ureteroscopy in 31-Dec-2021. He has passed stones in the interim without significant difficulty. KUB today shows bilateral stone burden which is increased from renal ultrasound that was performed postoperatively. He does have occasional flank pain.     ALLERGIES: No Known Allergies    MEDICATIONS: Aspirin  81 mg tablet,chewable   Omeprazole  40 mg capsule,delayed release  Percocet 10 mg-325 mg tablet  Tamsulosin  Hcl 0.4 mg capsule  Amlodipine Besylate 10 mg tablet  Duloxetine  Hcl 20 mg capsule,delayed release  Methocarbamol   Vitamin D  Wegovy     GU PSH: Complex Uroflow - 11/08/2021 Cystoscopy - 11/08/2021 Ureteroscopic laser litho - 11/28/2021       PSH Notes: Discectomy,Bone spur rt 2015   NON-GU PSH: Appendectomy Knee replacement     GU PMH: Renal calculus - 01/05/2022, - 12/13/2021, - 11/08/2021, - 10/18/2021 Renal cyst - 01/05/2022 Flank Pain - 12/13/2021 Gross hematuria - 11/08/2021, - 10/18/2021    NON-GU PMH: Encounter for general adult medical examination without abnormal findings, Encounter for preventive health examination Sleep Apnea    FAMILY HISTORY: Calcium kidney stones - Runs in Family   SOCIAL HISTORY: Marital Status: Divorced Preferred Language: English; Ethnicity: Not Hispanic Or Latino; Race: White Current Smoking Status: Patient does not smoke anymore.   Tobacco Use Assessment Completed: Used Tobacco in last 30 days?    REVIEW OF SYSTEMS:    GU Review Male:   Patient denies frequent urination, hard to postpone urination, burning/ pain with urination, get up at night to urinate, leakage of urine, stream starts and stops, trouble starting your stream, have to strain to urinate , erection problems, and penile pain.  Gastrointestinal (Upper):   Patient denies nausea, vomiting, and indigestion/ heartburn.  Gastrointestinal (Lower):   Patient denies diarrhea and constipation.  Constitutional:   Patient denies fever, night sweats, weight loss, and fatigue.  Skin:   Patient denies itching and skin rash/ lesion.  Eyes:  Patient denies blurred vision and double vision.  Ears/ Nose/ Throat:   Patient denies sore throat and sinus problems.  Hematologic/Lymphatic:   Patient denies swollen glands and easy bruising.  Cardiovascular:   Patient denies leg swelling and chest pains.  Respiratory:    Patient denies cough and shortness of breath.  Endocrine:   Patient denies excessive thirst.  Musculoskeletal:   Patient denies back pain and joint pain.  Neurological:   Patient denies headaches and dizziness.  Psychologic:   Patient denies depression and anxiety.   VITAL SIGNS:      09/20/2023 10:43 AM  Weight 290 lb / 131.54 kg  BP 158/94 mmHg  Pulse 90 /min  Temperature 97.7 F / 36.5 C   MULTI-SYSTEM PHYSICAL EXAMINATION:    Constitutional: Well-nourished. No physical deformities. Normally developed. Good grooming.  Neck: Neck symmetrical, not swollen. Normal tracheal position.  Respiratory: No labored breathing, no use of accessory muscles.   Skin: No paleness, no jaundice, no cyanosis. No lesion, no ulcer, no rash.  Neurologic / Psychiatric: Oriented to time, oriented to place, oriented to person. No depression, no anxiety, no agitation.  Eyes: Normal conjunctivae. Normal eyelids.  Ears, Nose, Mouth, and Throat: Left ear no scars, no lesions, no masses. Right ear no scars, no lesions, no masses. Nose no scars, no lesions, no masses. Normal hearing. Normal lips.  Musculoskeletal: Normal gait and station of head and neck.     Complexity of Data:  Records Review:   Previous Patient Records, POC Tool  Urine Test Review:   Urinalysis  X-Ray Review: KUB: Reviewed Films. Discussed With Patient. bilateral calcifications overlaying kidneys consistent with urolithiasis, increased stone burden since last renal US     11/02/21  PSA  Total PSA 1.42 ng/mL    PROCEDURES:         KUB - 74018  A single view of the abdomen is obtained.      .. Patient confirmed No Neulasta OnPro Device.           Visit Complexity - G2211          Urinalysis w/Scope Dipstick Dipstick Cont'd Micro  Color: Yellow Bilirubin: Neg mg/dL WBC/hpf: 0 - 5/hpf  Appearance: Cloudy Ketones: Neg mg/dL RBC/hpf: NS (Not Seen)  Specific Gravity: 1.015 Blood: Neg ery/uL Bacteria: Rare (0-9/hpf)  pH: 7.0 Protein:  1+ mg/dL Cystals: Amorph Phosphates  Glucose: Neg mg/dL Urobilinogen: 0.2 mg/dL Casts: NS (Not Seen)    Nitrites: Neg Trichomonas: Not Present    Leukocyte Esterase: Trace leu/uL Mucous: Present      Epithelial Cells: 0 - 5/hpf      Yeast: NS (Not Seen)      Sperm: Not Present    Notes: Unspun micro due to turbidity.    ASSESSMENT:      ICD-10 Details  1 GU:   Renal calculus - N20.0 Chronic, Stable  2   Flank Pain - R10.84 Chronic, Stable   PLAN:           Document Letter(s):  Created for Patient: Clinical Summary         Notes:   Urolithiasis:  -Patient with increased stone burden seen on KUB today and increased flank pain  -We discussed proceeding with bilateral ureteroscopy with laser lithotripsy and stent placement. Risks and benefits of the procedure were discussed with the patient in general including but not limited to pain, bleeding, infection, ureteral stricture, damage to surrounding structures, need for additional treatment, stent discomfort  -Patient has undergone  ureteroscopy in the past and wishes to proceed with this electively

## 2023-12-06 IMAGING — CT CT ABD-PELV W/ CM
2 of 5 series · 15 of 46 positions shown, 17 images · IV contrast (APPLIED)
Comparison: CT abdomen pelvis 10/02/14

CLINICAL DATA: Hematuria, gross/macroscopic.Bloody urine started
this morning. Says his abdomen feels bloated. Hx of kidney stones.
The stream of his first urination was just blood this morning.

EXAM:
CT ABDOMEN AND PELVIS WITH CONTRAST
TECHNIQUE: Multidetector CT imaging of the abdomen and pelvis was performed
using the standard protocol following bolus administration of
intravenous contrast.

[Series 2: abd pel w · axial · 0.94mm/px · z∈[+710,+1186]mm · 12 of 104 slices shown, 14 images]
[im 6/104  soft-tissue]
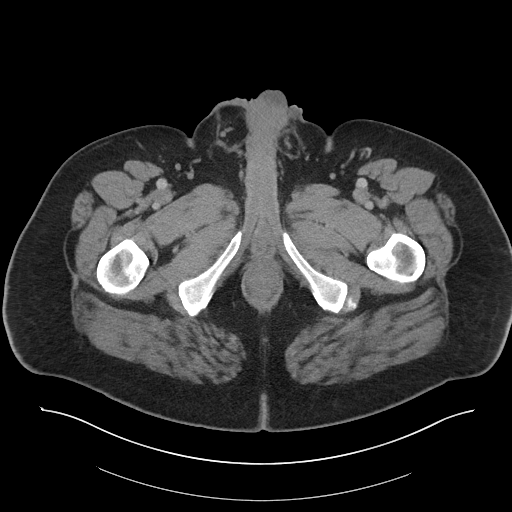
[im 6/104  bone]
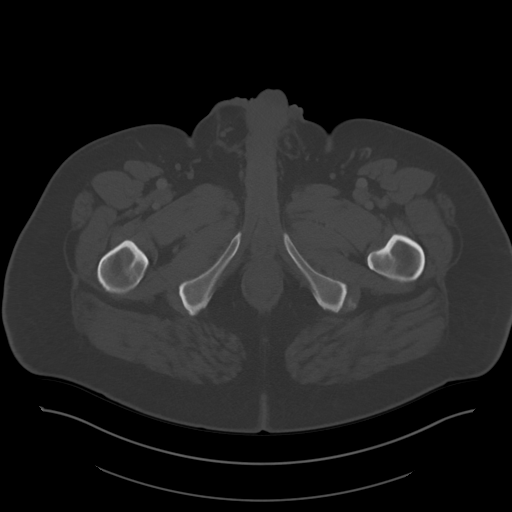
[im 18/104  soft-tissue]
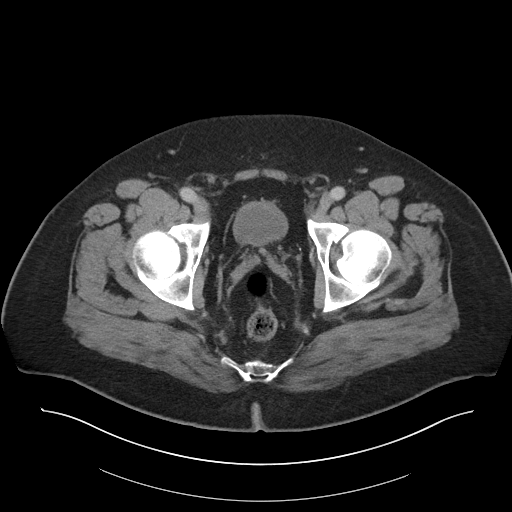
[im 23/104  soft-tissue]
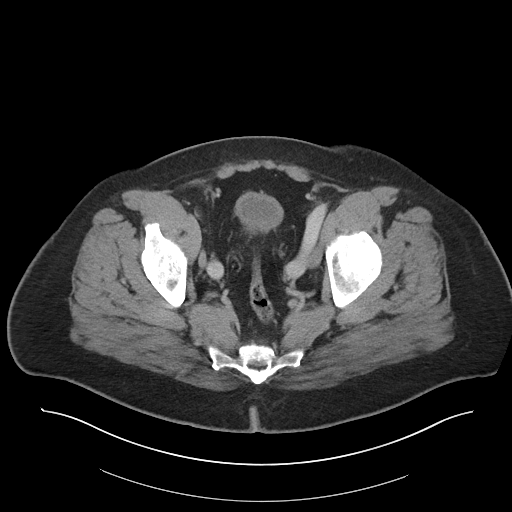
[im 29/104  soft-tissue]
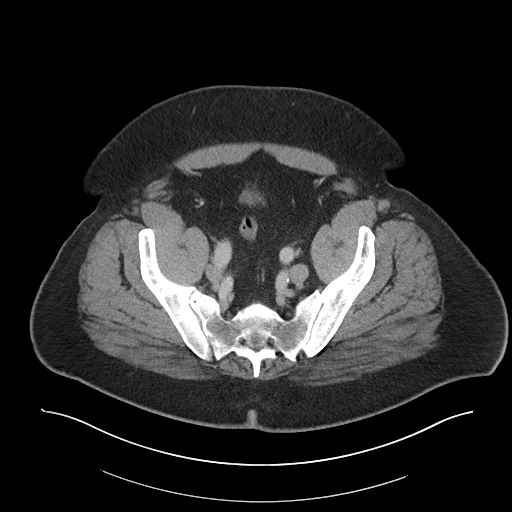
[im 41/104  soft-tissue]
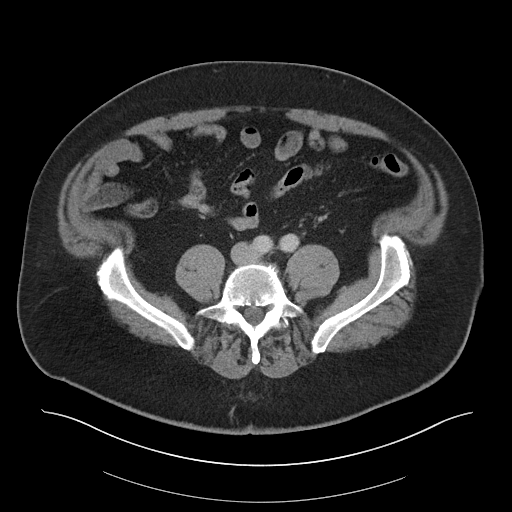
[im 46/104  soft-tissue]
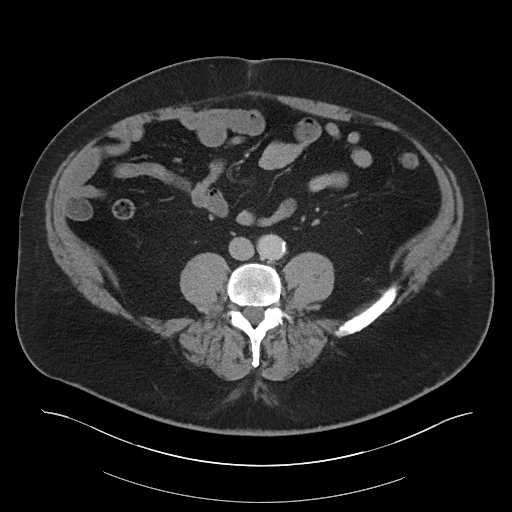
[im 58/104  soft-tissue]
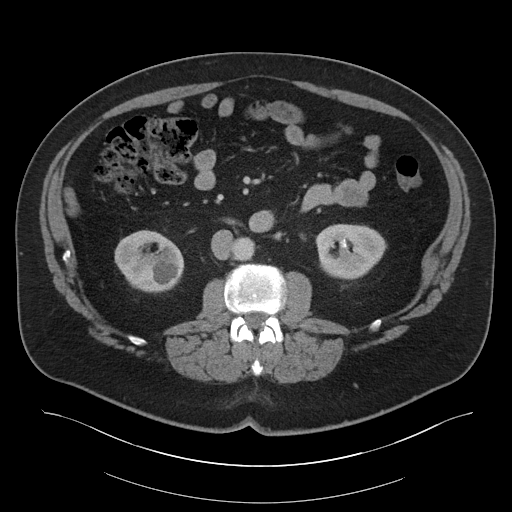
[im 63/104  soft-tissue]
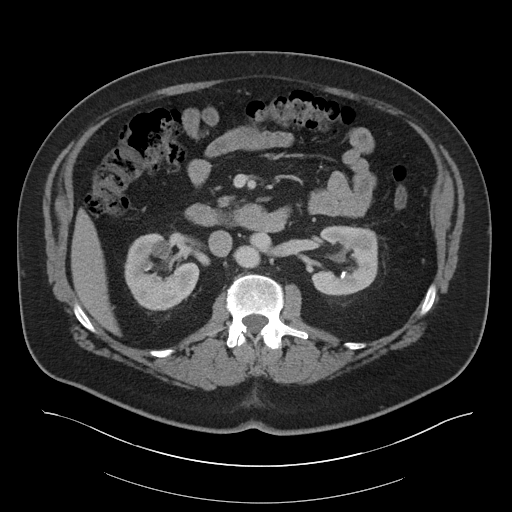
[im 75/104  soft-tissue]
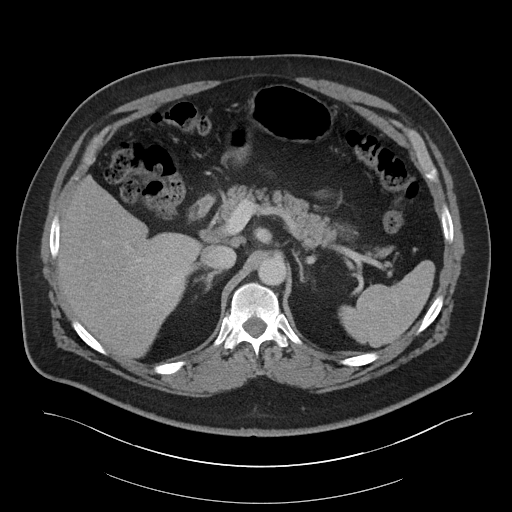
[im 75/104  bone]
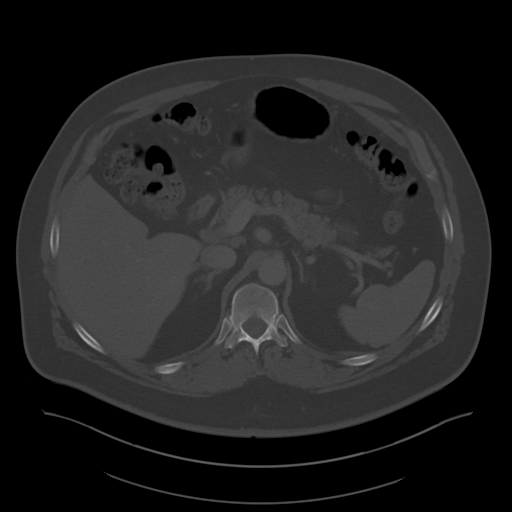
[im 81/104  soft-tissue]
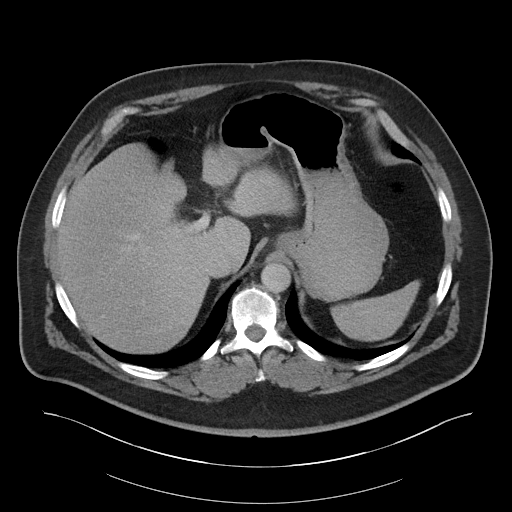
[im 86/104  soft-tissue]
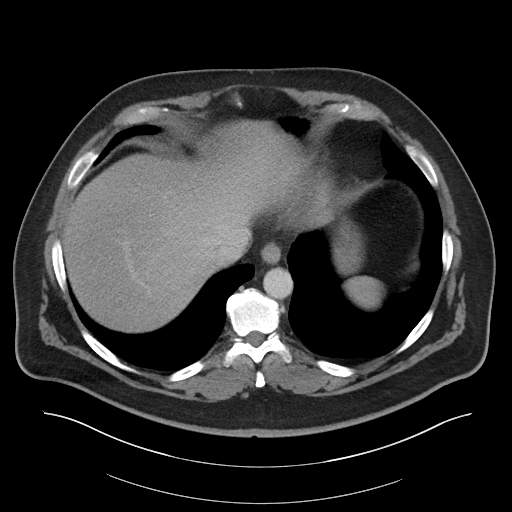
[im 98/104  soft-tissue]
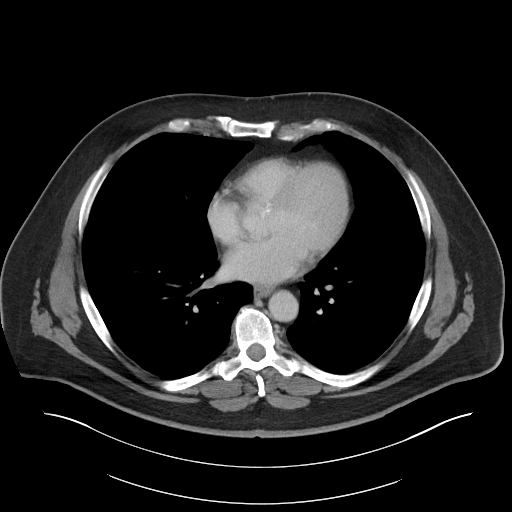

[Series 5: coronal · coronal · 0.85mm/px · 3 of 122 slices shown]
[im 41/122  soft-tissue]
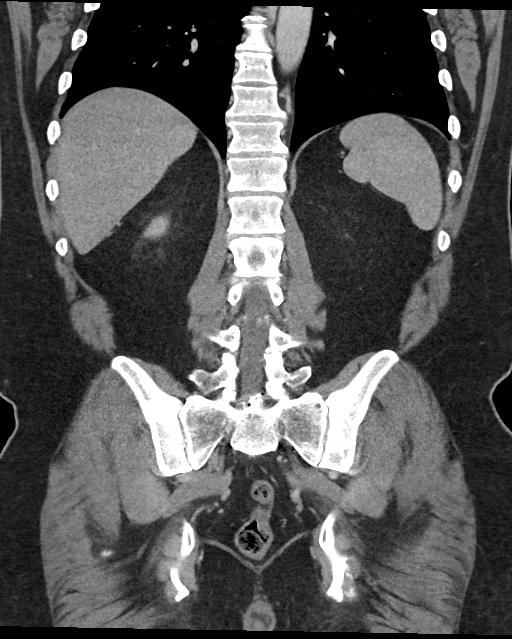
[im 54/122  soft-tissue]
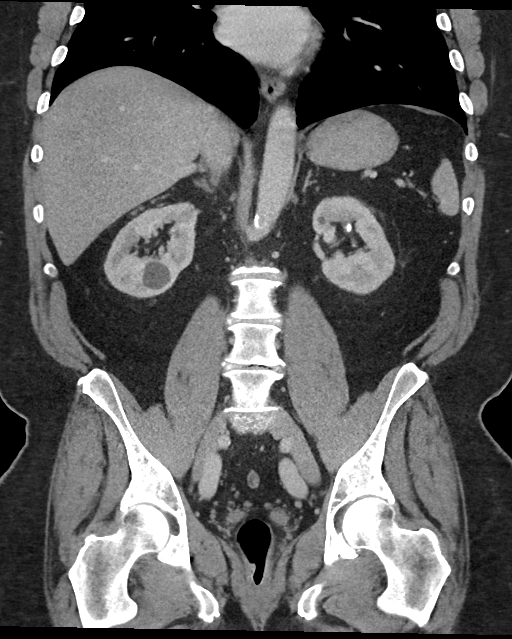
[im 68/122  soft-tissue]
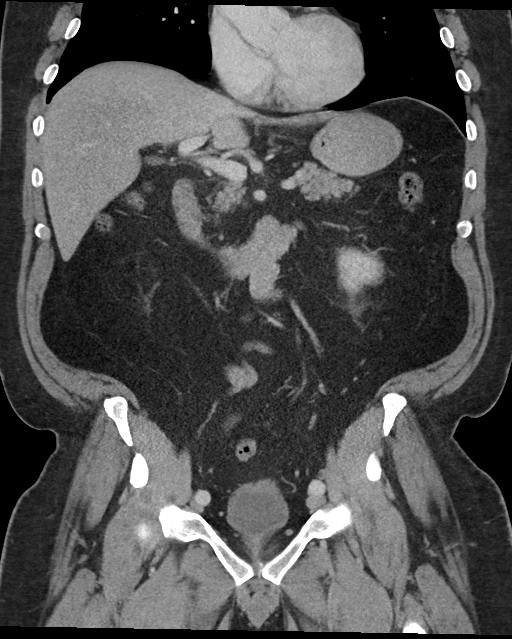

[15 of 46 positions shown; findings below may reference images not displayed]

RADIATION DOSE REDUCTION: This exam was performed according to the
departmental dose-optimization program which includes automated
exposure control, adjustment of the mA and/or kV according to
patient size and/or use of iterative reconstruction technique.

CONTRAST:  100mL OMNIPAQUE IOHEXOL 300 MG/ML  SOLN
FINDINGS: Lower chest: Coronary artery calcifications.

Hepatobiliary: No focal liver abnormality. No gallstones,
gallbladder wall thickening, or pericholecystic fluid. No biliary
dilatation.

Pancreas: No focal lesion. Normal pancreatic contour. No surrounding
inflammatory changes. No main pancreatic ductal dilatation.

Spleen: Normal in size without focal abnormality.

Adrenals/Urinary Tract:

No adrenal nodule bilaterally.

Bilateral kidneys enhance symmetrically. Interval increase in size
of a 2.9 x 2.3 cm (from 2.1 x 1.7cm in 0867) fluid density lesion
within the right kidney that likely represent simple renal cysts.
Subcentimeter hypodensities are too small to characterize.

Bilateral nephrolithiasis measuring up to 3mm on the right and 4mm
on the left. There is an 8mm calcification within the left renal
pelvis. No hydronephrosis. No hydroureter.

The urinary bladder is unremarkable.

Stomach/Bowel: Stomach is within normal limits. No evidence of bowel
wall thickening or dilatation. Few scattered colonic diverticula.
Status post appendectomy.

Vascular/Lymphatic: No abdominal aorta or iliac aneurysm. Mild
atherosclerotic plaque of the aorta and its branches. No abdominal,
pelvic, or inguinal lymphadenopathy.

Reproductive: Prostate is unremarkable.

Other: No intraperitoneal free fluid. No intraperitoneal free gas.
No organized fluid collection.

Musculoskeletal:

No abdominal wall hernia or abnormality.

No suspicious lytic or blastic osseous lesions. No acute displaced
fracture. Multilevel degenerative changes of the spine.
IMPRESSION: 1. Nonobstructive bilateral nephrolithiasis with an 8mm
calcification within the left renal pelvis.
2. Few scattered colonic diverticula with no acute diverticulitis.
3.  Aortic Atherosclerosis (E11LY-360.0).

## 2023-12-12 NOTE — Patient Instructions (Signed)
 SURGICAL WAITING ROOM VISITATION  Patients having surgery or a procedure may have no more than 2 support people in the waiting area - these visitors may rotate.    Children under the age of 56 must have an adult with them who is not the patient.  Due to an increase in RSV and influenza rates and associated hospitalizations, children ages 70 and under may not visit patients in [facility] hospitals.  Visitors with respiratory illnesses are discouraged from visiting and should remain at home.  If the patient needs to stay at the hospital during part of their recovery, the visitor guidelines for inpatient rooms apply. Pre-op nurse will coordinate an appropriate time for 1 support person to accompany patient in pre-op.  This support person may not rotate.    Please refer to the [facility] website for the visitor guidelines for Inpatients (after your surgery is over and you are in a regular room).       Your procedure is scheduled on: 12/17/23   Report to [facility] Main Entrance    Report to admitting at: 5:15 AM   Call this number if you have problems the morning of surgery [phone]   Do not eat food or drink: After Midnight.  FOLLOW ANY ADDITIONAL PRE OP INSTRUCTIONS YOU RECEIVED FROM YOUR SURGEON'S OFFICE!!!   Oral Hygiene is also important to reduce your risk of infection.                                    Remember - BRUSH YOUR TEETH THE MORNING OF SURGERY WITH YOUR REGULAR TOOTHPASTE  DENTURES WILL BE REMOVED PRIOR TO SURGERY PLEASE DO NOT APPLY "Poly grip" OR ADHESIVES!!!   Do NOT smoke after Midnight   Stop all vitamins and herbal supplements 7 days before surgery.   Take these medicines the morning of surgery with A SIP OF WATER : Cymbalta ,omeprazole ,tamsulosin ,Topamax.                              You may not have any metal on your body including hair pins, jewelry, and body piercing             Do not wear lotions, powders, perfumes/cologne, or  deodorant              Men may shave face and neck.   Do not bring valuables to the hospital. [place] IS NOT             RESPONSIBLE   FOR VALUABLES.   Contacts, glasses, dentures or bridgework may not be worn into surgery.   Bring small overnight bag day of surgery.   DO NOT BRING YOUR HOME MEDICATIONS TO THE HOSPITAL. PHARMACY WILL DISPENSE MEDICATIONS LISTED ON YOUR MEDICATION LIST TO YOU DURING YOUR ADMISSION IN THE HOSPITAL!    Patients discharged on the day of surgery will not be allowed to drive home.  Someone NEEDS to stay with you for the first 24 hours after anesthesia.   Special Instructions: Bring a copy of your healthcare power of attorney and living will documents the day of surgery if you haven't scanned them before.              Please read over the following fact sheets you were given: IF YOU HAVE QUESTIONS ABOUT YOUR PRE-OP INSTRUCTIONS PLEASE CALL [phone]   If you received  a COVID test during your pre-op visit  it is requested that you wear a mask when out in public, stay away from anyone that may not be feeling well and notify your surgeon if you develop symptoms. If you test positive for Covid or have been in contact with anyone that has tested positive in the last 10 days please notify you surgeon.    [facility] - Preparing for Surgery Before surgery, you can play an important role.  Because skin is not sterile, your skin needs to be as free of germs as possible.  You can reduce the number of germs on your skin by washing with CHG (chlorahexidine gluconate) soap before surgery.  CHG is an antiseptic cleaner which kills germs and bonds with the skin to continue killing germs even after washing. Please DO NOT use if you have an allergy to CHG or antibacterial soaps.  If your skin becomes reddened/irritated stop using the CHG and inform your nurse when you arrive at Short Stay. Do not shave (including legs and underarms) for at least 48 hours prior to the first  CHG shower.  You may shave your face/neck. Please follow these instructions carefully:  1.  Shower with CHG Soap the night before surgery and the  morning of Surgery.  2.  If you choose to wash your hair, wash your hair first as usual with your  normal  shampoo.  3.  After you shampoo, rinse your hair and body thoroughly to remove the  shampoo.                           4.  Use CHG as you would any other liquid soap.  You can apply chg directly  to the skin and wash                       Gently with a scrungie or clean washcloth.  5.  Apply the CHG Soap to your body ONLY FROM THE NECK DOWN.   Do not use on face/ open                           Wound or open sores. Avoid contact with eyes, ears mouth and genitals (private parts).                       Wash face,  Genitals (private parts) with your normal soap.             6.  Wash thoroughly, paying special attention to the area where your surgery  will be performed.  7.  Thoroughly rinse your body with warm water  from the neck down.  8.  DO NOT shower/wash with your normal soap after using and rinsing off  the CHG Soap.                9.  Pat yourself dry with a clean towel.            10.  Wear clean pajamas.            11.  Place clean sheets on your bed the night of your first shower and do not  sleep with pets. Day of Surgery : Do not apply any lotions/deodorants the morning of surgery.  Please wear clean clothes to the hospital/surgery center.  FAILURE TO FOLLOW THESE INSTRUCTIONS MAY RESULT IN THE CANCELLATION OF  YOUR SURGERY PATIENT SIGNATURE_________________________________  NURSE SIGNATURE__________________________________  ________________________________________________________________________

## 2023-12-13 ENCOUNTER — Encounter (HOSPITAL_COMMUNITY)
Admission: RE | Admit: 2023-12-13 | Discharge: 2023-12-13 | Disposition: A | Discharge status: Home / Self Care | Source: Ambulatory Visit

## 2023-12-13 ENCOUNTER — Encounter (HOSPITAL_COMMUNITY): Payer: Self-pay | Admitting: Physician Assistant

## 2023-12-13 NOTE — Progress Notes (Signed)
 Pt. Did not show to PST appointment.RN was able to contacted pt. Over the phone,he said he still in [place]  and that he is not able to be back to this area yet.Pt. was advised to notify the surgeon about his plan.

## 2023-12-16 NOTE — Progress Notes (Signed)
 Left a message for Adell Hones at Dr. Harman Lightning office regarding patient needing to reschedule surgery that is scheduled for 12-17-23.

## 2023-12-17 ENCOUNTER — Encounter (HOSPITAL_COMMUNITY): Admission: RE | Payer: Self-pay | Source: Home / Self Care

## 2023-12-17 ENCOUNTER — Ambulatory Visit (HOSPITAL_COMMUNITY): Admission: RE | Admit: 2023-12-17 | Payer: Medicaid Other | Source: Home / Self Care | Admitting: Urology

## 2023-12-17 ENCOUNTER — Encounter (HOSPITAL_COMMUNITY): Payer: Self-pay | Admitting: Anesthesiology

## 2023-12-17 SURGERY — CYSTOSCOPY/URETEROSCOPY/HOLMIUM LASER/STENT PLACEMENT
Anesthesia: General | Laterality: Bilateral

## 2024-05-20 ENCOUNTER — Other Ambulatory Visit: Payer: Self-pay | Admitting: Urology

## 2024-05-25 ENCOUNTER — Encounter (HOSPITAL_COMMUNITY): Payer: Self-pay

## 2024-05-25 NOTE — Patient Instructions (Addendum)
 SURGICAL WAITING ROOM VISITATION Patients having surgery or a procedure may have no more than 2 support people in the waiting area - these visitors may rotate.    Children under the age of 15 must have an adult with them who is not the patient.  If the patient needs to stay at the hospital during part of their recovery, the visitor guidelines for inpatient rooms apply. Pre-op nurse will coordinate an appropriate time for 1 support person to accompany patient in pre-op.  This support person may not rotate.    Please refer to the [facility] website for the visitor guidelines for Inpatients (after your surgery is over and you are in a regular room).       Your procedure is scheduled on: 06-02-24   Report to [facility] Main Entrance    Report to admitting at 10:00 AM   Call this number if you have problems the morning of surgery [phone]   Do not eat food or drink liquids :After Midnight.           If you have questions, please contact your surgeon's office.   FOLLOW ANY ADDITIONAL PRE OP INSTRUCTIONS YOU RECEIVED FROM YOUR SURGEON'S OFFICE!!!     Oral Hygiene is also important to reduce your risk of infection.                                    Remember - BRUSH YOUR TEETH THE MORNING OF SURGERY WITH YOUR REGULAR TOOTHPASTE   Do NOT smoke after Midnight   Take these medicines the morning of surgery with A SIP OF WATER :    None  Stop all vitamins and herbal supplements 7 days before surgery                              You may not have any metal on your body including jewelry, and body piercing             Do not wear lotions, powders, cologne, or deodorant              Men may shave face and neck.   Do not bring valuables to the hospital. [place] IS NOT RESPONSIBLE   FOR VALUABLES.   Contacts, dentures or bridgework may not be worn into surgery.  DO NOT BRING YOUR HOME MEDICATIONS TO THE HOSPITAL. PHARMACY WILL DISPENSE MEDICATIONS LISTED ON YOUR  MEDICATION LIST TO YOU DURING YOUR ADMISSION IN THE HOSPITAL!    Patients discharged on the day of surgery will not be allowed to drive home.  Someone NEEDS to stay with you for the first 24 hours after anesthesia.              Please read over the following fact sheets you were given: IF YOU HAVE QUESTIONS ABOUT YOUR PRE-OP INSTRUCTIONS PLEASE CALL [phone] [place]  If you received a COVID test during your pre-op visit  it is requested that you wear a mask when out in public, stay away from anyone that may not be feeling well and notify your surgeon if you develop symptoms. If you test positive for Covid or have been in contact with anyone that has tested positive in the last 10 days please notify you surgeon.  [place] - Preparing for Surgery Before surgery, you can play an important role.  Because skin is not  sterile, your skin needs to be as free of germs as possible.  You can reduce the number of germs on your skin by washing with CHG (chlorahexidine gluconate) soap before surgery.  CHG is an antiseptic cleaner which kills germs and bonds with the skin to continue killing germs even after washing. Please DO NOT use if you have an allergy to CHG or antibacterial soaps.  If your skin becomes reddened/irritated stop using the CHG and inform your nurse when you arrive at Short Stay. Do not shave (including legs and underarms) for at least 48 hours prior to the first CHG shower.  You may shave your face/neck.  Please follow these instructions carefully:  1.  Shower with CHG Soap the night before surgery and the  morning of surgery.  2.  If you choose to wash your hair, wash your hair first as usual with your normal  shampoo.  3.  After you shampoo, rinse your hair and body thoroughly to remove the shampoo.                             4.  Use CHG as you would any other liquid soap.  You can apply chg directly to the skin and wash.  Gently with a scrungie or clean washcloth.  5.  Apply the CHG  Soap to your body ONLY FROM THE NECK DOWN.   Do   not use on face/ open                           Wound or open sores. Avoid contact with eyes, ears mouth and   genitals (private parts).                       Wash face,  Genitals (private parts) with your normal soap.             6.  Wash thoroughly, paying special attention to the area where your    surgery  will be performed.  7.  Thoroughly rinse your body with warm water  from the neck down.  8.  DO NOT shower/wash with your normal soap after using and rinsing off the CHG Soap.                9.  Pat yourself dry with a clean towel.            10.  Wear clean pajamas.            11.  Place clean sheets on your bed the night of your first shower and do not  sleep with pets. Day of Surgery : Do not apply any lotions/deodorants the morning of surgery.  Please wear clean clothes to the hospital/surgery center.  FAILURE TO FOLLOW THESE INSTRUCTIONS MAY RESULT IN THE CANCELLATION OF YOUR SURGERY  PATIENT SIGNATURE_________________________________  NURSE SIGNATURE__________________________________  ________________________________________________________________________

## 2024-05-25 NOTE — Progress Notes (Signed)
  Date of COVID positive in last 90 days: No  PCP - [facility] on [address] (notes requested) Cardiologist -  N/A  Chest x-ray - N/A EKG - 05-26-24 Epic Stress Test - 2008 ECHO - N/A Cardiac Cath - 08-23-06 Epic Pacemaker/ICD device last checked:N/A Spinal Cord Stimulator:N/A  Bowel Prep - N/A  Sleep Study - Yes, +sleep apnea CPAP - Not using currently  Fasting Blood Sugar - N/A Checks Blood Sugar _____ times a day  Last dose of GLP1 agonist-  N/A GLP1 instructions:  Do not take after     Last dose of SGLT-2 inhibitors-  N/A SGLT-2 instructions:  Do not take after    Blood Thinner Instructions: N/A Last dose:   Time: Aspirin  Instructions:N/A Last Dose:  Activity level:  Can go up a flight of stairs and perform activities of daily living without stopping and without symptoms of chest pain or shortness of breath.  Anesthesia review: Eval for chest pain 2024 (no recent episodes of chest pain), hx of cardiac cath, HTN, OSA.  BP elevated at preop, patient not on any meds for BP.  Denies headache, chest pain or SOB.  Patient denies shortness of breath, fever, cough and chest pain at PAT appointment  Patient verbalized understanding of instructions that were given to them at the PAT appointment. Patient was also instructed that they will need to review over the PAT instructions again at home before surgery.

## 2024-05-26 ENCOUNTER — Encounter (HOSPITAL_COMMUNITY)
Admission: RE | Admit: 2024-05-26 | Discharge: 2024-05-26 | Disposition: A | Discharge status: Home / Self Care | Source: Ambulatory Visit | Attending: Urology | Admitting: Urology

## 2024-05-26 ENCOUNTER — Other Ambulatory Visit: Payer: Self-pay

## 2024-05-26 ENCOUNTER — Encounter (HOSPITAL_COMMUNITY): Payer: Self-pay

## 2024-05-26 VITALS — BP 170/92 | HR 74 | Temp 97.9°F | Resp 16 | Ht 76.0 in | Wt 308.0 lb

## 2024-05-26 DIAGNOSIS — G894 Chronic pain syndrome: Secondary | ICD-10-CM | POA: Insufficient documentation

## 2024-05-26 DIAGNOSIS — I10 Essential (primary) hypertension: Secondary | ICD-10-CM | POA: Insufficient documentation

## 2024-05-26 DIAGNOSIS — G4733 Obstructive sleep apnea (adult) (pediatric): Secondary | ICD-10-CM | POA: Diagnosis not present

## 2024-05-26 DIAGNOSIS — Z01810 Encounter for preprocedural cardiovascular examination: Secondary | ICD-10-CM | POA: Diagnosis present

## 2024-05-26 DIAGNOSIS — M1990 Unspecified osteoarthritis, unspecified site: Secondary | ICD-10-CM | POA: Diagnosis not present

## 2024-05-26 DIAGNOSIS — Z01812 Encounter for preprocedural laboratory examination: Secondary | ICD-10-CM | POA: Diagnosis present

## 2024-05-26 DIAGNOSIS — R9431 Abnormal electrocardiogram [ECG] [EKG]: Secondary | ICD-10-CM | POA: Diagnosis not present

## 2024-05-26 DIAGNOSIS — Z87891 Personal history of nicotine dependence: Secondary | ICD-10-CM | POA: Diagnosis not present

## 2024-05-26 DIAGNOSIS — N200 Calculus of kidney: Secondary | ICD-10-CM | POA: Diagnosis not present

## 2024-05-26 DIAGNOSIS — Z01818 Encounter for other preprocedural examination: Secondary | ICD-10-CM | POA: Diagnosis not present

## 2024-05-26 HISTORY — DX: Essential (primary) hypertension: I10

## 2024-05-26 LAB — BASIC METABOLIC PANEL WITH GFR
Anion gap: 9 (ref 5–15)
BUN: 17 mg/dL (ref 8–23)
CO2: 27 mmol/L (ref 22–32)
Calcium: 9.7 mg/dL (ref 8.9–10.3)
Chloride: 105 mmol/L (ref 98–111)
Creatinine, Ser: 1.03 mg/dL (ref 0.61–1.24)
GFR, Estimated: >60 mL/min (ref >60)
Glucose, Bld: 117 mg/dL — ABNORMAL HIGH (ref 70–99)
Potassium: 3.9 mmol/L (ref 3.5–5.1)
Sodium: 142 mmol/L (ref 135–145)

## 2024-05-27 ENCOUNTER — Encounter (HOSPITAL_COMMUNITY): Payer: Self-pay

## 2024-05-27 NOTE — Anesthesia Preprocedure Evaluation (Addendum)
 Anesthesia Evaluation  Patient identified by MRN, date of birth, ID band Patient awake    Reviewed: Allergy & Precautions, NPO status , Patient's Chart, lab work & pertinent test results  History of Anesthesia Complications Negative for: history of anesthetic complications  Airway Mallampati: II       Dental  (+) Teeth Intact, Dental Advisory Given   Pulmonary sleep apnea , former smoker   breath sounds clear to auscultation       Cardiovascular Exercise Tolerance: Good hypertension, (-) CAD and (-) Past MI  Rhythm:Regular Rate:Normal     Neuro/Psych neg Seizures    GI/Hepatic   Endo/Other    Renal/GU      Musculoskeletal  (+) Arthritis ,    Abdominal   Peds  Hematology   Anesthesia Other Findings   Reproductive/Obstetrics                              Anesthesia Physical Anesthesia Plan  ASA: 3  Anesthesia Plan: General   Post-op Pain Management:    Induction: Intravenous  PONV Risk Score and Plan: 1 and Ondansetron , Dexamethasone  and Treatment may vary due to age or medical condition  Airway Management Planned: LMA  Additional Equipment:   Intra-op Plan:   Post-operative Plan: Extubation in OR  Informed Consent:      Dental advisory given  Plan Discussed with: CRNA and Surgeon  Anesthesia Plan Comments: (See PAT note from 10/21. Discussed with patient his poorly controlled HTN. He states he does not need medications because it is only pain related. We discussed the importance of lowering blood pressure in the interim with his Norvasc prescription following surgery. He is at risk for cardiovascular events due to poorly controlled HTN and patient understands his risks. Due to urgency of procedure, will proceed with general anesthesia.)         Anesthesia Quick Evaluation

## 2024-05-27 NOTE — Progress Notes (Signed)
 Case: [id number] Date/Time: 06/02/24 1200   Procedure: CYSTOSCOPY/URETEROSCOPY/HOLMIUM LASER/STENT PLACEMENT (Bilateral) - CYSTOSCOPY/BILATERAL URETEROSCOPY/HOLMIUM LASER/STENT PLACEMENT   Anesthesia type: General   Diagnosis: Kidney stone [N20.0]   Pre-op diagnosis: UPPER TRACT UROLITHIASIS   Location: [facility] PROCEDURE RO[facility] / [facility]   Surgeons: Elisabeth Valli BIRCH, MD       DISCUSSION: Nicholas Horn is a 63 yo with PMH of former smoking, HTN, mild OSA (no CPAP use), arthritis, chronic pain syndrome.  Patient follows with PCP at [facility]. He was last seen in Feb 2025 and stable at that visit. At pre op visit pt reports being off all medications, including BP meds. He was on Amlodipine 10mg  daily. Pt told pre op RN that he would restart medications after surgery. He denied any symptoms and has adequate functional status. He was advised of risk of same day cancellation for uncontrolled BP.  VS: BP (!) 170/92   Pulse 74   Temp 36.6 C (Oral)   Resp 16   Ht 6' 4 (1.93 m)   Wt (!) 139.7 kg   SpO2 97%   BMI 37.49 kg/m   PROVIDERS: [facility]   LABS: Labs reviewed: Acceptable for surgery. (all labs ordered are listed, but only abnormal results are displayed)  Labs Reviewed  BASIC METABOLIC PANEL WITH GFR - Abnormal; Notable for the following components:      Result Value   Glucose, Bld 117 (*)    All other components within normal limits     IMAGES:   EKG 05/26/24  Normal sinus rhythm ST & T wave abnormality, consider lateral ischemia Abnormal ECG When compared with ECG of 03-Oct-2022 15:01, PREVIOUS ECG IS PRESENT No significant change since last tracing  CV:  Past Medical History:  Diagnosis Date   Arthritis    spine   Benign paroxysmal positional vertigo due to bilateral vestibular disorder    neurologist-- dr s. kyung; (11-24-2021  per pt no issue since shown how to do epley maneuver)   Chronic low back pain    Gross hematuria    History of  abnormal electrocardiography 2007   [facility] evalulation for abnornal ST change on ekg at ED in [facility] for Chest pain, by dr degent office note 07-27-2006;  ETT 08-07-2006 positive for sig ST depression, event monitor 01/ 2008 NSR no arrhythmia's,  cardiac cath 08-23-2006 in epic normal coronaries and LVF,  false positive test)   History of COVID-19 07/2020   History of kidney stones    Hypertension    Mild sleep apnea    Does not use CPAP;  sleep study in epic 04-27-2005 pt did not meet criteria for OSA but did desat , was given cpap , per pt used for 5 yrs , cpap broke and has not used since or been retested   Mixed hyperlipidemia    no meds, diet controlled   Nephrolithiasis    CT 10-16-2021  bilateral nonobstrucitve renal calculi   Nocturia more than twice per night    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    per pt has have covid 3 times , first 2 times mild symptoms, last time 12/ 2021 moderate symptoms, recovered at home,  residual taste/ smell loss   Renal calculus, left     Past Surgical History:  Procedure Laterality Date   CARDIAC CATHETERIZATION  08/23/2006   @[facility]  by dr bensimhon;  normal coronaries and lvf;  false positive stress test   COLONOSCOPY  06/02/2018   by  dr prytle   CYSTOSCOPY W/ URETERAL STENT PLACEMENT  2016   in [place]   CYSTOSCOPY WITH RETROGRADE PYELOGRAM, URETEROSCOPY AND STENT PLACEMENT Bilateral 11/28/2021   Procedure: CYSTOSCOPY WITH RETROGRADE PYELOGRAM, URETEROSCOPY AND STENT PLACEMENT;  Surgeon: Elisabeth Valli BIRCH, MD;  Location: [facility];  Service: Urology;  Laterality: Bilateral;  90 MINS   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 Right 07/15/2013   Procedure: Resection right Haglund Deformity;  Surgeon: Jerona LULLA Sage, MD;  Location: [facility] OR;  Service: Orthopedics;  Laterality: Right;  Resection right Haglund Deformity   EXTRACORPOREAL SHOCK WAVE LITHOTRIPSY  2016   HOLMIUM LASER APPLICATION Bilateral  11/28/2021   Procedure: HOLMIUM LASER APPLICATION;  Surgeon: Elisabeth Valli BIRCH, MD;  Location: [facility];  Service: Urology;  Laterality: Bilateral;   KNEE ARTHROSCOPY Left 2014   LAPAROSCOPIC APPENDECTOMY N/A 10/01/2014   Procedure: APPENDECTOMY LAPAROSCOPIC;  Surgeon: Dann Hummer, MD;  [facility];  Service: General;  Laterality: N/A;   LUMBAR DISC SURGERY  2014   L3-4   TOTAL KNEE ARTHROPLASTY Left 01/17/2021   Procedure: LEFT TOTAL KNEE ARTHROPLASTY;  Surgeon: Addie Cordella Hamilton, MD;  Location: [facility] OR;  Service: Orthopedics;  Laterality: Left;   WISDOM TOOTH EXTRACTION     age 68yo    MEDICATIONS:  DULoxetine  (CYMBALTA ) 60 MG capsule   methocarbamol  (ROBAXIN ) 750 MG tablet   omeprazole  (PRILOSEC) 40 MG capsule   oxyCODONE -acetaminophen  (PERCOCET) 10-325 MG tablet   tamsulosin  (FLOMAX ) 0.4 MG CAPS capsule   topiramate (TOPAMAX) 50 MG tablet   No current facility-administered medications for this encounter.   [name] Surgical Short Stay/Anesthesiology [facility] Phone [phone] 05/27/2024 1:51 PM

## 2024-06-02 ENCOUNTER — Ambulatory Visit (HOSPITAL_COMMUNITY): Payer: Self-pay | Admitting: Medical

## 2024-06-02 ENCOUNTER — Ambulatory Visit (HOSPITAL_COMMUNITY)
Admission: RE | Admit: 2024-06-02 | Discharge: 2024-06-02 | Disposition: A | Discharge status: Home / Self Care | Source: Ambulatory Visit | Attending: Urology | Admitting: Urology

## 2024-06-02 ENCOUNTER — Ambulatory Visit (HOSPITAL_COMMUNITY): Admitting: Anesthesiology

## 2024-06-02 ENCOUNTER — Ambulatory Visit (HOSPITAL_COMMUNITY)

## 2024-06-02 ENCOUNTER — Encounter (HOSPITAL_COMMUNITY): Payer: Self-pay | Admitting: Urology

## 2024-06-02 ENCOUNTER — Other Ambulatory Visit: Payer: Self-pay

## 2024-06-02 ENCOUNTER — Encounter (HOSPITAL_COMMUNITY)
Admission: RE | Disposition: A | Discharge status: Home / Self Care | Payer: Self-pay | Source: Ambulatory Visit | Attending: Urology

## 2024-06-02 DIAGNOSIS — I10 Essential (primary) hypertension: Secondary | ICD-10-CM

## 2024-06-02 DIAGNOSIS — N200 Calculus of kidney: Secondary | ICD-10-CM | POA: Diagnosis not present

## 2024-06-02 DIAGNOSIS — G4733 Obstructive sleep apnea (adult) (pediatric): Secondary | ICD-10-CM | POA: Insufficient documentation

## 2024-06-02 DIAGNOSIS — Z87891 Personal history of nicotine dependence: Secondary | ICD-10-CM | POA: Insufficient documentation

## 2024-06-02 HISTORY — PX: CYSTOSCOPY/URETEROSCOPY/HOLMIUM LASER/STENT PLACEMENT: SHX6546

## 2024-06-02 SURGERY — CYSTOSCOPY/URETEROSCOPY/HOLMIUM LASER/STENT PLACEMENT
Anesthesia: General | Laterality: Bilateral

## 2024-06-02 MED ORDER — ACETAMINOPHEN 10 MG/ML IV SOLN: 1000.0000 mg | Freq: Once | INTRAVENOUS | Status: DC | PRN

## 2024-06-02 MED ORDER — LIDOCAINE HCL (PF) 2 % IJ SOLN
INTRAMUSCULAR | Status: AC
  Filled 2024-06-02: qty 5

## 2024-06-02 MED ORDER — FENTANYL CITRATE (PF) 50 MCG/ML IJ SOSY
25.0000 ug | PREFILLED_SYRINGE | INTRAMUSCULAR | Status: DC | PRN
  Administered 2024-06-02 (×2): 50 ug via INTRAVENOUS

## 2024-06-02 MED ORDER — SODIUM CHLORIDE 0.9 % IR SOLN
Status: DC | PRN
  Administered 2024-06-02: 3000 mL via INTRAVESICAL

## 2024-06-02 MED ORDER — INSULIN ASPART 100 UNIT/ML IJ SOLN: 0.0000 [IU] | Freq: Three times a day (TID) | INTRAMUSCULAR | Status: DC

## 2024-06-02 MED ORDER — LIDOCAINE HCL (PF) 2 % IJ SOLN
INTRAMUSCULAR | Status: DC | PRN
  Administered 2024-06-02: 100 mg via INTRADERMAL

## 2024-06-02 MED ORDER — OXYCODONE HCL 5 MG/5ML PO SOLN: 5.0000 mg | Freq: Once | ORAL | Status: AC | PRN

## 2024-06-02 MED ORDER — ONDANSETRON HCL 4 MG/2ML IJ SOLN
INTRAMUSCULAR | Status: DC | PRN
Start: 2024-06-02 — End: 2024-06-02
  Administered 2024-06-02: 4 mg via INTRAVENOUS

## 2024-06-02 MED ORDER — CHLORHEXIDINE GLUCONATE 0.12 % MT SOLN
15.0000 mL | Freq: Once | OROMUCOSAL | Status: AC
  Administered 2024-06-02: 15 mL via OROMUCOSAL

## 2024-06-02 MED ORDER — ORAL CARE MOUTH RINSE: 15.0000 mL | Freq: Once | OROMUCOSAL | Status: AC

## 2024-06-02 MED ORDER — KETOROLAC TROMETHAMINE 30 MG/ML IJ SOLN
INTRAMUSCULAR | Status: AC
  Filled 2024-06-02: qty 1

## 2024-06-02 MED ORDER — OXYCODONE HCL 5 MG PO TABS
ORAL_TABLET | ORAL | Status: AC
  Filled 2024-06-02: qty 1

## 2024-06-02 MED ORDER — ONDANSETRON HCL 4 MG/2ML IJ SOLN
INTRAMUSCULAR | Status: AC
Start: 2024-06-02 — End: 2024-06-02
  Filled 2024-06-02: qty 2

## 2024-06-02 MED ORDER — OXYBUTYNIN CHLORIDE 5 MG PO TABS: 5.0000 mg | ORAL_TABLET | Freq: Once | ORAL | Status: DC

## 2024-06-02 MED ORDER — TAMSULOSIN HCL 0.4 MG PO CAPS: 0.4000 mg | ORAL_CAPSULE | Freq: Every day | ORAL | 0 refills | Status: AC

## 2024-06-02 MED ORDER — ONDANSETRON HCL 4 MG/2ML IJ SOLN: 4.0000 mg | Freq: Once | INTRAMUSCULAR | Status: DC | PRN

## 2024-06-02 MED ORDER — FENTANYL CITRATE (PF) 50 MCG/ML IJ SOSY
PREFILLED_SYRINGE | INTRAMUSCULAR | Status: AC
  Filled 2024-06-02: qty 1

## 2024-06-02 MED ORDER — MIDAZOLAM HCL 5 MG/5ML IJ SOLN
INTRAMUSCULAR | Status: DC | PRN
  Administered 2024-06-02: 2 mg via INTRAVENOUS

## 2024-06-02 MED ORDER — LACTATED RINGERS IV SOLN: INTRAVENOUS | Status: DC

## 2024-06-02 MED ORDER — OXYBUTYNIN CHLORIDE 5 MG PO TABS
ORAL_TABLET | ORAL | Status: AC
Start: 2024-06-02 — End: 2024-06-02
  Filled 2024-06-02: qty 1

## 2024-06-02 MED ORDER — PROPOFOL 10 MG/ML IV BOLUS
INTRAVENOUS | Status: DC | PRN
  Administered 2024-06-02: 50 mg via INTRAVENOUS
  Administered 2024-06-02: 10 mg via INTRAVENOUS
  Administered 2024-06-02: 140 mg via INTRAVENOUS

## 2024-06-02 MED ORDER — DEXAMETHASONE SOD PHOSPHATE PF 10 MG/ML IJ SOLN
INTRAMUSCULAR | Status: DC | PRN
  Administered 2024-06-02: 10 mg via INTRAVENOUS

## 2024-06-02 MED ORDER — CEPHALEXIN 500 MG PO CAPS: 500.0000 mg | ORAL_CAPSULE | Freq: Once | ORAL | 0 refills | Status: AC

## 2024-06-02 MED ORDER — EPHEDRINE SULFATE (PRESSORS) 25 MG/5ML IV SOSY
PREFILLED_SYRINGE | INTRAVENOUS | Status: DC | PRN
  Administered 2024-06-02 (×2): 5 mg via INTRAVENOUS
  Administered 2024-06-02: 10 mg via INTRAVENOUS

## 2024-06-02 MED ORDER — FENTANYL CITRATE (PF) 100 MCG/2ML IJ SOLN
INTRAMUSCULAR | Status: AC
  Filled 2024-06-02: qty 2

## 2024-06-02 MED ORDER — OXYCODONE HCL 5 MG PO TABS
5.0000 mg | ORAL_TABLET | Freq: Once | ORAL | Status: AC | PRN
  Administered 2024-06-02: 5 mg via ORAL

## 2024-06-02 MED ORDER — KETOROLAC TROMETHAMINE 60 MG/2ML IM SOLN
30.0000 mg | Freq: Once | INTRAMUSCULAR | Status: DC
  Filled 2024-06-02: qty 2

## 2024-06-02 MED ORDER — MIDAZOLAM HCL 2 MG/2ML IJ SOLN
INTRAMUSCULAR | Status: AC
  Filled 2024-06-02: qty 2

## 2024-06-02 MED ORDER — EPHEDRINE 5 MG/ML INJ
INTRAVENOUS | Status: AC
  Filled 2024-06-02: qty 5

## 2024-06-02 MED ORDER — OXYCODONE HCL 5 MG PO TABS: 5.0000 mg | ORAL_TABLET | Freq: Three times a day (TID) | ORAL | 0 refills | Status: AC | PRN

## 2024-06-02 MED ORDER — CEFAZOLIN SODIUM-DEXTROSE 3-4 GM/150ML-% IV SOLN
3.0000 g | Freq: Once | INTRAVENOUS | Status: AC
  Administered 2024-06-02: 3 g via INTRAVENOUS
  Filled 2024-06-02: qty 150

## 2024-06-02 MED ORDER — KETOROLAC TROMETHAMINE 30 MG/ML IJ SOLN
30.0000 mg | Freq: Once | INTRAMUSCULAR | Status: AC
  Administered 2024-06-02: 30 mg via INTRAVENOUS

## 2024-06-02 MED ORDER — FENTANYL CITRATE (PF) 100 MCG/2ML IJ SOLN
INTRAMUSCULAR | Status: DC | PRN
  Administered 2024-06-02 (×2): 50 ug via INTRAVENOUS

## 2024-06-02 MED ORDER — DROPERIDOL 2.5 MG/ML IJ SOLN: 0.6250 mg | Freq: Once | INTRAMUSCULAR | Status: DC | PRN

## 2024-06-02 MED ORDER — IOHEXOL 300 MG/ML  SOLN
INTRAMUSCULAR | Status: DC | PRN
  Administered 2024-06-02: 19 mL

## 2024-06-02 SURGICAL SUPPLY — 18 items
BAG URO CATCHER STRL LF (MISCELLANEOUS) ×1 IMPLANT
BASKET ZERO TIP NITINOL 2.4FR (BASKET) IMPLANT
CATH URETL OPEN 5X70 (CATHETERS) ×1 IMPLANT
CLOTH BEACON ORANGE TIMEOUT ST (SAFETY) ×1 IMPLANT
DRSG TEGADERM 2-3/8X2-3/4 SM (GAUZE/BANDAGES/DRESSINGS) IMPLANT
FIBER LASER MOSES 200 DFL (Laser) IMPLANT
GLOVE BIO SURGEON STRL SZ 6.5 (GLOVE) ×1 IMPLANT
GOWN STRL REUS W/ TWL LRG LVL3 (GOWN DISPOSABLE) ×1 IMPLANT
GUIDEWIRE STR DUAL SENSOR (WIRE) ×1 IMPLANT
KIT TURNOVER KIT A (KITS) ×1 IMPLANT
MANIFOLD NEPTUNE II (INSTRUMENTS) ×1 IMPLANT
PACK CYSTO (CUSTOM PROCEDURE TRAY) ×1 IMPLANT
SHEATH NAVIGATOR HD 11/13X28 (SHEATH) IMPLANT
SHEATH NAVIGATOR HD 11/13X36 (SHEATH) IMPLANT
STENT URET 6FRX28 CONTOUR (STENTS) IMPLANT
TRACTIP FLEXIVA PULS ID 200XHI (Laser) IMPLANT
TUBING CONNECTING 10 (TUBING) ×1 IMPLANT
TUBING UROLOGY SET (TUBING) ×1 IMPLANT

## 2024-06-02 NOTE — Anesthesia Procedure Notes (Signed)
 Procedure Name: LMA Insertion Date/Time: 06/02/2024 11:00 AM  Performed by: Carleton Garnette SAUNDERS, CRNAPre-anesthesia Checklist: Emergency Drugs available, Patient identified, Suction available, Patient being monitored and Timeout performed Patient Re-evaluated:Patient Re-evaluated prior to induction Oxygen Delivery Method: Circle system utilized Preoxygenation: Pre-oxygenation with 100% oxygen Induction Type: IV induction LMA: LMA inserted LMA Size: 5.0 Tube type: Oral Number of attempts: 1 Placement Confirmation: positive ETCO2 and breath sounds checked- equal and bilateral Tube secured with: Tape Dental Injury: Teeth and Oropharynx as per pre-operative assessment

## 2024-06-02 NOTE — Transfer of Care (Signed)
 Immediate Anesthesia Transfer of Care Note  Patient: Nicholas Horn  Procedure(s) Performed: CYSTOSCOPY/URETEROSCOPY/HOLMIUM LASER/STENT PLACEMENT (Bilateral)  Patient Location: PACU  Anesthesia Type:General  Level of Consciousness: sedated  Airway & Oxygen Therapy: Patient Spontanous Breathing and Patient connected to face mask oxygen  Post-op Assessment: Report given to RN and Post -op Vital signs reviewed and stable  Post vital signs: Reviewed and stable  Last Vitals:  Vitals Value Taken Time  BP    Temp    Pulse 75 06/02/24 12:16  Resp    SpO2 96 % 06/02/24 12:16  Vitals shown include unfiled device data.  Last Pain:  Vitals:   06/02/24 1034  TempSrc: Oral  PainSc:       Patients Stated Pain Goal: 5 (06/02/24 1024)  Complications: No notable events documented.

## 2024-06-02 NOTE — H&P (Signed)
 History of present illness: 63 yo man with history of urolithiasis and increased stone burden on KUB here for bilateral ureteroscopy with laser lithotripsy and stent placement.   Review of systems: A 12 point comprehensive review of systems was obtained and is negative unless otherwise stated in the history of present illness.  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   Current Outpatient Medications on File Prior to Encounter  Medication Sig Dispense Refill   DULoxetine  (CYMBALTA ) 60 MG capsule Take 60 mg by mouth daily. (Patient not taking: Reported on 05/26/2024)     methocarbamol  (ROBAXIN ) 750 MG tablet Take 750 mg by mouth every 8 (eight) hours as needed for muscle spasms. (Patient not taking: Reported on 05/26/2024)     omeprazole  (PRILOSEC) 40 MG capsule Take 40 mg by mouth daily as needed (acid reflux). (Patient not taking: Reported on 05/26/2024)     oxyCODONE -acetaminophen  (PERCOCET) 10-325 MG tablet Take 1-2 tablets by mouth every 4 (four) hours as needed for pain. (Patient not taking: Reported on 05/26/2024)     tamsulosin  (FLOMAX ) 0.4 MG CAPS capsule Take 1 capsule (0.4 mg total) by mouth at bedtime. (Patient not taking: Reported on 05/26/2024) 30 capsule 0   topiramate (TOPAMAX) 50 MG tablet Take 50 mg by mouth daily. (Patient not taking: Reported on 05/26/2024)       Past Medical History:  Diagnosis Date   Arthritis    spine   Benign paroxysmal positional vertigo due to bilateral vestibular disorder    neurologist-- dr s. kyung; (11-24-2021  per pt no issue since shown how to do epley maneuver)   Chronic low back pain    Gross hematuria    History of abnormal electrocardiography 2007   [place] [facility] evalulation for abnornal ST change on ekg at ED in [facility] for Chest pain, by dr degent office note 07-27-2006;  ETT 08-07-2006 positive for sig ST depression, event monitor 01/ 2008 NSR no arrhythmia's,  cardiac cath 08-23-2006 in epic normal coronaries and LVF,  false positive test)   History of COVID-19 07/2020   History of kidney stones    Hypertension    Mild sleep apnea    Does not use CPAP;  sleep study in epic 04-27-2005 pt did not meet criteria for OSA but did desat , was given cpap , per pt used for 5 yrs , cpap broke and has not used since or been retested   Mixed hyperlipidemia    no meds, diet controlled   Nephrolithiasis    CT 10-16-2021  bilateral nonobstrucitve renal calculi   Nocturia more than twice per night    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    per pt has have covid 3 times , first 2 times mild symptoms, last time 12/ 2021 moderate symptoms, recovered at home,  residual taste/ smell loss   Renal calculus,  left     Past Surgical History:  Procedure Laterality Date   CARDIAC CATHETERIZATION  08/23/2006   @[facility]  by dr bensimhon;  normal coronaries and lvf;  false positive stress test   COLONOSCOPY  06/02/2018   by dr prytle   CYSTOSCOPY W/ URETERAL STENT PLACEMENT  2016   in [place]   CYSTOSCOPY WITH RETROGRADE PYELOGRAM, URETEROSCOPY AND STENT PLACEMENT Bilateral 11/28/2021   Procedure: CYSTOSCOPY WITH RETROGRADE PYELOGRAM, URETEROSCOPY AND STENT PLACEMENT;  Surgeon: Elisabeth Valli BIRCH, MD;  Location: [facility];  Service: Urology;  Laterality: Bilateral;  90 MINS   EXCISION HAGLUND'S  DEFORMITY WITH ACHILLES TENDON REPAIR Right 07/15/2013   Procedure: Resection right Haglund Deformity;  Surgeon: Jerona LULLA Sage, MD;  Location: [facility] OR;  Service: Orthopedics;  Laterality: Right;  Resection right Haglund Deformity   EXTRACORPOREAL SHOCK WAVE LITHOTRIPSY  2016   HOLMIUM LASER APPLICATION Bilateral 11/28/2021   Procedure: HOLMIUM LASER APPLICATION;  Surgeon: Elisabeth Valli BIRCH, MD;  Location: [facility];  Service: Urology;  Laterality: Bilateral;   KNEE ARTHROSCOPY Left 2014   LAPAROSCOPIC APPENDECTOMY N/A 10/01/2014   Procedure: APPENDECTOMY LAPAROSCOPIC;  Surgeon: Dann Hummer, MD;  Location: [facility] OR;  Service: General;  Laterality: N/A;   LUMBAR DISC SURGERY  2014   L3-4   TOTAL KNEE ARTHROPLASTY Left 01/17/2021   Procedure: LEFT TOTAL KNEE ARTHROPLASTY;  Surgeon: Addie Cordella Hamilton, MD;  Location: [facility] OR;  Service: Orthopedics;  Laterality: Left;   WISDOM TOOTH EXTRACTION     age 58yo    Social History   Tobacco Use   Smoking status: Former    Current packs/day: 0.00    Types: Cigarettes    Start date: 09/06/1985    Quit date: 1997    Years since quitting: 28.8   Smokeless tobacco: Never  Vaping Use   Vaping status: Never Used  Substance Use Topics   Alcohol use: No   Drug use: Never    Family History  Problem Relation Age of Onset   Heart disease Mother 6       died of heart disease, age 20yo   Obesity Mother    Hypertension Mother    Hyperlipidemia Mother    Cancer Father        died of stomach and intestinal cancer   Heart disease Brother        CAD, stent   Other Brother        substance abuse   Diabetes Neg Hx    Stroke Neg Hx    Colon cancer Neg Hx     PE: There were no vitals filed for this visit. Patient appears to be in no acute distress  patient is alert and oriented x3 Atraumatic normocephalic head No increased work of breathing, no audible wheezes/rhonchi Lower extremities are symmetric without appreciable  edema Grossly neurologically intact No identifiable skin lesions  No results for input(s): WBC, HGB, HCT in the last 72 hours. No results for input(s): NA, K, CL, CO2, GLUCOSE, BUN, CREATININE, CALCIUM in the last 72 hours. No results for input(s): LABPT, INR in the last 72 hours. No results for input(s): LABURIN in the last 72 hours. Results for orders placed or performed during the hospital encounter of 10/16/21  Urine Culture     Status: None   Collection Time: 10/16/21  9:53 PM   Specimen: Urine, Clean Catch  Result Value Ref Range Status   Specimen Description   Final    URINE, CLEAN CATCH  Performed at [facility], [address], [place], [place] [address]    Special Requests   Final    NONE Performed at [facility], [address] [place], [place] [address]    Culture   Final    NO GROWTH Performed at [facility], [address], [place], [place] [address]    Report Status 10/18/2021 FINAL  Final    Imaging: KUB reviewed from February 2025  Assessment/Plan: Urolithiasis: - Risks and benefits of bilateral ureteroscopy with laser lithotripsy and stent placement discussed with patient including but not limited to pain, bleeding, infection, stent discomfort, damage to surrounding structures, ureteral stricture, need for additional procedure/staged procedure. - Informed consent obtained  Surgery scheduled for today   Caleb Prigmore D Lytle [place]

## 2024-06-02 NOTE — Op Note (Signed)
 Preoperative diagnosis: bilateral renal calculus  Postoperative diagnosis: bilateral renal calculus  Procedure:  Cystoscopy bilateral ureteroscopy, laser lithotripsy, basket stone extraction bilateral 26F x 28cm ureteral stent placement - with strings bilateral retrograde pyelography with interpretation  Surgeon: Valli Shank, MD  Anesthesia: General  Complications: None  Intraoperative findings:  Normal urethra Bilateral lobe hypertrophy prostatic urethra Bilateral orthotropic ureteral orifices Right retrograde pyelogram showed normal caliber ureter with a filling defect in the renal pelvis consistent with known stone Left retrograde pyelogram normal without filling defect Bladder mucosa normal without masses   EBL: Minimal  Specimens: bilateral renal calculi  Disposition of specimens: [facility] for stone analysis  Indication: Nicholas Horn is a 63 y.o.   patient with a  bilateral renal stones and associated bilateral symptoms. After reviewing the management options for treatment, the patient elected to proceed with the above surgical procedure(s). We have discussed the potential benefits and risks of the procedure, side effects of the proposed treatment, the likelihood of the patient achieving the goals of the procedure, and any potential problems that might occur during the procedure or recuperation. Informed consent has been obtained.   Description of procedure:  The patient was taken to the operating room and general anesthesia was induced.  The patient was placed in the dorsal lithotomy position, prepped and draped in the usual sterile fashion, and preoperative antibiotics were administered. A preoperative time-out was performed.   Cystourethroscopy was performed.  The patient's urethra was examined and was demonstrated bilobar prostatic hypertrophy.The bladder was then systematically examined in its entirety. There was no evidence for any bladder  tumors, stones, or other mucosal pathology.    Attention then turned to the right ureteral orifice and a ureteral catheter was used to intubate the ureteral orifice.  Omnipaque  contrast was injected through the ureteral catheter and a retrograde pyelogram was performed with findings as dictated above.  A 0.38 sensor guidewire was then advanced up the right ureter into the renal pelvis under fluoroscopic guidance.  A second sensor wire was advanced alongside the first sensor wire and up to the kidney with fluoroscopic guidance.  1 wire was secured as a safety wire.  The ureteral access sheath was advanced over the second wire into the proximal ureter with fluoroscopy.  The inner sheath and wire removed.  Flexible ureteroscopy took place and the known renal pelvis calculus was identified.  The stone was then fragmented with the 242 micron holmium laser fiber. All larger stone fragments were then removed from the ureter with a 0 tip basket.  Reinspection of the ureter revealed no remaining visible stones or fragments.   The wire was then backloaded through the cystoscope and a ureteral stent was advance over the wire using Seldinger technique.  The stent was positioned appropriately under fluoroscopic and cystoscopic guidance.  The wire was then removed with an adequate stent curl noted in the renal pelvis as well as in the bladder.  Attention then turned to the left side.  Retrograde pyelogram, wire placement, ureteral access sheath placement and ureteroscopy took place in the similar manner.  There was smaller calculi seen in the upper pole.  This was fragmented and then the larger fragments were removed with a ZeroTip basket.  Again the access sheath was removed in unison with the ureteroscope taking care to examine the ureter on the the way out.  The wire was again backloaded through the cystoscope and a ureteral stent was placed.  The tether's were left on the  stents.  The bladder was then emptied and  the procedure ended.  The patient appeared to tolerate the procedure well and without complications.  The patient was able to be awakened and transferred to the recovery unit in satisfactory condition.   Disposition: The tether of the stent was left on and secured to the ventral aspect of the patient's penis. Instructions for removing the stent have been provided to the patient.

## 2024-06-02 NOTE — Discharge Instructions (Addendum)
 DISCHARGE INSTRUCTIONS FOR KIDNEY STONE/URETERAL STENT   MEDICATIONS:  1. Resume all your other meds from home  2. AZO over the counter can help with the burning/stinging when you urinate. 3. Tramadol  is for moderate/severe pain, otherwise taking up to 1000 mg every 6 hours of plainTylenol will help treat your pain.   4. Take Cephalexin  one hour prior to removal of your stent.    ACTIVITY:  1. No strenuous activity x 1week  2. No driving while on narcotic pain medications  3. Drink plenty of water   4. Continue to walk at home - you can still get blood clots when you are at home, so keep active, but don't over do it.  5. May return to work/school tomorrow or when you feel ready   BATHING:  1. You can shower and we recommend daily showers  2. You have a string coming from your urethra: The stent string is attached to your ureteral stent. Do not pull on this.   SIGNS/SYMPTOMS TO CALL:  Please call us  if you have a fever greater than 101.5, uncontrolled nausea/vomiting, uncontrolled pain, dizziness, unable to urinate, bloody urine, chest pain, shortness of breath, leg swelling, leg pain, redness around wound, drainage from wound, or any other concerns or questions.   You can reach us  at [phone].   FOLLOW-UP:  1. You have a string attached to your stent, you may remove it on Friday, October 31st. To do this, pull the strings until the stents are completely removed. You may feel an odd sensation in your back.  Post Anesthesia Home Care Instructions  Activity: Get plenty of rest for the remainder of the day. A responsible adult should stay with you for 24 hours following the procedure.  For the next 24 hours, DO NOT: -Drive a car -Advertising copywriter -Drink alcoholic beverages -Take any medication unless instructed by your physician -Make any legal decisions or sign important papers.  Meals: Start with liquid foods such as gelatin or soup. Progress to regular foods as tolerated.  Avoid greasy, spicy, heavy foods. If nausea and/or vomiting occur, drink only clear liquids until the nausea and/or vomiting subsides. Call your physician if vomiting continues.  Special Instructions/Symptoms: Your throat may feel dry or sore from the anesthesia or the breathing tube placed in your throat during surgery. If this causes discomfort, gargle with warm salt water . The discomfort should disappear within 24 hours.  If you had a scopolamine patch placed behind your ear for the management of post- operative nausea and/or vomiting:  1. The medication in the patch is effective for 72 hours, after which it should be removed.  Wrap patch in a tissue and discard in the trash. Wash hands thoroughly with soap and water . 2. You may remove the patch earlier than 72 hours if you experience unpleasant side effects which may include dry mouth, dizziness or visual disturbances. 3. Avoid touching the patch. Wash your hands with soap and water  after contact with the patch.

## 2024-06-02 NOTE — Anesthesia Postprocedure Evaluation (Signed)
 Anesthesia Post Note  Patient: Nicholas Horn  Procedure(s) Performed: CYSTOSCOPY/URETEROSCOPY/HOLMIUM LASER/STENT PLACEMENT (Bilateral)     Patient location during evaluation: PACU Anesthesia Type: General Level of consciousness: awake Pain management: pain level controlled Vital Signs Assessment: post-procedure vital signs reviewed and stable Respiratory status: spontaneous breathing Cardiovascular status: blood pressure returned to baseline Postop Assessment: no apparent nausea or vomiting Anesthetic complications: no   No notable events documented.  Last Vitals:  Vitals:   06/02/24 1314 06/02/24 1400  BP:  (!) 162/103  Pulse: 64 65  Resp: 16 16  Temp: 36.4 C   SpO2: 97% 97%    Last Pain:  Vitals:   06/02/24 1400  TempSrc:   PainSc: 4                  Lauraine KATHEE [place]

## 2024-06-03 ENCOUNTER — Encounter (HOSPITAL_COMMUNITY): Payer: Self-pay | Admitting: Urology
